# Patient Record
Sex: Female | Born: 1995 | Race: White | Hispanic: No | Smoking: Former smoker
Health system: Southern US, Community
[De-identification: ages and names within clinical notes are randomized; demographics above are authoritative.]

## PROBLEM LIST (undated history)

## (undated) ENCOUNTER — Inpatient Hospital Stay (HOSPITAL_COMMUNITY): Payer: Self-pay

## (undated) ENCOUNTER — Ambulatory Visit (HOSPITAL_COMMUNITY)
Admission: EM | Disposition: A | Discharge status: Other Acute Inpatient Hospital | Payer: PRIVATE HEALTH INSURANCE | Source: Ambulatory Visit

## (undated) DIAGNOSIS — F419 Anxiety disorder, unspecified: Secondary | ICD-10-CM

## (undated) DIAGNOSIS — F32A Depression, unspecified: Secondary | ICD-10-CM

## (undated) DIAGNOSIS — R519 Headache, unspecified: Secondary | ICD-10-CM

## (undated) DIAGNOSIS — D649 Anemia, unspecified: Secondary | ICD-10-CM

## (undated) DIAGNOSIS — F909 Attention-deficit hyperactivity disorder, unspecified type: Secondary | ICD-10-CM

## (undated) DIAGNOSIS — F319 Bipolar disorder, unspecified: Secondary | ICD-10-CM

## (undated) DIAGNOSIS — F329 Major depressive disorder, single episode, unspecified: Secondary | ICD-10-CM

## (undated) HISTORY — PX: ADENOIDECTOMY: SUR15

## (undated) HISTORY — PX: TONGUE SURGERY: SHX810

## (undated) HISTORY — PX: TONSILLECTOMY: SUR1361

---
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
## 2017-02-14 ENCOUNTER — Encounter (HOSPITAL_COMMUNITY): Payer: Self-pay | Admitting: Emergency Medicine

## 2017-02-14 ENCOUNTER — Emergency Department (HOSPITAL_COMMUNITY)
Admission: EM | Admit: 2017-02-14 | Discharge: 2017-02-14 | Disposition: A | Discharge status: Home / Self Care | Payer: No Typology Code available for payment source | Attending: Emergency Medicine | Admitting: Emergency Medicine

## 2017-02-14 DIAGNOSIS — N939 Abnormal uterine and vaginal bleeding, unspecified: Secondary | ICD-10-CM

## 2017-02-14 DIAGNOSIS — N898 Other specified noninflammatory disorders of vagina: Secondary | ICD-10-CM | POA: Diagnosis present

## 2017-02-14 DIAGNOSIS — B9689 Other specified bacterial agents as the cause of diseases classified elsewhere: Secondary | ICD-10-CM | POA: Diagnosis not present

## 2017-02-14 DIAGNOSIS — N760 Acute vaginitis: Secondary | ICD-10-CM | POA: Insufficient documentation

## 2017-02-14 DIAGNOSIS — F17200 Nicotine dependence, unspecified, uncomplicated: Secondary | ICD-10-CM | POA: Insufficient documentation

## 2017-02-14 DIAGNOSIS — N938 Other specified abnormal uterine and vaginal bleeding: Secondary | ICD-10-CM | POA: Insufficient documentation

## 2017-02-14 LAB — BASIC METABOLIC PANEL
ANION GAP: 9 (ref 5–15)
BUN: 8 mg/dL (ref 6–20)
CALCIUM: 9.9 mg/dL (ref 8.9–10.3)
CHLORIDE: 106 mmol/L (ref 101–111)
CO2: 25 mmol/L (ref 22–32)
Creatinine, Ser: 0.67 mg/dL (ref 0.44–1.00)
GFR calc Af Amer: >60 mL/min (ref >60)
GFR calc non Af Amer: >60 mL/min (ref >60)
GLUCOSE: 84 mg/dL (ref 65–99)
Potassium: 4.2 mmol/L (ref 3.5–5.1)
Sodium: 140 mmol/L (ref 135–145)

## 2017-02-14 LAB — URINALYSIS, ROUTINE W REFLEX MICROSCOPIC
BACTERIA UA: NONE SEEN
Bilirubin Urine: NEGATIVE
Glucose, UA: NEGATIVE mg/dL
KETONES UR: NEGATIVE mg/dL
Nitrite: NEGATIVE
Protein, ur: NEGATIVE mg/dL
Specific Gravity, Urine: 1.021 (ref 1.005–1.030)
pH: 5 (ref 5.0–8.0)

## 2017-02-14 LAB — WET PREP, GENITAL
Sperm: NONE SEEN
Trich, Wet Prep: NONE SEEN
Yeast Wet Prep HPF POC: NONE SEEN

## 2017-02-14 LAB — CBC
HEMATOCRIT: 38.3 % (ref 36.0–46.0)
HEMOGLOBIN: 12.9 g/dL (ref 12.0–15.0)
MCH: 29.4 pg (ref 26.0–34.0)
MCHC: 33.7 g/dL (ref 30.0–36.0)
MCV: 87.2 fL (ref 78.0–100.0)
Platelets: 245 10*3/uL (ref 150–400)
RBC: 4.39 MIL/uL (ref 3.87–5.11)
RDW: 13.1 % (ref 11.5–15.5)
WBC: 4.8 10*3/uL (ref 4.0–10.5)

## 2017-02-14 LAB — POC URINE PREG, ED: PREG TEST UR: NEGATIVE

## 2017-02-14 MED ORDER — METRONIDAZOLE 500 MG PO TABS: 500.0000 mg | ORAL_TABLET | Freq: Two times a day (BID) | ORAL | 0 refills | Status: DC

## 2017-02-14 NOTE — ED Provider Notes (Signed)
[facility]-EMERGENCY DEPT Provider Note   CSN: [id number] Arrival date & time: 02/14/17  1225  By signing my name below, I, Teofilo Pod, attest that this documentation has been prepared under the direction and in the presence of Melburn Hake, [place]. Electronically Signed: Teofilo Pod, ED Scribe. 02/14/2017. 3:11 PM.    History   Chief Complaint Chief Complaint  Patient presents with  . Vaginal Discharge  . Dysuria   The history is provided by the patient. No language interpreter was used.   HPI Comments:  Nicole Harris is a 21 y.o. female who presents to the Emergency Department complaining of a intermittent vaginal bleeding that began today. Pt states that 3 days ago she was having a malodorous vaginal discharge which has since resolved, then she had mild vaginal pain 2 days ago which has resolved. Pt states that she only sees blood when she urinates but reports having mild spotting today. Pt saw her PCP yesterday, had a pelvic exam and UA and was told that she had white blood cells and blood in her urine. Pt is sexually active with 1 partner, and does not use condoms. LNMP began 01/28/2017. Pt takes birth control pill. No alleviating factors noted for current symptoms. Pt denies vaginal pain, fever, chest pain, abdominal pain, nausea, vomiting, diarrhea, constipation, dysuria.   Past Medical History:  Diagnosis Date  . ADHD (attention deficit hyperactivity disorder)   . Anemia   . Anxiety   . Depression     There are no active problems to display for this patient.   Past Surgical History:  Procedure Laterality Date  . ADENOIDECTOMY    . TONGUE SURGERY    . TONSILLECTOMY      OB History    No data available       Home Medications    Prior to Admission medications   Medication Sig Start Date End Date Taking? Authorizing Provider  cholecalciferol (VITAMIN D) 1000 UNITS tablet Take 1,000 Units by mouth daily.    Historical Provider, MD  ferrous sulfate 325  (65 FE) MG tablet Take 325 mg by mouth daily with breakfast.     Historical Provider, MD  ibuprofen (ADVIL,MOTRIN) 200 MG tablet Take 400 mg by mouth every 6 (six) hours as needed for moderate pain.    Historical Provider, MD  lisdexamfetamine (VYVANSE) 70 MG capsule Take 70 mg by mouth daily.    Historical Provider, MD  metroNIDAZOLE (FLAGYL) 500 MG tablet Take 1 tablet (500 mg total) by mouth 2 (two) times daily. 02/14/17   Barrett Henle, PA-C    Family History No family history on file.  Social History Social History  Substance Use Topics  . Smoking status: Current Some Day Smoker  . Smokeless tobacco: Never Used  . Alcohol use No     Allergies   Patient has no known allergies.   Review of Systems Review of Systems  Constitutional: Negative for fever.  Cardiovascular: Negative for chest pain.  Gastrointestinal: Negative for abdominal pain, constipation, diarrhea, nausea and vomiting.  Genitourinary: Positive for vaginal bleeding and vaginal discharge. Negative for dysuria and vaginal pain.  All other systems reviewed and are negative.    Physical Exam Updated Vital Signs BP 106/70   Pulse 75   Temp 98.8 F (37.1 C) (Oral)   Resp 16   SpO2 99%   Physical Exam  Constitutional: She appears well-developed and well-nourished. No distress.  HENT:  Head: Normocephalic and atraumatic.  Mouth/Throat: Oropharynx is clear  and moist. No oropharyngeal exudate.  No oral lesion.  Eyes: Conjunctivae and EOM are normal. Right eye exhibits no discharge. Left eye exhibits no discharge. No scleral icterus.  Neck: Normal range of motion. Neck supple.  Cardiovascular: Normal rate, regular rhythm, normal heart sounds and intact distal pulses.   No murmur heard. Pulmonary/Chest: Effort normal and breath sounds normal. No respiratory distress. She has no wheezes. She has no rales. She exhibits no tenderness.  Abdominal: Soft. She exhibits no distension and no mass. There is no  tenderness. There is no rebound and no guarding. No hernia.  Musculoskeletal: She exhibits no edema.  Neurological: She is alert.  Skin: Skin is warm and dry.  Psychiatric: She has a normal mood and affect.  Nursing note and vitals reviewed.  Pelvic exam: normal external genitalia, vulva, vagina, cervix, uterus and adnexa, VULVA: normal appearing vulva with no masses, tenderness or lesions, VAGINA: normal appearing vagina with normal color, no lesions, small amount of bloody discharge noted in vaginal vault, CERVIX: normal appearing cervix without discharge or lesions, WET MOUNT done - results: clue cells, white blood cells, DNA probe for chlamydia and GC obtained, UTERUS: uterus is normal size, shape, consistency and nontender, ADNEXA: normal adnexa in size, nontender and no masses, exam chaperoned by female tech.   ED Treatments / Results  DIAGNOSTIC STUDIES:  Oxygen Saturation is 99% on RA, normal by my interpretation.    COORDINATION OF CARE:  3:10 PM Will perform pelvic exam. Discussed treatment plan with pt at bedside and pt agreed to plan.   Labs (all labs ordered are listed, but only abnormal results are displayed) Labs Reviewed  WET PREP, GENITAL - Abnormal; Notable for the following:       Result Value   Clue Cells Wet Prep HPF POC PRESENT (*)    WBC, Wet Prep HPF POC MODERATE (*)    All other components within normal limits  URINALYSIS, ROUTINE W REFLEX MICROSCOPIC - Abnormal; Notable for the following:    APPearance CLOUDY (*)    Hgb urine dipstick MODERATE (*)    Leukocytes, UA SMALL (*)    Squamous Epithelial / LPF 6-30 (*)    All other components within normal limits  URINE CULTURE  BASIC METABOLIC PANEL  CBC  RPR  HIV ANTIBODY (ROUTINE TESTING)  POC URINE PREG, ED  GC/CHLAMYDIA PROBE AMP ([place]) NOT AT [facility]    EKG  EKG Interpretation None       Radiology No results found.  Procedures Procedures (including critical care time)  Medications  Ordered in ED Medications - No data to display   Initial Impression / Assessment and Plan / ED Course  I have reviewed the triage vital signs and the nursing notes.  Pertinent labs & imaging results that were available during my care of the patient were reviewed by me and considered in my medical decision making (see chart for details).    Pt presents with vaginal bleeding/spotting, intermittent malodorous vaginal discharge which has since resolved. Denies fever, abdominal pain, vomiting. VSS. Exam unremarkable, no abdominal or CVA tenderness. Pelvic exam showed small amount of blood in vaginal vault, remaining exam unremarkable, no CMT or adnexal tenderness. Pregnancy negative. Labs unremarkable. UA positive for moderate hgb, small leuks, 6-30 epithelial cells. I do not suspect UTI, pt without urinary sxs, no signs/sxs concerning for kidney stone/pyelo. Wet prep positive for clue cells and WBCs. Results pending for GC/chalmydia, HIV and RPR; discussed pending labs with pt. Suspect pt's vaginal  bleeding is likely due to menstrual cycle. Advised pt to follow up with gynecologist for follow up evaluation. Plan to d/c pt home with Flagyl and outpatient follow up. Discussed results and plan for d/c. Discussed return precautions.  Final Clinical Impressions(s) / ED Diagnoses   Final diagnoses:  Vaginal bleeding  BV (bacterial vaginosis)    New Prescriptions New Prescriptions   METRONIDAZOLE (FLAGYL) 500 MG TABLET    Take 1 tablet (500 mg total) by mouth 2 (two) times daily.  I personally performed the services described in this documentation, which was scribed in my presence. The recorded information has been reviewed and is accurate.     Satira Sarkicole Elizabeth MadisonvilleNadeau, [place]-C 02/14/17 1620    Derwood KaplanAnkit Nanavati, MD 02/14/17 2035

## 2017-02-14 NOTE — ED Triage Notes (Signed)
Has had a vag d/c with odor and bleeding saw a dr and she had a large amt white  Cells in ua today she had a lot of blood in her urine

## 2017-02-14 NOTE — Discharge Instructions (Signed)
Take your antibiotic as prescribed until completed. I recommend following up with the gynecologist listed below for follow up evaluation and further management of your abnormal vaginal bleeding.  Please return to the Emergency Department if symptoms worsen or new onset of fever, abdominal pain, pain with urination, worsening vaginal bleeding, lightheadedness, syncope, chest pain, difficulty breathing.   You have been tested for HIV, syphilis, chlamydia and gonorrhea.  These results will be available in approximately 3 days.  Please inform all sexual partners if you test positive for any of these diseases.

## 2017-02-14 NOTE — ED Notes (Signed)
Declined W/C at D/C and was escorted to lobby by RN. 

## 2017-02-15 LAB — GC/CHLAMYDIA PROBE AMP (~~LOC~~) NOT AT ARMC
CHLAMYDIA, DNA PROBE: NEGATIVE
NEISSERIA GONORRHEA: NEGATIVE

## 2017-02-15 LAB — RPR: RPR: REACTIVE — AB

## 2017-02-15 LAB — RPR, QUANT+TP ABS (REFLEX)
Rapid Plasma Reagin, Quant: 1:1 {titer} — ABNORMAL HIGH
T Pallidum Abs: NEGATIVE

## 2017-02-15 LAB — HIV ANTIBODY (ROUTINE TESTING W REFLEX): HIV SCREEN 4TH GENERATION: NONREACTIVE

## 2017-02-16 LAB — URINE CULTURE: Culture: 50000 — AB

## 2017-02-17 ENCOUNTER — Telehealth: Payer: Self-pay

## 2017-02-17 NOTE — Telephone Encounter (Signed)
No treatment needed for UC 02/14/17 Per Sharen Hecklaudia Gibbons [facility]

## 2017-08-02 ENCOUNTER — Encounter (HOSPITAL_COMMUNITY): Payer: Self-pay | Admitting: Family Medicine

## 2017-08-02 ENCOUNTER — Ambulatory Visit (HOSPITAL_COMMUNITY)
Admission: EM | Admit: 2017-08-02 | Discharge: 2017-08-02 | Disposition: A | Discharge status: Home / Self Care | Payer: Medicaid Other | Attending: Emergency Medicine | Admitting: Emergency Medicine

## 2017-08-02 DIAGNOSIS — R21 Rash and other nonspecific skin eruption: Secondary | ICD-10-CM

## 2017-08-02 MED ORDER — TRIAMCINOLONE ACETONIDE 0.1 % EX CREA: 1.0000 "application " | TOPICAL_CREAM | Freq: Two times a day (BID) | CUTANEOUS | 2 refills | Status: DC

## 2017-08-02 NOTE — ED Triage Notes (Signed)
Pt here for rash to face. sts that they bombed for bugs at her house a few days ago and she walked in too soon. Small bumps to face.

## 2017-08-02 NOTE — ED Provider Notes (Signed)
  [facility]   [id number] 08/02/17 Arrival Time: 1555  ASSESSMENT & PLAN:  1. Rash     Meds ordered this encounter  Medications  . triamcinolone cream (KENALOG) 0.1 %    Sig: Apply 1 application topically 2 (two) times daily.    Dispense:  30 g    Refill:  2    Order Specific Question:   Supervising Provider    Answer:   Domenick GongMORTENSON, ASHLEY [[id number]]    Reviewed expectations re: course of current medical issues. Questions answered. Outlined signs and symptoms indicating need for more acute intervention. Patient verbalized understanding. After Visit Summary given.   SUBJECTIVE:  Nicole Harris is a 21 y.o. female who presents with complaint of Rash to her face and arms. She does have history of bed bugs, states she had her house fumigated, but the itching worsened after the fumigation.  ROS: As per HPI, otherwise negative.   OBJECTIVE:  Vitals:   08/02/17 1625  BP: (!) 114/59  Pulse: 99  Resp: 18  Temp: 98 F (36.7 C)  SpO2: 100%     General appearance: alert; no distress HEENT: normocephalic; atraumatic; conjunctivae normal; TMs normal; nasal mucosa normal; oral mucosa normal Neck: supple,no cervical lymphadenopathy Lungs: clear to auscultation bilaterally Heart: regular rate and rhythm Abdomen: soft, non-tender; bowel sounds normal; no masses or organomegaly; no guarding or rebound tenderness Back: no CVA tenderness Extremities: no cyanosis or edema; symmetrical with no gross deformities Skin: warm and dry, diffuse, erythemic lesions right forearm. Neurologic: Grossly normal Psychological:  alert and cooperative; normal mood and affect   Labs Reviewed - No data to display  No results found.  No Known Allergies  PMHx, SurgHx, SocialHx, Medications, and Allergies were reviewed in the Visit Navigator and updated as appropriate.      Dorena BodoKennard, Andera Cranmer, NP 08/02/17 1721

## 2017-08-02 NOTE — Discharge Instructions (Signed)
Take an over the counter clairitin or zyrtec daily, along with the Triamcinolone cream. Apply to the affected area 2-3 times a day as needed. If symptoms persist past 2 weeks, follow up with your regular doctor or return to clinic.

## 2017-09-18 ENCOUNTER — Encounter (HOSPITAL_COMMUNITY): Payer: Self-pay

## 2017-09-18 ENCOUNTER — Inpatient Hospital Stay (HOSPITAL_COMMUNITY)
Admission: AD | Admit: 2017-09-18 | Discharge: 2017-09-18 | Disposition: A | Discharge status: Home / Self Care | Payer: PRIVATE HEALTH INSURANCE | Source: Ambulatory Visit | Attending: Obstetrics & Gynecology | Admitting: Obstetrics & Gynecology

## 2017-09-18 DIAGNOSIS — Z3202 Encounter for pregnancy test, result negative: Secondary | ICD-10-CM

## 2017-09-18 DIAGNOSIS — N912 Amenorrhea, unspecified: Secondary | ICD-10-CM

## 2017-09-18 LAB — URINALYSIS, ROUTINE W REFLEX MICROSCOPIC
Bilirubin Urine: NEGATIVE
GLUCOSE, UA: NEGATIVE mg/dL
HGB URINE DIPSTICK: NEGATIVE
Ketones, ur: NEGATIVE mg/dL
Leukocytes, UA: NEGATIVE
Nitrite: NEGATIVE
PH: 6 (ref 5.0–8.0)
PROTEIN: NEGATIVE mg/dL
SPECIFIC GRAVITY, URINE: 1.018 (ref 1.005–1.030)

## 2017-09-18 LAB — POCT PREGNANCY, URINE: Preg Test, Ur: NEGATIVE

## 2017-09-18 NOTE — MAU Provider Note (Signed)
[name] is a 21 y.o. No obstetric history on file. who present to MAU today for pregnancy test. Patient concerned b/c she hasn't had a period Since June, prior to then had regular periods. Stopped OCPs last October. States she is currently trying to conceive. Has had multiple negative HPTs. Denies abdominal pain, vaginal bleeding, vaginal discharge.   BP 120/69 (BP Location: Right Arm)   Pulse 92   Temp 98.2 F (36.8 C) (Oral)   Resp 17   Ht  (1.626 m)   Wt 124 lb (56.2 kg)   LMP  (LMP Unknown)   SpO2 100%   BMI 21.28 kg/m  CONSTITUTIONAL: Well-developed, well-nourished female in no acute distress.  CARDIOVASCULAR: Normal heart rate noted RESPIRATORY: Effort and breath sounds normal GASTROINTESTINAL:Soft, no distention noted.  No tenderness, rebound or guarding.  SKIN: Skin is warm and dry. No rash noted. Not diaphoretic. No erythema. No pallor. PSYCHIATRIC: Normal mood and affect. Normal behavior. Normal judgment and thought content.  MDM Medical screening exam complete Patient does not endorse any symptoms concerning for ectopic pregnancy or pregnancy related complication today.   A:  Amenorrhea Negative urine pregnancy test  P: Discharge home F/u with PCP for further evaluation of amenhorrhea  Judeth Horn, NP 09/18/2017 11:12 PM

## 2017-09-18 NOTE — MAU Note (Signed)
Pt states she has not had a period for several months. Pt states she has had several negative upts at home. Pt states she recently stopped the pill last October and periods were normal after that. Pt denies pain or abnormal discharge.

## 2017-09-18 NOTE — Discharge Instructions (Signed)
Secondary Amenorrhea Secondary amenorrhea is the stopping of menstrual flow for 3-6 months in a female who has previously had periods. There are many possible causes. Most of these causes are not serious. Usually, treating the underlying problem causing the loss of menses will return your periods to normal. What are the causes? Some common and uncommon causes of not menstruating include:  Malnutrition.  Low blood sugar (hypoglycemia).  Polycystic ovary disease.  Stress or fear.  Breastfeeding.  Hormone imbalance.  Ovarian failure.  Medicines.  Extreme obesity.  Cystic fibrosis.  Low body weight or drastic weight reduction from any cause.  Early menopause.  Removal of ovaries or uterus.  Contraceptives.  Illness.  Long-term (chronic) illnesses.  Cushing syndrome.  Thyroid problems.  Birth control pills, patches, or vaginal rings for birth control.  What increases the risk? You may be at greater risk of secondary amenorrhea if:  You have a family history of this condition.  You have an eating disorder.  You do athletic training.  How is this diagnosed? A diagnosis is made by your health care provider taking a medical history and doing a physical exam. This will include a pelvic exam to check for problems with your reproductive organs. Pregnancy must be ruled out. Often, numerous blood tests are done to measure different hormones in the body. Urine testing may be done. Specialized exams (ultrasound, CT scan, MRI, or hysteroscopy) may have to be done as well as measuring the body mass index (BMI). How is this treated? Treatment depends on the cause of the amenorrhea. If an eating disorder is present, this can be treated with an adequate diet and therapy. Chronic illnesses may improve with treatment of the illness. Amenorrhea may be corrected with medicines, lifestyle changes, or surgery. If the amenorrhea cannot be corrected, it is sometimes possible to create a  false menstruation with medicines. Follow these instructions at home:  Maintain a healthy diet.  Manage weight problems.  Exercise regularly but not excessively.  Get adequate sleep.  Manage stress.  Be aware of changes in your menstrual cycle. Keep a record of when your periods occur. Note the date your period starts, how long it lasts, and any problems. Contact a health care provider if: Your symptoms do not get better with treatment. This information is not intended to replace advice given to you by your health care provider. Make sure you discuss any questions you have with your health care provider. Document Released: 01/28/2007 Document Revised: 05/24/2016 Document Reviewed: 06/04/2013 Elsevier Interactive Patient Education  2018 Elsevier Inc.       [place] [facility] Providers    [facility] [employer] [facility]       Phone: [phone]  [facility] [employer] [facility] [place] Phone: [phone]  [facility] [employer] at [place]  Phone: [phone]  [facility] for [facility] at [facility]  Phone: [phone]  [facility] for [facility] [place]  Phone: [phone]  [facility]       Phone: [phone]  [facility]    Phone: [phone]   [facility])    Phone: [phone]  [facility]    Phone: [phone]  [facility]                                     Phone: [phone]  [facility]  Phone: [phone]  [facility]    Phone: [phone]  [facility]       Phone: [phone]   [facility]  Phone: [phone]  [facility]    Phone: [phone]  [facility] [place]   Phone: [phone]  [facility]      Phone:  [phone]  [facility]    Phone: [phone]

## 2018-04-17 ENCOUNTER — Encounter (HOSPITAL_COMMUNITY): Payer: Self-pay | Admitting: Emergency Medicine

## 2018-04-17 ENCOUNTER — Ambulatory Visit (HOSPITAL_COMMUNITY)
Admission: EM | Admit: 2018-04-17 | Discharge: 2018-04-17 | Disposition: A | Discharge status: Home / Self Care | Payer: PRIVATE HEALTH INSURANCE | Attending: Family Medicine | Admitting: Family Medicine

## 2018-04-17 ENCOUNTER — Other Ambulatory Visit: Payer: Self-pay

## 2018-04-17 DIAGNOSIS — F909 Attention-deficit hyperactivity disorder, unspecified type: Secondary | ICD-10-CM | POA: Diagnosis not present

## 2018-04-17 DIAGNOSIS — F17200 Nicotine dependence, unspecified, uncomplicated: Secondary | ICD-10-CM | POA: Diagnosis not present

## 2018-04-17 DIAGNOSIS — F419 Anxiety disorder, unspecified: Secondary | ICD-10-CM | POA: Insufficient documentation

## 2018-04-17 DIAGNOSIS — F329 Major depressive disorder, single episode, unspecified: Secondary | ICD-10-CM | POA: Insufficient documentation

## 2018-04-17 DIAGNOSIS — Z79899 Other long term (current) drug therapy: Secondary | ICD-10-CM | POA: Diagnosis not present

## 2018-04-17 DIAGNOSIS — N898 Other specified noninflammatory disorders of vagina: Secondary | ICD-10-CM | POA: Diagnosis not present

## 2018-04-17 LAB — POCT PREGNANCY, URINE: PREG TEST UR: NEGATIVE

## 2018-04-17 MED ORDER — METRONIDAZOLE 500 MG PO TABS: 500.0000 mg | ORAL_TABLET | Freq: Two times a day (BID) | ORAL | 0 refills | Status: DC

## 2018-04-17 NOTE — Discharge Instructions (Addendum)
You were treated empirically for bacterial vaginitis. Start flagyl as directed. Cytology sent, you will be contacted with any positive results that requires further treatment. Refrain from sexual activity and alcohol use for the next 7 days. Monitor for any worsening of symptoms, fever, abdominal pain, nausea, vomiting, to follow up for reevaluation.

## 2018-04-17 NOTE — ED Triage Notes (Signed)
Vaginal discharge and vaginal odor started 2 days ago.  Denies burning with urination.  Patient is certain this is bv

## 2018-04-17 NOTE — ED Provider Notes (Signed)
[facility]    CSN: [id number] Arrival date & time: 04/17/18  1810     History   Chief Complaint Chief Complaint  Patient presents with  . Vaginal Discharge    HPI Nicole Harris is a 22 y.o. female.   22 year old female comes in for 2-day history of vaginal discharge with odor.  Denies vaginal itching/irritation.  Denies urinary symptoms such as frequency, dysuria, hematuria.  Denies abdominal pain, nausea, vomiting.  Denies fever, chills, night sweats.  Patient is sexually active with one female partner, no condom use.  Patient states had similar symptoms in the past, was positive for BV.       Past Medical History:  Diagnosis Date  . ADHD (attention deficit hyperactivity disorder)   . Anemia   . Anxiety   . Depression     There are no active problems to display for this patient.   Past Surgical History:  Procedure Laterality Date  . ADENOIDECTOMY    . TONGUE SURGERY    . TONSILLECTOMY      OB History   None      Home Medications    Prior to Admission medications   Medication Sig Start Date End Date Taking? Authorizing Provider  cholecalciferol (VITAMIN D) 1000 UNITS tablet Take 1,000 Units by mouth daily.    [provider]  ferrous sulfate 325 (65 FE) MG tablet Take 325 mg by mouth daily with breakfast.     [provider]  ibuprofen (ADVIL,MOTRIN) 200 MG tablet Take 400 mg by mouth every 6 (six) hours as needed for moderate pain.    [provider]  metroNIDAZOLE (FLAGYL) 500 MG tablet Take 1 tablet (500 mg total) by mouth 2 (two) times daily. 04/17/18   Cathie Hoops, Jhoana Upham V, PA-C  triamcinolone cream (KENALOG) 0.1 % Apply 1 application topically 2 (two) times daily. 08/02/17   Dorena Bodo, NP    Family History Family History  Problem Relation Age of Onset  . Diabetes Paternal Grandmother     Social History Social History   Tobacco Use  . Smoking status: Current Some Day Smoker  . Smokeless tobacco: Never Used    Substance Use Topics  . Alcohol use: No  . Drug use: No     Allergies   Patient has no known allergies.   Review of Systems Review of Systems  Reason unable to perform ROS: See HPI as above.     Physical Exam Triage Vital Signs ED Triage Vitals  Enc Vitals Group     BP 04/17/18 1905 112/64     Pulse Rate 04/17/18 1905 78     Resp 04/17/18 1905 18     Temp 04/17/18 1905 98.1 F (36.7 C)     Temp Source 04/17/18 1905 Oral     SpO2 04/17/18 1905 100 %     Weight --      Height --      Head Circumference --      Peak Flow --      Pain Score 04/17/18 1902 0     Pain Loc --      Pain Edu? --      Excl. in GC? --    No data found.  Updated Vital Signs BP 112/64 (BP Location: Left Arm)   Pulse 78   Temp 98.1 F (36.7 C) (Oral)   Resp 18   LMP 04/08/2018   SpO2 100%   Physical Exam  Constitutional: She is oriented to person,  place, and time. She appears well-developed and well-nourished. No distress.  HENT:  Head: Normocephalic and atraumatic.  Eyes: Pupils are equal, round, and reactive to light. Conjunctivae are normal.  Neurological: She is alert and oriented to person, place, and time.  Skin: Skin is warm and dry.     UC Treatments / Results  Labs (all labs ordered are listed, but only abnormal results are displayed) Labs Reviewed  POCT PREGNANCY, URINE  URINE CYTOLOGY ANCILLARY ONLY    EKG None Radiology No results found.  Procedures Procedures (including critical care time)  Medications Ordered in UC Medications - No data to display   Initial Impression / Assessment and Plan / UC Course  I have reviewed the triage vital signs and the nursing notes.  Pertinent labs & imaging results that were available during my care of the patient were reviewed by me and considered in my medical decision making (see chart for details).    Patient was treated empirically for BV. Start flagyl as directed. Cytology sent, patient will be contacted with any  positive results that require additional treatment. Patient to refrain from sexual activity for the next 7 days. Return precautions given.    Final Clinical Impressions(s) / UC Diagnoses   Final diagnoses:  Vaginal discharge    ED Discharge Orders        Ordered    metroNIDAZOLE (FLAGYL) 500 MG tablet  2 times daily     04/17/18 1913         Lurline IdolYu, Sharlena Kristensen V, PA-C 04/17/18 1958

## 2018-04-18 LAB — URINE CYTOLOGY ANCILLARY ONLY
Chlamydia: NEGATIVE
Neisseria Gonorrhea: NEGATIVE
TRICH (WINDOWPATH): NEGATIVE

## 2018-04-22 LAB — URINE CYTOLOGY ANCILLARY ONLY: Candida vaginitis: NEGATIVE

## 2018-04-23 ENCOUNTER — Telehealth (HOSPITAL_COMMUNITY): Payer: Self-pay

## 2018-04-23 NOTE — Telephone Encounter (Signed)
Bacterial Vaginosis test is positive.  Prescription for metronidazole was given at the urgent care visit. Pt contacted regarding results. Answered all questions. Verbalized understanding.   

## 2018-06-05 ENCOUNTER — Ambulatory Visit (INDEPENDENT_AMBULATORY_CARE_PROVIDER_SITE_OTHER): Payer: PRIVATE HEALTH INSURANCE | Admitting: *Deleted

## 2018-06-05 ENCOUNTER — Encounter: Payer: Self-pay | Admitting: Family Medicine

## 2018-06-05 ENCOUNTER — Encounter: Payer: Self-pay | Admitting: General Practice

## 2018-06-05 DIAGNOSIS — Z3201 Encounter for pregnancy test, result positive: Secondary | ICD-10-CM | POA: Diagnosis not present

## 2018-06-05 DIAGNOSIS — Z3200 Encounter for pregnancy test, result unknown: Secondary | ICD-10-CM

## 2018-06-05 LAB — POCT PREGNANCY, URINE: PREG TEST UR: POSITIVE — AB

## 2018-06-05 NOTE — Progress Notes (Signed)
Pt informed of positive pregnancy test result.  Medication reconciliation completed. Pt advised to begin prenatal vitamins. LMP 04/14/18.  EDD 01/19/19. Pt questions answered. She will schedule prenatal care.

## 2018-07-10 ENCOUNTER — Ambulatory Visit (INDEPENDENT_AMBULATORY_CARE_PROVIDER_SITE_OTHER): Payer: Medicaid Other | Admitting: Obstetrics and Gynecology

## 2018-07-10 ENCOUNTER — Encounter: Payer: Self-pay | Admitting: Obstetrics and Gynecology

## 2018-07-10 ENCOUNTER — Other Ambulatory Visit (HOSPITAL_COMMUNITY)
Admission: RE | Admit: 2018-07-10 | Discharge: 2018-07-10 | Disposition: A | Discharge status: Home / Self Care | Payer: Medicaid Other | Source: Ambulatory Visit | Attending: Obstetrics and Gynecology | Admitting: Obstetrics and Gynecology

## 2018-07-10 DIAGNOSIS — O219 Vomiting of pregnancy, unspecified: Secondary | ICD-10-CM | POA: Insufficient documentation

## 2018-07-10 DIAGNOSIS — O23592 Infection of other part of genital tract in pregnancy, second trimester: Secondary | ICD-10-CM | POA: Insufficient documentation

## 2018-07-10 DIAGNOSIS — Z3402 Encounter for supervision of normal first pregnancy, second trimester: Secondary | ICD-10-CM | POA: Insufficient documentation

## 2018-07-10 DIAGNOSIS — B9689 Other specified bacterial agents as the cause of diseases classified elsewhere: Secondary | ICD-10-CM | POA: Insufficient documentation

## 2018-07-10 DIAGNOSIS — N760 Acute vaginitis: Secondary | ICD-10-CM | POA: Insufficient documentation

## 2018-07-10 HISTORY — DX: Vomiting of pregnancy, unspecified: O21.9

## 2018-07-10 HISTORY — DX: Encounter for supervision of normal first pregnancy, second trimester: Z34.02

## 2018-07-10 MED ORDER — PROMETHAZINE HCL 12.5 MG RE SUPP: 12.5000 mg | Freq: Four times a day (QID) | RECTAL | 1 refills | Status: DC | PRN

## 2018-07-10 MED ORDER — ONDANSETRON 4 MG PO TBDP: 4.0000 mg | ORAL_TABLET | Freq: Three times a day (TID) | ORAL | 0 refills | Status: DC | PRN

## 2018-07-10 MED ORDER — VITAFOL-NANO 18-0.6-0.4 MG PO TABS: 1.0000 | ORAL_TABLET | Freq: Every day | ORAL | 12 refills | Status: DC

## 2018-07-10 MED ORDER — VITAFOL GUMMIES 3.33-0.333-34.8 MG PO CHEW: 3.0000 | CHEWABLE_TABLET | Freq: Every day | ORAL | 12 refills | Status: DC

## 2018-07-10 NOTE — Patient Instructions (Signed)
Eating Plan for Hyperemesis Gravidarum Hyperemesis gravidarum is a severe form of morning sickness. Because this condition causes severe nausea and vomiting, it can lead to dehydration, malnutrition, and weight loss. One way to lessen the symptoms of nausea and vomiting is to follow the eating plan for hyperemesis gravidarum. It is often used along with prescribed medicines to control your symptoms. What can I do to relieve my symptoms? Listen to your body. Everyone is different and has different preferences. Find what works best for you. Take any of the following actions that are helpful to you:  Eat and drink slowly.  Eat 5-6 small meals daily instead of 3 large meals.  Eat crackers before you get out of bed in the morning.  Try having a snack in the middle of the night.  Starchy foods are usually tolerated well. Examples include cereal, toast, bread, potatoes, pasta, rice, and pretzels.  Ginger may help with nausea. Add  tsp ground ginger to hot tea or choose ginger tea.  Try drinking 100% fruit juice or an electrolyte drink. An electrolyte drink contains sodium, potassium, and chloride.  Continue to take your prenatal vitamins as told by your health care provider. If you are having trouble taking your prenatal vitamins, talk with your health care provider about different options.  Include at least 1 serving of protein with your meals and snacks. Protein options include meats or poultry, beans, nuts, eggs, and yogurt. Try eating a protein-rich snack before bed. Examples of these snacks include cheese and crackers or half of a peanut butter or turkey sandwich.  Consider eliminating foods that trigger your symptoms. These may include spicy foods, coffee, high-fat foods, very sweet foods, and acidic foods.  Try meals that have more protein combined with bland, salty, lower-fat, and dry foods, such as nuts, seeds, pretzels, crackers, and cereal.  Talk with your healthcare provider about  starting a supplement of vitamin B6.  Have fluids that are cold, clear, and carbonated or sour. Examples include lemonade, ginger ale, lemon-lime soda, ice water, and sparkling water.  Try lemon or mint tea.  Try brushing your teeth or using a mouth rinse after meals.  What should I avoid to reduce my symptoms? Avoiding some of the following things may help reduce your symptoms.  Foods with strong smells. Try eating meals in well-ventilated areas that are free of odors.  Drinking water or other beverages with meals. Try not to drink anything during the 30 minutes before and after your meals.  Drinking more than 1 cup of fluid at a time. Sometimes using a straw helps.  Fried or high-fat foods, such as butter and cream sauces.  Spicy foods.  Skipping meals as best as you can. Nausea can be more intense on an empty stomach. If you cannot tolerate food at that time, do not force it. Try sucking on ice chips or other frozen items, and make up for missed calories later.  Lying down within 2 hours after eating.  Environmental triggers. These may include smoky rooms, closed spaces, rooms with strong smells, warm or humid places, overly loud and noisy rooms, and rooms with motion or flickering lights.  Quick and sudden changes in your movement.  This information is not intended to replace advice given to you by your health care provider. Make sure you discuss any questions you have with your health care provider. Document Released: 10/14/2007 Document Revised: 08/15/2016 Document Reviewed: 07/17/2016 Elsevier Interactive Patient Education  2018 [employer] Morning Sickness Morning sickness   is when you feel sick to your stomach (nauseous) during pregnancy. This nauseous feeling may or may not come with vomiting. It often occurs in the morning but can be a problem any time of day. Morning sickness is most common during the first trimester, but it may continue throughout pregnancy. While  morning sickness is unpleasant, it is usually harmless unless you develop severe and continual vomiting (hyperemesis gravidarum). This condition requires more intense treatment. What are the causes? The cause of morning sickness is not completely known but seems to be related to normal hormonal changes that occur in pregnancy. What increases the risk? You are at greater risk if you:  Experienced nausea or vomiting before your pregnancy.  Had morning sickness during a previous pregnancy.  Are pregnant with more than one baby, such as twins.  How is this treated? Do not use any medicines (prescription, over-the-counter, or herbal) for morning sickness without first talking to your health care provider. Your health care provider may prescribe or recommend:  Vitamin B6 supplements.  Anti-nausea medicines.  The herbal medicine ginger.  Follow these instructions at home:  Only take over-the-counter or prescription medicines as directed by your health care provider.  Taking multivitamins before getting pregnant can prevent or decrease the severity of morning sickness in most women.  Eat a piece of dry toast or unsalted crackers before getting out of bed in the morning.  Eat five or six small meals a day.  Eat dry and bland foods (rice, baked potato). Foods high in carbohydrates are often helpful.  Do not drink liquids with your meals. Drink liquids between meals.  Avoid greasy, fatty, and spicy foods.  Get someone to cook for you if the smell of any food causes nausea and vomiting.  If you feel nauseous after taking prenatal vitamins, take the vitamins at night or with a snack.  Snack on protein foods (nuts, yogurt, cheese) between meals if you are hungry.  Eat unsweetened gelatins for desserts.  Wearing an acupressure wristband (worn for sea sickness) may be helpful.  Acupuncture may be helpful.  Do not smoke.  Get a humidifier to keep the air in your house free of  odors.  Get plenty of fresh air. Contact a health care provider if:  Your home remedies are not working, and you need medicine.  You feel dizzy or lightheaded.  You are losing weight. Get help right away if:  You have persistent and uncontrolled nausea and vomiting.  You pass out (faint). This information is not intended to replace advice given to you by your health care provider. Make sure you discuss any questions you have with your health care provider. Document Released: 02/07/2007 Document Revised: 05/24/2016 Document Reviewed: 06/03/2013 Elsevier Interactive Patient Education  2017 [employer] Second Trimester of Pregnancy The second trimester is from week 13 through week 28, month 4 through 6. This is often the time in pregnancy that you feel your best. Often times, morning sickness has lessened or quit. You may have more energy, and you may get hungry more often. Your unborn baby (fetus) is growing rapidly. At the end of the sixth month, he or she is about 9 inches long and weighs about 1 pounds. You will likely feel the baby move (quickening) between 18 and 20 weeks of pregnancy. Follow these instructions at home:  Avoid all smoking, herbs, and alcohol. Avoid drugs not approved by your doctor.  Do not use any tobacco products, including cigarettes, chewing tobacco, and electronic cigarettes.   If you need help quitting, ask your doctor. You may get counseling or other support to help you quit.  Only take medicine as told by your doctor. Some medicines are safe and some are not during pregnancy.  Exercise only as told by your doctor. Stop exercising if you start having cramps.  Eat regular, healthy meals.  Wear a good support bra if your breasts are tender.  Do not use hot tubs, steam rooms, or saunas.  Wear your seat belt when driving.  Avoid raw meat, uncooked cheese, and liter boxes and soil used by cats.  Take your prenatal vitamins.  Take 1500-2000 milligrams  of calcium daily starting at the 20th week of pregnancy until you deliver your baby.  Try taking medicine that helps you poop (stool softener) as needed, and if your doctor approves. Eat more fiber by eating fresh fruit, vegetables, and whole grains. Drink enough fluids to keep your pee (urine) clear or pale yellow.  Take warm water baths (sitz baths) to soothe pain or discomfort caused by hemorrhoids. Use hemorrhoid cream if your doctor approves.  If you have puffy, bulging veins (varicose veins), wear support hose. Raise (elevate) your feet for 15 minutes, 3-4 times a day. Limit salt in your diet.  Avoid heavy lifting, wear low heals, and sit up straight.  Rest with your legs raised if you have leg cramps or low back pain.  Visit your dentist if you have not gone during your pregnancy. Use a soft toothbrush to brush your teeth. Be gentle when you floss.  You can have sex (intercourse) unless your doctor tells you not to.  Go to your doctor visits. Get help if:  You feel dizzy.  You have mild cramps or pressure in your lower belly (abdomen).  You have a nagging pain in your belly area.  You continue to feel sick to your stomach (nauseous), throw up (vomit), or have watery poop (diarrhea).  You have bad smelling fluid coming from your vagina.  You have pain with peeing (urination). Get help right away if:  You have a fever.  You are leaking fluid from your vagina.  You have spotting or bleeding from your vagina.  You have severe belly cramping or pain.  You lose or gain weight rapidly.  You have trouble catching your breath and have chest pain.  You notice sudden or extreme puffiness (swelling) of your face, hands, ankles, feet, or legs.  You have not felt the baby move in over an hour.  You have severe headaches that do not go away with medicine.  You have vision changes. This information is not intended to replace advice given to you by your health care provider.  Make sure you discuss any questions you have with your health care provider. Document Released: 03/13/2010 Document Revised: 05/24/2016 Document Reviewed: 02/17/2013 Elsevier Interactive Patient Education  2017 [employer]  

## 2018-07-10 NOTE — Progress Notes (Signed)
  Subjective:    Nicole Harris is being seen today for her first obstetrical visit.  This is a planned pregnancy. She is at 3021w2d gestation. Her obstetrical history is significant for ADHD, Anemia, Anxiety, and Depression. Relationship with FOB: significant other, living together. Patient does intend to breast feed. Pregnancy history fully reviewed.  Patient reports possible vaginal infection. Yellow vaginal discharge.  Review of Systems:   Review of Systems  Constitutional: Negative.   HENT: Positive for ear pain ("in LT ear" ).   Eyes: Negative.   Respiratory: Negative.   Cardiovascular: Negative.   Gastrointestinal: Positive for nausea.  Endocrine: Negative.   Genitourinary: Positive for vaginal discharge ("yellow; possible infection").  Musculoskeletal: Negative.   Skin: Negative.   Allergic/Immunologic: Negative.   Neurological: Negative.   Hematological: Negative.   Psychiatric/Behavioral: Negative.     Objective:     BP 114/73   Pulse (!) 102   Wt 118 lb (53.5 kg)   LMP 04/08/2018   BMI 20.25 kg/m  Physical Exam  Nursing note and vitals reviewed. Constitutional: She is oriented to person, place, and time. She appears well-developed and well-nourished.  HENT:  Head: Normocephalic and atraumatic.  Right Ear: External ear normal.  Left Ear: External ear normal.  Nose: Nose normal.  Mouth/Throat: Oropharynx is clear and moist.  Eyes: Pupils are equal, round, and reactive to light. Conjunctivae and EOM are normal.  Neck: Normal range of motion. Neck supple.  Cardiovascular: Normal rate, regular rhythm, normal heart sounds and intact distal pulses.  Respiratory: Effort normal and breath sounds normal.  GI: Soft. Bowel sounds are normal.  Genitourinary:  Genitourinary Comments: Uterus: gravid, S=D, SE: cervix is smooth, pink, no lesions, small amt of thick, yellowish-white vaginal d/c -- WP, GC/CT done, closed/long/firm, no CMT or friability, no adnexal tenderness    Musculoskeletal: Normal range of motion.  Neurological: She is alert and oriented to person, place, and time. She has normal reflexes.  Skin: Skin is warm and dry.  Multiple tattoos  Psychiatric: She has a normal mood and affect. Her behavior is normal. Judgment and thought content normal.    Maternal Exam:  Abdomen: Patient reports no abdominal tenderness. Fetal presentation: no presenting part  Introitus: Normal vulva. Normal vagina.  Ferning test: not done.  Nitrazine test: not done. Amniotic fluid character: not assessed.  Pelvis: adequate for delivery.   Cervix: Cervix evaluated by sterile speculum exam and digital exam.     Fetal Exam Fetal Monitor Review: Mode: hand-held doppler probe.   Baseline rate: 158 bpm.      Assessment:    Pregnancy: G1P0 Patient Active Problem List   Diagnosis Date Noted  . Encounter for supervision of normal first pregnancy in second trimester 07/10/2018  . Nausea/vomiting in pregnancy 07/10/2018       Plan:   Wet prep done -- pending. Initial labs drawn. Prenatal vitamins. Problem list reviewed and updated. AFP3 discussed: requested. Role of ultrasound in pregnancy discussed; fetal survey: ordered. Amniocentesis discussed: not indicated. Follow up in 5-6 weeks. 100% of 50 min visit spent on counseling and coordination of care.     Raelyn MoraRolitta Ramal Eckhardt, CNM 07/10/2018

## 2018-07-13 LAB — CULTURE, OB URINE

## 2018-07-13 LAB — URINE CULTURE, OB REFLEX

## 2018-07-14 LAB — CERVICOVAGINAL ANCILLARY ONLY
BACTERIAL VAGINITIS: POSITIVE — AB
CHLAMYDIA, DNA PROBE: NEGATIVE
Candida vaginitis: NEGATIVE
Neisseria Gonorrhea: NEGATIVE

## 2018-07-15 ENCOUNTER — Telehealth: Payer: Self-pay | Admitting: Obstetrics and Gynecology

## 2018-07-15 NOTE — Telephone Encounter (Signed)
Will call again later to discuss Wet Prep results.  Nicole Harris, CNM 07/15/2018 9:04 AM

## 2018-07-16 ENCOUNTER — Other Ambulatory Visit: Payer: Self-pay | Admitting: Obstetrics and Gynecology

## 2018-07-16 DIAGNOSIS — N760 Acute vaginitis: Principal | ICD-10-CM

## 2018-07-16 DIAGNOSIS — B9689 Other specified bacterial agents as the cause of diseases classified elsewhere: Secondary | ICD-10-CM

## 2018-07-16 MED ORDER — METRONIDAZOLE 500 MG PO TABS: 500.0000 mg | ORAL_TABLET | Freq: Two times a day (BID) | ORAL | 0 refills | Status: DC

## 2018-07-16 NOTE — Progress Notes (Signed)
TC to notify patient of BV on WP from NOB visit. Pharmacy verified. Patient advised of dosing and importance of completing the course of abx. Patient had no questions or concerns about dx. Patient verbalized an understanding of the plan of care and agrees.   Raelyn MoraRolitta Jochebed Bills, CNM  07/16/2018 8:46 AM

## 2018-07-17 LAB — HEMOGLOBINOPATHY EVALUATION
HEMOGLOBIN A2 QUANTITATION: 2.5 % (ref 1.8–3.2)
HGB C: 0 %
HGB S: 0 %
HGB VARIANT: 0 %
Hemoglobin F Quantitation: 0 % (ref 0.0–2.0)
Hgb A: 97.5 % (ref 96.4–98.8)

## 2018-07-17 LAB — OBSTETRIC PANEL, INCLUDING HIV
ANTIBODY SCREEN: NEGATIVE
BASOS: 0 %
Basophils Absolute: 0 10*3/uL (ref 0.0–0.2)
EOS (ABSOLUTE): 0 10*3/uL (ref 0.0–0.4)
EOS: 0 %
HEP B S AG: NEGATIVE
HIV SCREEN 4TH GENERATION: NONREACTIVE
Hematocrit: 35.5 % (ref 34.0–46.6)
Hemoglobin: 12.3 g/dL (ref 11.1–15.9)
Immature Grans (Abs): 0 10*3/uL (ref 0.0–0.1)
Immature Granulocytes: 0 %
LYMPHS ABS: 1.6 10*3/uL (ref 0.7–3.1)
Lymphs: 24 %
MCH: 30.4 pg (ref 26.6–33.0)
MCHC: 34.6 g/dL (ref 31.5–35.7)
MCV: 88 fL (ref 79–97)
MONOS ABS: 0.4 10*3/uL (ref 0.1–0.9)
Monocytes: 6 %
NEUTROS ABS: 4.7 10*3/uL (ref 1.4–7.0)
Neutrophils: 70 %
PLATELETS: 283 10*3/uL (ref 150–450)
RBC: 4.04 x10E6/uL (ref 3.77–5.28)
RDW: 15.1 % (ref 12.3–15.4)
RH TYPE: POSITIVE
RPR Ser Ql: NONREACTIVE
Rubella Antibodies, IGG: 2.2 index (ref >0.99)
WBC: 6.7 10*3/uL (ref 3.4–10.8)

## 2018-07-17 LAB — SMN1 COPY NUMBER ANALYSIS (SMA CARRIER SCREENING)

## 2018-07-17 LAB — CYSTIC FIBROSIS MUTATION 97: Interpretation: NOT DETECTED

## 2018-08-06 ENCOUNTER — Ambulatory Visit (INDEPENDENT_AMBULATORY_CARE_PROVIDER_SITE_OTHER): Payer: Medicaid Other | Admitting: Physician Assistant

## 2018-08-07 ENCOUNTER — Encounter (HOSPITAL_COMMUNITY): Payer: Self-pay

## 2018-08-14 ENCOUNTER — Other Ambulatory Visit (HOSPITAL_COMMUNITY): Payer: Self-pay | Admitting: *Deleted

## 2018-08-14 ENCOUNTER — Ambulatory Visit (HOSPITAL_COMMUNITY)
Admission: RE | Admit: 2018-08-14 | Discharge: 2018-08-14 | Disposition: A | Discharge status: Home / Self Care | Payer: Medicaid Other | Source: Ambulatory Visit | Attending: Obstetrics and Gynecology | Admitting: Obstetrics and Gynecology

## 2018-08-14 DIAGNOSIS — Z3402 Encounter for supervision of normal first pregnancy, second trimester: Secondary | ICD-10-CM | POA: Diagnosis present

## 2018-08-14 DIAGNOSIS — Z363 Encounter for antenatal screening for malformations: Secondary | ICD-10-CM | POA: Diagnosis not present

## 2018-08-14 DIAGNOSIS — Z3A16 16 weeks gestation of pregnancy: Secondary | ICD-10-CM

## 2018-08-14 DIAGNOSIS — Z362 Encounter for other antenatal screening follow-up: Secondary | ICD-10-CM

## 2018-08-21 ENCOUNTER — Encounter: Payer: Medicaid Other | Admitting: Obstetrics and Gynecology

## 2018-08-21 ENCOUNTER — Telehealth: Payer: Self-pay | Admitting: General Practice

## 2018-08-21 ENCOUNTER — Encounter: Payer: Self-pay | Admitting: General Practice

## 2018-08-21 NOTE — Telephone Encounter (Signed)
Patient missed OB appt this morning.  Left message on VM for patient to give our office a call back to reschedule.  Letter will be mailed.

## 2018-09-03 ENCOUNTER — Telehealth: Payer: Self-pay | Admitting: General Practice

## 2018-09-03 NOTE — Telephone Encounter (Signed)
Called patient to schedule follow up OB. Patient hasn't been seen since July;was a No Show for last appt.  Spoke with patient regarding appt scheduled for 09/25/18 at 4:30pm.  Patient stated that she has been trying to contact us being that her phone has been off.  Encouraged patient to keep appts.

## 2018-09-18 ENCOUNTER — Ambulatory Visit (HOSPITAL_COMMUNITY)
Admission: RE | Admit: 2018-09-18 | Discharge: 2018-09-18 | Disposition: A | Discharge status: Home / Self Care | Payer: PRIVATE HEALTH INSURANCE | Source: Ambulatory Visit | Attending: Obstetrics and Gynecology | Admitting: Obstetrics and Gynecology

## 2018-09-18 DIAGNOSIS — Z3A21 21 weeks gestation of pregnancy: Secondary | ICD-10-CM | POA: Diagnosis not present

## 2018-09-18 DIAGNOSIS — Z362 Encounter for other antenatal screening follow-up: Secondary | ICD-10-CM | POA: Insufficient documentation

## 2018-09-25 ENCOUNTER — Ambulatory Visit (INDEPENDENT_AMBULATORY_CARE_PROVIDER_SITE_OTHER): Payer: Medicaid Other | Admitting: Obstetrics and Gynecology

## 2018-09-25 ENCOUNTER — Encounter: Payer: Self-pay | Admitting: Obstetrics and Gynecology

## 2018-09-25 DIAGNOSIS — Z23 Encounter for immunization: Secondary | ICD-10-CM

## 2018-09-25 DIAGNOSIS — Z3402 Encounter for supervision of normal first pregnancy, second trimester: Secondary | ICD-10-CM

## 2018-09-25 NOTE — Patient Instructions (Addendum)
Influenza (Flu) Vaccine (Inactivated or Recombinant): What You Need to Know 1. Why get vaccinated? Influenza ("flu") is a contagious disease that spreads around the [place] every year, usually between October and May. Flu is caused by influenza viruses, and is spread mainly by coughing, sneezing, and close contact. Anyone can get flu. Flu strikes suddenly and can last several days. Symptoms vary by age, but can include:  fever/chills  sore throat  muscle aches  fatigue  cough  headache  runny or stuffy nose  Flu can also lead to pneumonia and blood infections, and cause diarrhea and seizures in children. If you have a medical condition, such as heart or lung disease, flu can make it worse. Flu is more dangerous for some people. Infants and young children, people 65 years of age and older, pregnant women, and people with certain health conditions or a weakened immune system are at greatest risk. Each year thousands of people in the [place] die from flu, and many more are hospitalized. Flu vaccine can:  keep you from getting flu,  make flu less severe if you do get it, and  keep you from spreading flu to your family and other people. 2. Inactivated and recombinant flu vaccines A dose of flu vaccine is recommended every flu season. Children 6 months through 8 years of age may need two doses during the same flu season. Everyone else needs only one dose each flu season. Some inactivated flu vaccines contain a very small amount of a mercury-based preservative called thimerosal. Studies have not shown thimerosal in vaccines to be harmful, but flu vaccines that do not contain thimerosal are available. There is no live flu virus in flu shots. They cannot cause the flu. There are many flu viruses, and they are always changing. Each year a new flu vaccine is made to protect against three or four viruses that are likely to cause disease in the upcoming flu season. But even when the  vaccine doesn't exactly match these viruses, it may still provide some protection. Flu vaccine cannot prevent:  flu that is caused by a virus not covered by the vaccine, or  illnesses that look like flu but are not.  It takes about 2 weeks for protection to develop after vaccination, and protection lasts through the flu season. 3. Some people should not get this vaccine Tell the person who is giving you the vaccine:  If you have any severe, life-threatening allergies. If you ever had a life-threatening allergic reaction after a dose of flu vaccine, or have a severe allergy to any part of this vaccine, you may be advised not to get vaccinated. Most, but not all, types of flu vaccine contain a small amount of egg protein.  If you ever had Guillain-Barr Syndrome (also called GBS). Some people with a history of GBS should not get this vaccine. This should be discussed with your doctor.  If you are not feeling well. It is usually okay to get flu vaccine when you have a mild illness, but you might be asked to come back when you feel better.  4. Risks of a vaccine reaction With any medicine, including vaccines, there is a chance of reactions. These are usually mild and go away on their own, but serious reactions are also possible. Most people who get a flu shot do not have any problems with it. Minor problems following a flu shot include:  soreness, redness, or swelling where the shot was given  hoarseness  sore,   red or itchy eyes  cough  fever  aches  headache  itching  fatigue  If these problems occur, they usually begin soon after the shot and last 1 or 2 days. More serious problems following a flu shot can include the following:  There may be a small increased risk of Guillain-Barre Syndrome (GBS) after inactivated flu vaccine. This risk has been estimated at 1 or 2 additional cases per million people vaccinated. This is much lower than the risk of severe complications from  flu, which can be prevented by flu vaccine.  Young children who get the flu shot along with pneumococcal vaccine (PCV13) and/or DTaP vaccine at the same time might be slightly more likely to have a seizure caused by fever. Ask your doctor for more information. Tell your doctor if a child who is getting flu vaccine has ever had a seizure.  Problems that could happen after any injected vaccine:  People sometimes faint after a medical procedure, including vaccination. Sitting or lying down for about 15 minutes can help prevent fainting, and injuries caused by a fall. Tell your doctor if you feel dizzy, or have vision changes or ringing in the ears.  Some people get severe pain in the shoulder and have difficulty moving the arm where a shot was given. This happens very rarely.  Any medication can cause a severe allergic reaction. Such reactions from a vaccine are very rare, estimated at about 1 in a million doses, and would happen within a few minutes to a few hours after the vaccination. As with any medicine, there is a very remote chance of a vaccine causing a serious injury or death. The safety of vaccines is always being monitored. For more information, visit: www.cdc.gov/vaccinesafety/ 5. What if there is a serious reaction? What should I look for? Look for anything that concerns you, such as signs of a severe allergic reaction, very high fever, or unusual behavior. Signs of a severe allergic reaction can include hives, swelling of the face and throat, difficulty breathing, a fast heartbeat, dizziness, and weakness. These would start a few minutes to a few hours after the vaccination. What should I do?  If you think it is a severe allergic reaction or other emergency that can't wait, call 9-1-1 and get the person to the nearest hospital. Otherwise, call your doctor.  Reactions should be reported to the Vaccine Adverse Event Reporting System (VAERS). Your doctor should file this report, or you  can do it yourself through the VAERS web site at www.vaers.hhs.gov, or by calling 1-800-822-7967. ? VAERS does not give medical advice. 6. The National Vaccine Injury Compensation Program The National Vaccine Injury Compensation Program (VICP) is a federal program that was created to compensate people who may have been injured by certain vaccines. Persons who believe they may have been injured by a vaccine can learn about the program and about filing a claim by calling 1-800-338-2382 or visiting the VICP website at www.hrsa.gov/vaccinecompensation. There is a time limit to file a claim for compensation. 7. How can I learn more?  Ask your healthcare provider. He or she can give you the vaccine package insert or suggest other sources of information.  Call your local or state health department.  Contact the Centers for Disease Control and Prevention (CDC): ? Call 1-800-232-4636 (1-800-CDC-INFO) or ? Visit CDC's website at www.cdc.gov/flu Vaccine Information Statement, Inactivated Influenza Vaccine (08/06/2014) This information is not intended to replace advice given to you by your health care provider. Make sure   sure you discuss any questions you have with your health care provider. Document Released: 10/11/2006 Document Revised: 09/06/2016 Document Reviewed: 09/06/2016 Elsevier Interactive Patient Education  2017 [employer].   Second Trimester of Pregnancy The second trimester is from week 13 through week 28, month 4 through 6. This is often the time in pregnancy that you feel your best. Often times, morning sickness has lessened or quit. You may have more energy, and you may get hungry more often. Your unborn baby (fetus) is growing rapidly. At the end of the sixth month, he or she is about 9 inches long and weighs about 1 pounds. You will likely feel the baby move (quickening) between 18 and 20 weeks of pregnancy. Follow these instructions at home:  Avoid all smoking, herbs, and alcohol.  Avoid drugs not approved by your doctor.  Do not use any tobacco products, including cigarettes, chewing tobacco, and electronic cigarettes. If you need help quitting, ask your doctor. You may get counseling or other support to help you quit.  Only take medicine as told by your doctor. Some medicines are safe and some are not during pregnancy.  Exercise only as told by your doctor. Stop exercising if you start having cramps.  Eat regular, healthy meals.  Wear a good support bra if your breasts are tender.  Do not use hot tubs, steam rooms, or saunas.  Wear your seat belt when driving.  Avoid raw meat, uncooked cheese, and liter boxes and soil used by cats.  Take your prenatal vitamins.  Take 1500-2000 milligrams of calcium daily starting at the 20th week of pregnancy until you deliver your baby.  Try taking medicine that helps you poop (stool softener) as needed, and if your doctor approves. Eat more fiber by eating fresh fruit, vegetables, and whole grains. Drink enough fluids to keep your pee (urine) clear or pale yellow.  Take warm water baths (sitz baths) to soothe pain or discomfort caused by hemorrhoids. Use hemorrhoid cream if your doctor approves.  If you have puffy, bulging veins (varicose veins), wear support hose. Raise (elevate) your feet for 15 minutes, 3-4 times a day. Limit salt in your diet.  Avoid heavy lifting, wear low heals, and sit up straight.  Rest with your legs raised if you have leg cramps or low back pain.  Visit your dentist if you have not gone during your pregnancy. Use a soft toothbrush to brush your teeth. Be gentle when you floss.  You can have sex (intercourse) unless your doctor tells you not to.  Go to your doctor visits. Get help if:  You feel dizzy.  You have mild cramps or pressure in your lower belly (abdomen).  You have a nagging pain in your belly area.  You continue to feel sick to your stomach (nauseous), throw up (vomit), or  have watery poop (diarrhea).  You have bad smelling fluid coming from your vagina.  You have pain with peeing (urination). Get help right away if:  You have a fever.  You are leaking fluid from your vagina.  You have spotting or bleeding from your vagina.  You have severe belly cramping or pain.  You lose or gain weight rapidly.  You have trouble catching your breath and have chest pain.  You notice sudden or extreme puffiness (swelling) of your face, hands, ankles, feet, or legs.  You have not felt the baby move in over an hour.  You have severe headaches that do not go away with medicine.  You have vision changes. This information is not intended to replace advice given to you by your health care provider. Make sure you discuss any questions you have with your health care provider. Document Released: 03/13/2010 Document Revised: 05/24/2016 Document Reviewed: 02/17/2013 Elsevier Interactive Patient Education  2017 [employer].

## 2018-09-25 NOTE — Progress Notes (Signed)
   PRENATAL VISIT NOTE  Subjective:  Nicole Harris is a 22 y.o. G1P0 at [redacted]w[redacted]d being seen today for ongoing prenatal care.  She is currently monitored for the following issues for this low-risk pregnancy and has Encounter for supervision of normal first pregnancy in second trimester and Nausea/vomiting in pregnancy on their problem list.  Patient reports skin redness on legs after shaving (used new razor with cocoa butter strips).  Contractions: Not present. Vag. Bleeding: None.  Movement: Present. Denies leaking of fluid.   The following portions of the patient's history were reviewed and updated as appropriate: allergies, current medications, past family history, past medical history, past social history, past surgical history and problem list. Problem list updated.  Objective:   Vitals:   09/25/18 1627  BP: 133/61  Pulse: (!) 58  Weight: 127 lb 9.6 oz (57.9 kg)    Fetal Status: Fetal Heart Rate (bpm): 144   Movement: Present     General:  Alert, oriented and cooperative. Patient is in no acute distress.  Skin: Skin is warm and dry. No rash noted.   Cardiovascular: Normal heart rate noted  Respiratory: Normal respiratory effort, no problems with respiration noted  Abdomen: Soft, gravid, appropriate for gestational age.  Pain/Pressure: Present     Pelvic: Cervical exam deferred        Extremities: Normal range of motion.  Edema: None  Mental Status: Normal mood and affect. Normal behavior. Normal judgment and thought content.   Assessment and Plan:  Pregnancy: G1P0 at [redacted]w[redacted]d  1. Encounter for supervision of normal first pregnancy in second trimester  2. Need for immunization against influenza - Flu Vaccine QUAD 36+ mos IM  Preterm labor symptoms and general obstetric precautions including but not limited to vaginal bleeding, contractions, leaking of fluid and fetal movement were reviewed in detail with the patient. Please refer to After Visit Summary for other counseling  recommendations.  Return in about 4 weeks (around 10/23/2018) for Return OB visit.  Future Appointments  Date Time Provider Department Center  10/23/2018  9:50 AM Raelyn Mora, CNM [facility] None    Raelyn Mora, [place]

## 2018-10-23 ENCOUNTER — Encounter: Payer: Medicaid Other | Admitting: Obstetrics and Gynecology

## 2018-10-28 ENCOUNTER — Ambulatory Visit (INDEPENDENT_AMBULATORY_CARE_PROVIDER_SITE_OTHER): Payer: PRIVATE HEALTH INSURANCE | Admitting: Obstetrics & Gynecology

## 2018-10-28 VITALS — BP 106/62 | HR 80 | Wt 139.6 lb

## 2018-10-28 DIAGNOSIS — M549 Dorsalgia, unspecified: Secondary | ICD-10-CM

## 2018-10-28 DIAGNOSIS — Z3402 Encounter for supervision of normal first pregnancy, second trimester: Secondary | ICD-10-CM

## 2018-10-28 DIAGNOSIS — O9989 Other specified diseases and conditions complicating pregnancy, childbirth and the puerperium: Secondary | ICD-10-CM

## 2018-10-28 MED ORDER — CYCLOBENZAPRINE HCL 10 MG PO TABS: 10.0000 mg | ORAL_TABLET | Freq: Three times a day (TID) | ORAL | 1 refills | Status: DC | PRN

## 2018-10-28 NOTE — Patient Instructions (Signed)
TDaP Vaccine Pregnancy Get the Whooping Cough Vaccine While You Are Pregnant (CDC)  It is important for women to get the whooping cough vaccine in the third trimester of each pregnancy. Vaccines are the best way to prevent this disease. There are 2 different whooping cough vaccines. Both vaccines combine protection against whooping cough, tetanus and diphtheria, but they are for different age groups: Tdap: for everyone 11 years or older, including pregnant women  DTaP: for children 2 months through 6 years of age  You need the whooping cough vaccine during each of your pregnancies The recommended time to get the shot is during your 27th through 36th week of pregnancy, preferably during the earlier part of this time period. The Centers for Disease Control and Prevention (CDC) recommends that pregnant women receive the whooping cough vaccine for adolescents and adults (called Tdap vaccine) during the third trimester of each pregnancy. The recommended time to get the shot is during your 27th through 36th week of pregnancy, preferably during the earlier part of this time period. This replaces the original recommendation that pregnant women get the vaccine only if they had not previously received it. The American College of Obstetricians and Gynecologists and the American College of Nurse-Midwives support this recommendation.  You should get the whooping cough vaccine while pregnant to pass protection to your baby frame support disabled and/or not supported in this browser  Learn why Nicole Harris decided to get the whooping cough vaccine in her 3rd trimester of pregnancy and how her baby girl was born with some protection against the disease. Also available on YouTube. After receiving the whooping cough vaccine, your body will create protective antibodies (proteins produced by the body to fight off diseases) and pass some of them to your baby before birth. These antibodies provide your baby some short-term  protection against whooping cough in early life. These antibodies can also protect your baby from some of the more serious complications that come along with whooping cough. Your protective antibodies are at their highest about 2 weeks after getting the vaccine, but it takes time to pass them to your baby. So the preferred time to get the whooping cough vaccine is early in your third trimester. The amount of whooping cough antibodies in your body decreases over time. That is why CDC recommends you get a whooping cough vaccine during each pregnancy. Doing so allows each of your babies to get the greatest number of protective antibodies from you. This means each of your babies will get the best protection possible against this disease.  Getting the whooping cough vaccine while pregnant is better than getting the vaccine after you give birth Whooping cough vaccination during pregnancy is ideal so your baby will have short-term protection as soon as he is born. This early protection is important because your baby will not start getting his whooping cough vaccines until he is 2 months old. These first few months of life are when your baby is at greatest risk for catching whooping cough. This is also when he's at greatest risk for having severe, potentially life-threating complications from the infection. To avoid that gap in protection, it is best to get a whooping cough vaccine during pregnancy. You will then pass protection to your baby before he is born. To continue protecting your baby, he should get whooping cough vaccines starting at 2 months old. You may never have gotten the Tdap vaccine before and did not get it during this pregnancy. If so, you should make sure   to get the vaccine immediately after you give birth, before leaving the hospital or birthing center. It will take about 2 weeks before your body develops protection (antibodies) in response to the vaccine. Once you have protection from the vaccine,  you are less likely to give whooping cough to your newborn while caring for him. But remember, your baby will still be at risk for catching whooping cough from others. A recent study looked to see how effective Tdap was at preventing whooping cough in babies whose mothers got the vaccine while pregnant or in the hospital after giving birth. The study found that getting Tdap between 27 through 36 weeks of pregnancy is 85% more effective at preventing whooping cough in babies younger than 2 months old. Blood tests cannot tell if you need a whooping cough vaccine There are no blood tests that can tell you if you have enough antibodies in your body to protect yourself or your baby against whooping cough. Even if you have been sick with whooping cough in the past or previously received the vaccine, you still should get the vaccine during each pregnancy. Breastfeeding may pass some protective antibodies onto your baby By breastfeeding, you may pass some antibodies you have made in response to the vaccine to your baby. When you get a whooping cough vaccine during your pregnancy, you will have antibodies in your breast milk that you can share with your baby as soon as your milk comes in. However, your baby will not get protective antibodies immediately if you wait to get the whooping cough vaccine until after delivering your baby. This is because it takes about 2 weeks for your body to create antibodies. Learn more about the health benefits of breastfeeding.  

## 2018-10-28 NOTE — Progress Notes (Signed)
   PRENATAL VISIT NOTE  Subjective:  Nicole Harris is a 22 y.o. G1P0 at [redacted]w[redacted]d being seen today for ongoing prenatal care.  She is currently monitored for the following issues for this low-risk pregnancy and has Encounter for supervision of normal first pregnancy in second trimester and Nausea/vomiting in pregnancy on their problem list.  Patient reports pain on her tail bone.  Contractions: Irritability. Vag. Bleeding: None.  Movement: Present. Denies leaking of fluid.   The following portions of the patient's history were reviewed and updated as appropriate: allergies, current medications, past family history, past medical history, past social history, past surgical history and problem list. Problem list updated.  Objective:   Vitals:   10/28/18 1608  BP: 106/62  Pulse: 80  Weight: 139 lb 9.6 oz (63.3 kg)    Fetal Status: Fetal Heart Rate (bpm): 138 Fundal Height: 27 cm Movement: Present     General:  Alert, oriented and cooperative. Patient is in no acute distress.  Skin: Skin is warm and dry. No rash noted.   Cardiovascular: Normal heart rate noted  Respiratory: Normal respiratory effort, no problems with respiration noted  Abdomen: Soft, gravid, appropriate for gestational age.  Pain/Pressure: Absent     Pelvic: Cervical exam deferred        Extremities: Normal range of motion.  Edema: Trace  Mental Status: Normal mood and affect. Normal behavior. Normal judgment and thought content.   Assessment and Plan:  Pregnancy: G1P0 at [redacted]w[redacted]d  1. Back pain affecting pregnancy in second trimester Recommended Tylenol, warm compresses, possible chiropractor visit. Flexeril prescribed as needed. - cyclobenzaprine (FLEXERIL) 10 MG tablet; Take 1 tablet (10 mg total) by mouth every 8 (eight) hours as needed for muscle spasms.  Dispense: 30 tablet; Refill: 1  2. Encounter for supervision of normal first pregnancy in second trimester Preterm labor symptoms and general obstetric precautions  including but not limited to vaginal bleeding, contractions, leaking of fluid and fetal movement were reviewed in detail with the patient. Please refer to After Visit Summary for other counseling recommendations.  Return in about 2 weeks (around 11/11/2018) for 2 hr GTT, 3rd trimester labs, TDap, OB Visit.  Future Appointments  Date Time Provider Department Center  11/04/2018  8:30 AM [facility] None  11/13/2018  3:10 PM Raelyn Mora, CNM [facility] None    Jaynie Collins, MD

## 2018-11-04 ENCOUNTER — Other Ambulatory Visit: Payer: PRIVATE HEALTH INSURANCE | Admitting: *Deleted

## 2018-11-04 DIAGNOSIS — O99019 Anemia complicating pregnancy, unspecified trimester: Secondary | ICD-10-CM | POA: Insufficient documentation

## 2018-11-04 DIAGNOSIS — Z3402 Encounter for supervision of normal first pregnancy, second trimester: Secondary | ICD-10-CM

## 2018-11-04 NOTE — Progress Notes (Signed)
   Patient in clinic for 28 week labs. Pt declined Tdap today. Discussed importance of getting Tdap.  Clovis Pu, RN

## 2018-11-05 LAB — CBC
HEMOGLOBIN: 8.9 g/dL — AB (ref 11.1–15.9)
Hematocrit: 26.3 % — ABNORMAL LOW (ref 34.0–46.6)
MCH: 28.9 pg (ref 26.6–33.0)
MCHC: 33.8 g/dL (ref 31.5–35.7)
MCV: 85 fL (ref 79–97)
Platelets: 274 10*3/uL (ref 150–450)
RBC: 3.08 x10E6/uL — ABNORMAL LOW (ref 3.77–5.28)
RDW: 12 % — ABNORMAL LOW (ref 12.3–15.4)
WBC: 8.9 10*3/uL (ref 3.4–10.8)

## 2018-11-05 LAB — HIV ANTIBODY (ROUTINE TESTING W REFLEX): HIV SCREEN 4TH GENERATION: NONREACTIVE

## 2018-11-05 LAB — GLUCOSE TOLERANCE, 2 HOURS W/ 1HR
Glucose, 1 hour: 134 mg/dL (ref 65–179)
Glucose, 2 hour: 85 mg/dL (ref 65–152)
Glucose, Fasting: 75 mg/dL (ref 65–91)

## 2018-11-05 LAB — RPR: RPR Ser Ql: NONREACTIVE

## 2018-11-07 NOTE — Telephone Encounter (Signed)
-----   Message from Rolitta Dawson, CNM sent at 11/07/2018 11:38 AM EST ----- Please offer Feraheme infusion for Iron Deficiency Anemia in Pregnancy 

## 2018-11-07 NOTE — Telephone Encounter (Signed)
Patient called to inform her of low RBC at 8.9. Advised patient that the provider would like her to have an iron infusion. Pt reported that she should be taking iron tablets but stopped because she thought it was not safe for the baby. Pt has to speak with her stepmom regarding the iron infusion. Advised patient to call nurse back on Monday 11/10/18 regarding her decision.    Clovis Pu, RN

## 2018-11-11 NOTE — Telephone Encounter (Signed)
Left message for patient to call nurse back regarding iron infusion.  Clovis Pu, RN

## 2018-11-11 NOTE — Telephone Encounter (Signed)
-----   Message from Rolitta Dawson, CNM sent at 11/07/2018 11:38 AM EST ----- Please offer Feraheme infusion for Iron Deficiency Anemia in Pregnancy 

## 2018-11-12 NOTE — Telephone Encounter (Signed)
-----   Message from Rolitta Dawson, CNM sent at 11/07/2018 11:38 AM EST ----- Please offer Feraheme infusion for Iron Deficiency Anemia in Pregnancy 

## 2018-11-12 NOTE — Addendum Note (Signed)
Addended by: Clovis PuMARTIN, Rigdon Macomber L on: 11/12/2018 01:35 PM   Modules accepted: Orders, SmartSet

## 2018-11-12 NOTE — Telephone Encounter (Signed)
Patient advised that she is scheduled for Feraheme infusion on Wednesday 11/19/18 at 11 AM at [facility].  Clovis PuMartin, Jochebed Bills L, RN

## 2018-11-12 NOTE — Telephone Encounter (Signed)
-----   Message from Raelyn Moraolitta Dawson, [place] sent at 11/07/2018 11:38 AM EST ----- Please offer Feraheme infusion for Iron Deficiency Anemia in Pregnancy

## 2018-11-13 ENCOUNTER — Encounter: Payer: PRIVATE HEALTH INSURANCE | Admitting: Obstetrics and Gynecology

## 2018-11-14 ENCOUNTER — Encounter: Payer: Self-pay | Admitting: General Practice

## 2018-11-19 ENCOUNTER — Telehealth: Payer: Self-pay | Admitting: General Practice

## 2018-11-19 ENCOUNTER — Inpatient Hospital Stay (HOSPITAL_COMMUNITY): Admission: RE | Admit: 2018-11-19 | Payer: PRIVATE HEALTH INSURANCE | Source: Ambulatory Visit

## 2018-11-19 NOTE — Telephone Encounter (Signed)
Patient missed appt on 11/13/18.  Letter was mailed to patient on 11/14/18.  Called patient to schedule f/u OB appt today, 11/19/18, no answer.  Left message for patient to give our office a call back to schedule.  MyChart message sent to patient as well.

## 2018-12-31 NOTE — L&D Delivery Note (Signed)
Patient is a 23 y.o. now G1P1001 s/p NSVD at 1761w0d, who was admitted for SOL and postdates.  She progressed with augmentation to complete and pushed 1.5 hrs to deliver. FHR down to 60-80 bpm just before delivery. Dr. Erin FullingHarraway-Smith called to come for vacuum assist. NICU team called to the room for FHR tracing. Dr. Erin FullingHarraway-Smith placed vacuum with maternal pushing efforts to assist with delivery of head. Vacuum popped off. Dr. Erin FullingHarraway-Smith then delivered the fetal head with CNM doing Ritgen's maneuver. Head delivered with next pushing effort from mom, tight nuchal cord not reducible on perineum, body delivered and baby somersaulted to remove nuchal cord.   Cord immediately clamped and cut by CNM. Infant over to warmer where NICU team awaited to being NRP efforts. Placenta intact and spontaneous, bleeding minimal.  2nd degree laceration repaired without difficulty.  Baby stablized and brought back to mom for skin-to-skin. Mom and baby stable prior to transfer to postpartum. She plans on breastfeeding. She requests POPs for birth control.  Delivery Note At 6:34 AM a viable female was delivered via Vaginal, failed Vacuum (Extractor) (Presentation: OA).  APGAR: 1, 9; weight  .   Placenta status: spontaneous, intact via Tomasa BlaseSchultz.  Cord: 3 VC with no complications.  Cord pH: 7.5  Anesthesia: Epidural Episiotomy: None Lacerations: 2nd degree;Labial Suture Repair: 3.0 vicryl Est. Blood Loss (mL): 281  Mom to postpartum.  Baby to Couplet care / Skin to Skin.   Nicole Moraolitta Shane Melby, MSN, CNM 02/02/19, 7:06 AM

## 2019-01-05 ENCOUNTER — Encounter (HOSPITAL_COMMUNITY): Payer: Self-pay | Admitting: *Deleted

## 2019-01-05 ENCOUNTER — Inpatient Hospital Stay (HOSPITAL_COMMUNITY)
Admission: AD | Admit: 2019-01-05 | Discharge: 2019-01-06 | Disposition: A | Discharge status: Home / Self Care | Payer: No Typology Code available for payment source | Attending: Obstetrics and Gynecology | Admitting: Obstetrics and Gynecology

## 2019-01-05 ENCOUNTER — Other Ambulatory Visit: Payer: Self-pay

## 2019-01-05 DIAGNOSIS — Z87891 Personal history of nicotine dependence: Secondary | ICD-10-CM | POA: Insufficient documentation

## 2019-01-05 DIAGNOSIS — O471 False labor at or after 37 completed weeks of gestation: Secondary | ICD-10-CM

## 2019-01-05 DIAGNOSIS — Z3A37 37 weeks gestation of pregnancy: Secondary | ICD-10-CM | POA: Insufficient documentation

## 2019-01-05 NOTE — MAU Note (Signed)
Pt reports to MAU c/o ctxs every . No bleeding or LOF. No FM in the last hour (baby moved while in triage). FHR 135

## 2019-01-05 NOTE — MAU Note (Signed)
Urine in lab 

## 2019-01-06 ENCOUNTER — Encounter (HOSPITAL_COMMUNITY): Payer: Self-pay | Admitting: Obstetrics and Gynecology

## 2019-01-06 ENCOUNTER — Telehealth: Payer: Self-pay | Admitting: General Practice

## 2019-01-06 DIAGNOSIS — Z87891 Personal history of nicotine dependence: Secondary | ICD-10-CM | POA: Diagnosis not present

## 2019-01-06 DIAGNOSIS — O471 False labor at or after 37 completed weeks of gestation: Secondary | ICD-10-CM | POA: Diagnosis present

## 2019-01-06 DIAGNOSIS — Z3A37 37 weeks gestation of pregnancy: Secondary | ICD-10-CM | POA: Diagnosis not present

## 2019-01-06 LAB — CBC
HEMATOCRIT: 26.1 % — AB (ref 36.0–46.0)
Hemoglobin: 7.9 g/dL — ABNORMAL LOW (ref 12.0–15.0)
MCH: 25.2 pg — ABNORMAL LOW (ref 26.0–34.0)
MCHC: 30.3 g/dL (ref 30.0–36.0)
MCV: 83.1 fL (ref 80.0–100.0)
Platelets: 301 10*3/uL (ref 150–400)
RBC: 3.14 MIL/uL — ABNORMAL LOW (ref 3.87–5.11)
RDW: 14.4 % (ref 11.5–15.5)
WBC: 11.4 10*3/uL — ABNORMAL HIGH (ref 4.0–10.5)

## 2019-01-06 LAB — OB RESULTS CONSOLE GBS: GBS: POSITIVE

## 2019-01-06 MED ORDER — FERROUS SULFATE 325 (65 FE) MG PO TABS: 325.0000 mg | ORAL_TABLET | Freq: Two times a day (BID) | ORAL | 0 refills | Status: DC

## 2019-01-06 MED ORDER — SODIUM CHLORIDE 0.9 % IV SOLN
510.0000 mg | Freq: Once | INTRAVENOUS | Status: AC
  Administered 2019-01-06: 510 mg via INTRAVENOUS
  Filled 2019-01-06: qty 17

## 2019-01-06 NOTE — Telephone Encounter (Signed)
Patient was seen in MAU last night 01/05/19.  Attempted to call patient to scheduled follow up OB appointment for next week.  Left message on VM for patient to give our office a call back to schedule.

## 2019-01-06 NOTE — MAU Provider Note (Signed)
History     CSN: [id number]  Arrival date and time: 01/05/19 2305   First Provider Initiated Contact with Patient 01/06/19 0020      Chief Complaint  Patient presents with  . Contractions  . Decreased Fetal Movement   HPI  Ms.  Nicole Harris is a 23 y.o. year old G1P0 female at [redacted]w[redacted]d weeks gestation who presents to MAU reporting UC's every 5 mins since 2200 tonight. She rates them an 8-9/10. She denies VB or LOF. She also reports DFM since 1 hour before coming to MAU. She has had limited PNC at [facility] (last appt was 11/04/2018). She did not go get Feraheme infusion as prescribed in 10/2018, but she reports "taking the iron pills."  Past Medical History:  Diagnosis Date  . ADHD (attention deficit hyperactivity disorder)   . Anemia   . Anxiety   . Depression     Past Surgical History:  Procedure Laterality Date  . ADENOIDECTOMY    . TONGUE SURGERY    . TONSILLECTOMY      Family History  Problem Relation Age of Onset  . Diabetes Paternal Grandmother   . Drug abuse Mother   . Cancer Maternal Grandmother   . Thyroid disease Maternal Grandmother   . Ovarian cancer Maternal Grandmother   . Hypertension Paternal Grandfather     Social History   Tobacco Use  . Smoking status: Former Games developer  . Smokeless tobacco: Never Used  Substance Use Topics  . Alcohol use: No  . Drug use: No    Allergies: No Known Allergies  Medications Prior to Admission  Medication Sig Dispense Refill Last Dose  . Prenatal Vit-Fe Phos-FA-Omega (VITAFOL GUMMIES) 3.33-0.333-34.8 MG CHEW Chew 3 Pieces by mouth daily. 90 tablet 12 Past Month at Unknown time  . cyclobenzaprine (FLEXERIL) 10 MG tablet Take 1 tablet (10 mg total) by mouth every 8 (eight) hours as needed for muscle spasms. 30 tablet 1     Review of Systems Physical Exam   Blood pressure 118/78, pulse 95, temperature 97.8 F (36.6 C), temperature source Oral, resp. rate 18, weight 69.1 kg, last menstrual period  04/08/2018.  Physical Exam  Nursing note and vitals reviewed. Constitutional: She is oriented to person, place, and time. She appears well-developed and well-nourished.  HENT:  Head: Normocephalic.  Eyes: Pupils are equal, round, and reactive to light.  Neck: Normal range of motion.  Cardiovascular: Normal rate.  Respiratory: Effort normal.  GI: Soft.  Genitourinary:    Genitourinary Comments: Dilation: Closed Cervical Position: Posterior Exam by:: Latricia Heft, RN GBS obtained by RN   Musculoskeletal: Normal range of motion.  Neurological: She is alert and oriented to person, place, and time.  Skin: Skin is warm and dry.  Psychiatric: She has a normal mood and affect. Her behavior is normal. Judgment and thought content normal.    MAU Course  Procedures  MDM CBC Feraheme Infusion IV NST - FHR: 125 bpm / moderate variability / accels present / decels absent / TOCO: UI noted  Results for orders placed or performed during the hospital encounter of 01/05/19 (from the past 24 hour(s))  CBC     Status: Abnormal   Collection Time: 01/06/19 12:42 AM  Result Value Ref Range   WBC 11.4 (H) 4.0 - 10.5 K/uL   RBC 3.14 (L) 3.87 - 5.11 MIL/uL   Hemoglobin 7.9 (L) 12.0 - 15.0 g/dL   HCT 59.0 (L) 93.1 - 12.1 %   MCV 83.1 80.0 - 100.0  fL   MCH 25.2 (L) 26.0 - 34.0 pg   MCHC 30.3 30.0 - 36.0 g/dL   RDW 74.2 59.5 - 63.8 %   Platelets 301 150 - 400 K/uL    Assessment and Plan  False labor after 37 weeks of gestation without delivery - Plan: Discharge patient - Rx for FeSO4 BID sent - Information provided on anemia and pregnancy - Advised to make an appt with [facility] to continue [facility] - Patient verbalized an understanding of the plan of care and agrees.   Raelyn Mora, MSN, CNM 01/06/2019, 12:20 AM

## 2019-01-07 LAB — CULTURE, BETA STREP (GROUP B ONLY)

## 2019-01-08 ENCOUNTER — Telehealth: Payer: Self-pay | Admitting: *Deleted

## 2019-01-08 ENCOUNTER — Encounter: Payer: Self-pay | Admitting: Advanced Practice Midwife

## 2019-01-08 DIAGNOSIS — O99820 Streptococcus B carrier state complicating pregnancy: Secondary | ICD-10-CM | POA: Insufficient documentation

## 2019-01-08 HISTORY — DX: Streptococcus B carrier state complicating pregnancy: O99.820

## 2019-01-08 NOTE — Telephone Encounter (Signed)
Patient informed that appointment is 01/14/2019 at 1:00 PM. Patient advised to call clinic if she is unable to keep appointment. Clovis Pu, RN

## 2019-01-08 NOTE — Telephone Encounter (Signed)
-----   Message from [place], [place] sent at 01/07/2019  2:47 PM EST ----- Regarding: Iron Infusion Please call her to get her scheduled for another iron infusion in 1 week. Raelyn Mora, CNM

## 2019-01-08 NOTE — Telephone Encounter (Signed)
Patient advised that she will need another iron infusion. Appointment time and date to be scheduled.  Clovis Pu, RN

## 2019-01-14 ENCOUNTER — Ambulatory Visit (HOSPITAL_COMMUNITY)
Admission: RE | Admit: 2019-01-14 | Discharge: 2019-01-14 | Disposition: A | Discharge status: Home / Self Care | Payer: No Typology Code available for payment source | Source: Ambulatory Visit | Attending: Obstetrics and Gynecology | Admitting: Obstetrics and Gynecology

## 2019-01-14 DIAGNOSIS — O99013 Anemia complicating pregnancy, third trimester: Secondary | ICD-10-CM | POA: Diagnosis present

## 2019-01-14 DIAGNOSIS — O99019 Anemia complicating pregnancy, unspecified trimester: Secondary | ICD-10-CM

## 2019-01-14 MED ORDER — SODIUM CHLORIDE 0.9 % IV SOLN
510.0000 mg | Freq: Once | INTRAVENOUS | Status: AC
  Administered 2019-01-14: 510 mg via INTRAVENOUS
  Filled 2019-01-14: qty 17

## 2019-01-14 NOTE — Progress Notes (Signed)
Patient stated she has a fear of needles.  Patient was reclined back and after IV in place became very pale and diaphoretic.  BP 69/41, patient was given a drink and cool wet cloth to forehead.  Also given something to eat. BP was rechecked and came up to 103/67.  Patient stated she was feeling better, color better in face.

## 2019-01-14 NOTE — Discharge Instructions (Signed)

## 2019-01-16 ENCOUNTER — Ambulatory Visit (INDEPENDENT_AMBULATORY_CARE_PROVIDER_SITE_OTHER): Payer: PRIVATE HEALTH INSURANCE | Admitting: Obstetrics and Gynecology

## 2019-01-16 VITALS — BP 113/69 | HR 91 | Wt 152.0 lb

## 2019-01-16 DIAGNOSIS — Z3403 Encounter for supervision of normal first pregnancy, third trimester: Secondary | ICD-10-CM

## 2019-01-16 NOTE — Patient Instructions (Signed)
Braxton Hicks Contractions Contractions of the uterus can occur throughout pregnancy, but they are not always a sign that you are in labor. You may have practice contractions called Braxton Hicks contractions. These false labor contractions are sometimes confused with true labor. What are Braxton Hicks contractions? Braxton Hicks contractions are tightening movements that occur in the muscles of the uterus before labor. Unlike true labor contractions, these contractions do not result in opening (dilation) and thinning of the cervix. Toward the end of pregnancy (32-34 weeks), Braxton Hicks contractions can happen more often and may become stronger. These contractions are sometimes difficult to tell apart from true labor because they can be very uncomfortable. You should not feel embarrassed if you go to the hospital with false labor. Sometimes, the only way to tell if you are in true labor is for your health care provider to look for changes in the cervix. The health care provider will do a physical exam and may monitor your contractions. If you are not in true labor, the exam should show that your cervix is not dilating and your water has not broken. If there are no other health problems associated with your pregnancy, it is completely safe for you to be sent home with false labor. You may continue to have Braxton Hicks contractions until you go into true labor. How to tell the difference between true labor and false labor True labor  Contractions last 30-70 seconds.  Contractions become very regular.  Discomfort is usually felt in the top of the uterus, and it spreads to the lower abdomen and low back.  Contractions do not go away with walking.  Contractions usually become more intense and increase in frequency.  The cervix dilates and gets thinner. False labor  Contractions are usually shorter and not as strong as true labor contractions.  Contractions are usually irregular.  Contractions  are often felt in the front of the lower abdomen and in the groin.  Contractions may go away when you walk around or change positions while lying down.  Contractions get weaker and are shorter-lasting as time goes on.  The cervix usually does not dilate or become thin. Follow these instructions at home:   Take over-the-counter and prescription medicines only as told by your health care provider.  Keep up with your usual exercises and follow other instructions from your health care provider.  Eat and drink lightly if you think you are going into labor.  If Braxton Hicks contractions are making you uncomfortable: ? Change your position from lying down or resting to walking, or change from walking to resting. ? Sit and rest in a tub of warm water. ? Drink enough fluid to keep your urine pale yellow. Dehydration may cause these contractions. ? Do slow and deep breathing several times an hour.  Keep all follow-up prenatal visits as told by your health care provider. This is important. Contact a health care provider if:  You have a fever.  You have continuous pain in your abdomen. Get help right away if:  Your contractions become stronger, more regular, and closer together.  You have fluid leaking or gushing from your vagina.  You pass blood-tinged mucus (bloody show).  You have bleeding from your vagina.  You have low back pain that you never had before.  You feel your baby's head pushing down and causing pelvic pressure.  Your baby is not moving inside you as much as it used to. Summary  Contractions that occur before labor are   called Braxton Hicks contractions, false labor, or practice contractions.  Braxton Hicks contractions are usually shorter, weaker, farther apart, and less regular than true labor contractions. True labor contractions usually become progressively stronger and regular, and they become more frequent.  Manage discomfort from Braxton Hicks contractions  by changing position, resting in a warm bath, drinking plenty of water, or practicing deep breathing. This information is not intended to replace advice given to you by your health care provider. Make sure you discuss any questions you have with your health care provider. Document Released: 05/02/2017 Document Revised: 10/01/2017 Document Reviewed: 05/02/2017 Elsevier Interactive Patient Education  2019 Elsevier Inc.  

## 2019-01-16 NOTE — Progress Notes (Signed)
   PRENATAL VISIT NOTE  Subjective:  Nicole Harris is a 23 y.o. G1P0 at [redacted]w[redacted]d being seen today for ongoing prenatal care.  She is currently monitored for the following issues for this low-risk pregnancy and has Encounter for supervision of normal first pregnancy in second trimester; Nausea/vomiting in pregnancy; and GBS (group B Streptococcus carrier), +RV culture, currently pregnant on their problem list.  Patient reports no complaints. She reports having her Feraheme infusion on Wednesday 01/14/2019. She states she "passed out during the infusion, but has been doing well since then." Contractions: Irregular. Vag. Bleeding: None.  Movement: Present. Denies leaking of fluid.   The following portions of the patient's history were reviewed and updated as appropriate: allergies, current medications, past family history, past medical history, past social history, past surgical history and problem list. Problem list updated.  Objective:   Vitals:   01/16/19 1048  BP: 113/69  Pulse: 91  Weight: 152 lb (68.9 kg)    Fetal Status: Fetal Heart Rate (bpm): 142 Fundal Height: 36 cm Movement: Present     General:  Alert, oriented and cooperative. Patient is in no acute distress.  Skin: Skin is warm and dry. No rash noted.   Cardiovascular: Normal heart rate noted  Respiratory: Normal respiratory effort, no problems with respiration noted  Abdomen: Soft, gravid, appropriate for gestational age.  Pain/Pressure: Absent     Pelvic: Cervical exam deferred        Extremities: Normal range of motion.  Edema: Trace  Mental Status: Normal mood and affect. Normal behavior. Normal judgment and thought content.   Assessment and Plan:  Pregnancy: G1P0 at [redacted]w[redacted]d  1. Encounter for supervision of normal first pregnancy in third trimester - Encouraged to continue iron supplements  Term labor symptoms and general obstetric precautions including but not limited to vaginal bleeding, contractions, leaking of  fluid and fetal movement were reviewed in detail with the patient. Please refer to After Visit Summary for other counseling recommendations.  Return in about 1 week (around 01/23/2019) for Return OB visit.  Future Appointments  Date Time Provider Department Center  01/23/2019 11:10 AM Raelyn Mora, CNM [facility] None    Raelyn Mora, [place]

## 2019-01-23 ENCOUNTER — Encounter: Payer: PRIVATE HEALTH INSURANCE | Admitting: Obstetrics and Gynecology

## 2019-01-29 ENCOUNTER — Encounter: Payer: Self-pay | Admitting: General Practice

## 2019-01-29 ENCOUNTER — Ambulatory Visit (INDEPENDENT_AMBULATORY_CARE_PROVIDER_SITE_OTHER): Payer: PRIVATE HEALTH INSURANCE | Admitting: Obstetrics and Gynecology

## 2019-01-29 VITALS — BP 124/69 | HR 95 | Wt 156.2 lb

## 2019-01-29 DIAGNOSIS — B951 Streptococcus, group B, as the cause of diseases classified elsewhere: Secondary | ICD-10-CM

## 2019-01-29 DIAGNOSIS — Z3403 Encounter for supervision of normal first pregnancy, third trimester: Secondary | ICD-10-CM

## 2019-01-29 DIAGNOSIS — O2343 Unspecified infection of urinary tract in pregnancy, third trimester: Secondary | ICD-10-CM

## 2019-01-29 DIAGNOSIS — Z113 Encounter for screening for infections with a predominantly sexual mode of transmission: Secondary | ICD-10-CM

## 2019-01-29 NOTE — Progress Notes (Addendum)
   PRENATAL VISIT NOTE  Subjective:  Nicole Harris is a 23 y.o. G1P0 at 4259w3d being seen today for ongoing prenatal care.  She is currently monitored for the following issues for this low-risk pregnancy and has Encounter for supervision of normal first pregnancy in second trimester; Nausea/vomiting in pregnancy; GBS (group B Streptococcus carrier), +RV culture, currently pregnant; and Anemia in pregnancy on their problem list.  Patient reports no complaints.  Contractions: Not present. Vag. Bleeding: None.  Movement: Present. Denies leaking of fluid.   The following portions of the patient's history were reviewed and updated as appropriate: allergies, current medications, past family history, past medical history, past social history, past surgical history and problem list. Problem list updated.  Objective:   Vitals:   01/29/19 1509  BP: 124/69  Pulse: 95  Weight: 70.9 kg    Fetal Status: Fetal Heart Rate (bpm): 142 Fundal Height: 39 cm Movement: Present  Presentation: Vertex  General:  Alert, oriented and cooperative. Patient is in no acute distress.  Skin: Skin is warm and dry. No rash noted.   Cardiovascular: Normal heart rate noted  Respiratory: Normal respiratory effort, no problems with respiration noted  Abdomen: Soft, gravid, appropriate for gestational age.  Pain/Pressure: Absent     Pelvic: Cervical exam performed Dilation: Closed Effacement (%): Thick Station: -3  Extremities: Normal range of motion.  Edema: None  Mental Status: Normal mood and affect. Normal behavior. Normal judgment and thought content.   Assessment and Plan:  Pregnancy: G1P0 at 3959w3d  1. Encounter for supervision of normal first pregnancy in third trimester - Discussed pain management in labor  - IOL @ 41 wks on 02/03/2019 @ 0730 - Anticipatory guidance of IOL methods/expectations  2. GBS (group b Streptococcus) UTI complicating pregnancy, third trimester - Will receive antibiotics in  labor  3. Screening examination for STD (sexually transmitted disease) - Cervicovaginal ancillary only( [place])    Term labor symptoms and general obstetric precautions including but not limited to vaginal bleeding, contractions, leaking of fluid and fetal movement were reviewed in detail with the patient. Please refer to After Visit Summary for other counseling recommendations.  Return in about 6 weeks (around 03/12/2019) for PP visit.  Future Appointments  Date Time Provider Department Center  02/03/2019  7:30 AM [facility] None  03/19/2019  1:30 PM Raelyn Moraawson, Rolitta, CNM [facility] None    Bernerd LimboJamilla R Khristina Janota, Student-MidWife

## 2019-01-30 ENCOUNTER — Encounter (HOSPITAL_COMMUNITY): Payer: Self-pay | Admitting: *Deleted

## 2019-01-30 ENCOUNTER — Telehealth (HOSPITAL_COMMUNITY): Payer: Self-pay | Admitting: *Deleted

## 2019-01-30 ENCOUNTER — Telehealth: Payer: Self-pay | Admitting: *Deleted

## 2019-01-30 DIAGNOSIS — B3731 Acute candidiasis of vulva and vagina: Secondary | ICD-10-CM

## 2019-01-30 DIAGNOSIS — B373 Candidiasis of vulva and vagina: Secondary | ICD-10-CM

## 2019-01-30 LAB — CERVICOVAGINAL ANCILLARY ONLY
Bacterial vaginitis: NEGATIVE
Candida vaginitis: POSITIVE — AB
Chlamydia: NEGATIVE
NEISSERIA GONORRHEA: NEGATIVE
Trichomonas: NEGATIVE

## 2019-01-30 MED ORDER — FLUCONAZOLE 150 MG PO TABS: 150.0000 mg | ORAL_TABLET | Freq: Once | ORAL | 0 refills | Status: AC

## 2019-01-30 MED ORDER — TERCONAZOLE 0.4 % VA CREA: 1.0000 | TOPICAL_CREAM | Freq: Every day | VAGINAL | 0 refills | Status: DC

## 2019-01-30 NOTE — Addendum Note (Signed)
Addended by: Clovis Pu on: 01/30/2019 11:40 AM   Modules accepted: Orders

## 2019-01-30 NOTE — Telephone Encounter (Signed)
-----   Message from [place], [place] sent at 01/30/2019 11:23 AM EST ----- Rx Diflucan 150 mg po x 1

## 2019-01-30 NOTE — Telephone Encounter (Signed)
Preadmission screen  

## 2019-02-01 ENCOUNTER — Inpatient Hospital Stay (HOSPITAL_COMMUNITY): Payer: No Typology Code available for payment source | Admitting: Anesthesiology

## 2019-02-01 ENCOUNTER — Other Ambulatory Visit: Payer: Self-pay

## 2019-02-01 ENCOUNTER — Encounter (HOSPITAL_COMMUNITY): Payer: Self-pay | Admitting: *Deleted

## 2019-02-01 ENCOUNTER — Inpatient Hospital Stay (HOSPITAL_COMMUNITY)
Admission: AD | Admit: 2019-02-01 | Discharge: 2019-02-04 | DRG: 807 | Disposition: A | Discharge status: Home / Self Care | Payer: No Typology Code available for payment source | Attending: Obstetrics & Gynecology | Admitting: Obstetrics & Gynecology

## 2019-02-01 DIAGNOSIS — O99820 Streptococcus B carrier state complicating pregnancy: Secondary | ICD-10-CM

## 2019-02-01 DIAGNOSIS — O219 Vomiting of pregnancy, unspecified: Secondary | ICD-10-CM

## 2019-02-01 DIAGNOSIS — Z87891 Personal history of nicotine dependence: Secondary | ICD-10-CM | POA: Diagnosis not present

## 2019-02-01 DIAGNOSIS — O9902 Anemia complicating childbirth: Secondary | ICD-10-CM | POA: Diagnosis present

## 2019-02-01 DIAGNOSIS — O99019 Anemia complicating pregnancy, unspecified trimester: Secondary | ICD-10-CM | POA: Diagnosis present

## 2019-02-01 DIAGNOSIS — D649 Anemia, unspecified: Secondary | ICD-10-CM | POA: Diagnosis present

## 2019-02-01 DIAGNOSIS — O480 Post-term pregnancy: Secondary | ICD-10-CM | POA: Diagnosis present

## 2019-02-01 DIAGNOSIS — O99824 Streptococcus B carrier state complicating childbirth: Secondary | ICD-10-CM | POA: Diagnosis present

## 2019-02-01 DIAGNOSIS — Z3A40 40 weeks gestation of pregnancy: Secondary | ICD-10-CM

## 2019-02-01 DIAGNOSIS — Z3483 Encounter for supervision of other normal pregnancy, third trimester: Secondary | ICD-10-CM | POA: Diagnosis present

## 2019-02-01 LAB — CBC
HCT: 40.2 % (ref 36.0–46.0)
Hemoglobin: 12.7 g/dL (ref 12.0–15.0)
MCH: 29.2 pg (ref 26.0–34.0)
MCHC: 31.6 g/dL (ref 30.0–36.0)
MCV: 92.4 fL (ref 80.0–100.0)
Platelets: 273 10*3/uL (ref 150–400)
RBC: 4.35 MIL/uL (ref 3.87–5.11)
RDW: 24.7 % — ABNORMAL HIGH (ref 11.5–15.5)
WBC: 13 10*3/uL — ABNORMAL HIGH (ref 4.0–10.5)
nRBC: 0 % (ref 0.0–0.2)

## 2019-02-01 LAB — TYPE AND SCREEN
ABO/RH(D): O POS
Antibody Screen: NEGATIVE

## 2019-02-01 LAB — ABO/RH: ABO/RH(D): O POS

## 2019-02-01 MED ORDER — EPHEDRINE 5 MG/ML INJ
10.0000 mg | INTRAVENOUS | Status: DC | PRN
  Filled 2019-02-01: qty 2

## 2019-02-01 MED ORDER — PHENYLEPHRINE 40 MCG/ML (10ML) SYRINGE FOR IV PUSH (FOR BLOOD PRESSURE SUPPORT)
80.0000 ug | PREFILLED_SYRINGE | INTRAVENOUS | Status: DC | PRN
  Filled 2019-02-01 (×2): qty 10

## 2019-02-01 MED ORDER — FENTANYL CITRATE (PF) 100 MCG/2ML IJ SOLN
100.0000 ug | Freq: Once | INTRAMUSCULAR | Status: AC
  Administered 2019-02-01: 100 ug via INTRAVENOUS
  Filled 2019-02-01: qty 2

## 2019-02-01 MED ORDER — LIDOCAINE HCL (PF) 1 % IJ SOLN
30.0000 mL | INTRAMUSCULAR | Status: AC | PRN
  Administered 2019-02-02: 30 mL via SUBCUTANEOUS
  Filled 2019-02-01: qty 30

## 2019-02-01 MED ORDER — SOD CITRATE-CITRIC ACID 500-334 MG/5ML PO SOLN: 30.0000 mL | ORAL | Status: DC | PRN

## 2019-02-01 MED ORDER — OXYTOCIN BOLUS FROM INFUSION
500.0000 mL | Freq: Once | INTRAVENOUS | Status: AC
  Administered 2019-02-02: 500 mL via INTRAVENOUS

## 2019-02-01 MED ORDER — FLEET ENEMA 7-19 GM/118ML RE ENEM: 1.0000 | ENEMA | RECTAL | Status: DC | PRN

## 2019-02-01 MED ORDER — OXYTOCIN 40 UNITS IN NORMAL SALINE INFUSION - SIMPLE MED
2.5000 [IU]/h | INTRAVENOUS | Status: DC
  Administered 2019-02-02: 2.5 [IU]/h via INTRAVENOUS

## 2019-02-01 MED ORDER — LACTATED RINGERS IV BOLUS
1000.0000 mL | Freq: Once | INTRAVENOUS | Status: AC
  Administered 2019-02-01: 1000 mL via INTRAVENOUS

## 2019-02-01 MED ORDER — PHENYLEPHRINE 40 MCG/ML (10ML) SYRINGE FOR IV PUSH (FOR BLOOD PRESSURE SUPPORT): 80.0000 ug | PREFILLED_SYRINGE | INTRAVENOUS | Status: DC | PRN

## 2019-02-01 MED ORDER — LIDOCAINE HCL (PF) 1 % IJ SOLN
INTRAMUSCULAR | Status: DC | PRN
  Administered 2019-02-01: 5 mL via EPIDURAL

## 2019-02-01 MED ORDER — LACTATED RINGERS IV SOLN
INTRAVENOUS | Status: DC
  Administered 2019-02-01 (×2): via INTRAVENOUS

## 2019-02-01 MED ORDER — LACTATED RINGERS IV SOLN: 500.0000 mL | Freq: Once | INTRAVENOUS | Status: DC

## 2019-02-01 MED ORDER — EPHEDRINE 5 MG/ML INJ: 10.0000 mg | INTRAVENOUS | Status: DC | PRN

## 2019-02-01 MED ORDER — FENTANYL 2.5 MCG/ML BUPIVACAINE 1/10 % EPIDURAL INFUSION (WH - ANES): 14.0000 mL/h | INTRAMUSCULAR | Status: DC | PRN

## 2019-02-01 MED ORDER — ONDANSETRON HCL 4 MG/2ML IJ SOLN
4.0000 mg | Freq: Four times a day (QID) | INTRAMUSCULAR | Status: DC | PRN
  Administered 2019-02-02: 4 mg via INTRAVENOUS
  Filled 2019-02-01: qty 2

## 2019-02-01 MED ORDER — FENTANYL CITRATE (PF) 100 MCG/2ML IJ SOLN
100.0000 ug | INTRAMUSCULAR | Status: DC | PRN
  Administered 2019-02-01 – 2019-02-02 (×4): 100 ug via INTRAVENOUS
  Filled 2019-02-01 (×4): qty 2

## 2019-02-01 MED ORDER — SODIUM CHLORIDE 0.9 % IV SOLN
5.0000 10*6.[IU] | Freq: Once | INTRAVENOUS | Status: AC
  Administered 2019-02-01: 5 10*6.[IU] via INTRAVENOUS
  Filled 2019-02-01: qty 5

## 2019-02-01 MED ORDER — DIPHENHYDRAMINE HCL 50 MG/ML IJ SOLN: 12.5000 mg | INTRAMUSCULAR | Status: DC | PRN

## 2019-02-01 MED ORDER — FENTANYL 2.5 MCG/ML BUPIVACAINE 1/10 % EPIDURAL INFUSION (WH - ANES)
14.0000 mL/h | INTRAMUSCULAR | Status: DC | PRN
  Administered 2019-02-01 – 2019-02-02 (×3): 14 mL/h via EPIDURAL
  Filled 2019-02-01 (×3): qty 100

## 2019-02-01 MED ORDER — LACTATED RINGERS IV SOLN
500.0000 mL | INTRAVENOUS | Status: DC | PRN
  Administered 2019-02-02: 500 mL via INTRAVENOUS

## 2019-02-01 MED ORDER — PENICILLIN G 3 MILLION UNITS IVPB - SIMPLE MED
3.0000 10*6.[IU] | INTRAVENOUS | Status: DC
  Administered 2019-02-01 – 2019-02-02 (×4): 3 10*6.[IU] via INTRAVENOUS
  Filled 2019-02-01 (×6): qty 100

## 2019-02-01 NOTE — Progress Notes (Signed)
OB/GYN Faculty Practice: Labor Progress Note  Subjective: Very uncomfortable. Grandmother on the way to help provide her some support. Has received 4 doses of IV fentanyl with minimal relief. Would like epidural.  Objective: BP 125/70   Pulse 87   Temp (!) 97.5 F (36.4 C) (Oral)   Resp 18   Ht 5\' 4"  (1.626 m)   Wt 70.8 kg   LMP 04/08/2018   SpO2 100% Comment: ra  BMI 26.78 kg/m  Gen: uncomfortable appearing, moving around bed, on side during contractions Dilation: 1.5 Effacement (%): 80 Cervical Position: Posterior Station: -2 Presentation: Vertex Exam by:: dr Taetum Flewellen  Assessment and Plan: 23 y.o. G1P0 5722w6d who was admitted in early labor.  Labor: Expectant management. Regular, uncomfortable contractions. Will recheck cervix once epidural in place, could consider augmentation prn at that time.  -- pain control: going to get epidural now -- PPH Risk: low  Fetal Wellbeing: EFW 7-8lbs by Leopold's. Cephalic by sutures.  -- GBS (positive) - pcn  -- continuous fetal monitoring - category I    Tigran Haynie S. Earlene PlaterWallace, DO OB/GYN Fellow, Faculty Practice  2:25 PM

## 2019-02-01 NOTE — Progress Notes (Signed)
OB/GYN Faculty Practice: Labor Progress Note  Subjective: Discussed plan of care with RN. Patient received epidural and much more comfortable. Continuing to progress on own.   Objective: BP (!) 107/59   Pulse (!) 107   Temp 98.6 F (37 C) (Oral)   Resp 18   Ht 5\' 4"  (1.626 m)   Wt 70.8 kg   LMP 04/08/2018   SpO2 100%   BMI 26.78 kg/m  Gen: strip note  Dilation: 6 Effacement (%): 80, 90 Cervical Position: Posterior Station: -2 Presentation: Vertex Exam by:: k fields, rn  Assessment and Plan: 23 y.o. G1P0 [redacted]w[redacted]d who was admitted in early labor.  Labor: Expectant management. Continuing to change without augmentation. Consider pitocin or AROM if no change by next check. -- pain control: epidural now in place -- PPH Risk: low  Fetal Wellbeing: EFW 7-8lbs by Leopold's. Cephalic by sutures.  -- GBS (positive) - pcn  -- continuous fetal monitoring - category I    Laurel S. Earlene Plater, DO OB/GYN Fellow, Faculty Practice  7:05 PM

## 2019-02-01 NOTE — Anesthesia Preprocedure Evaluation (Addendum)
Anesthesia Evaluation  Patient identified by MRN, date of birth, ID band Patient awake    Reviewed: Allergy & Precautions, NPO status , Patient's Chart, lab work & pertinent test results  Airway Mallampati: II  TM Distance: >3 FB Neck ROM: Full    Dental no notable dental hx. (+) Teeth Intact   Pulmonary former smoker,    Pulmonary exam normal breath sounds clear to auscultation       Cardiovascular Exercise Tolerance: Good negative cardio ROS Normal cardiovascular exam Rhythm:Regular Rate:Normal     Neuro/Psych Anxiety Depression Hx of ADHD negative neurological ROS     GI/Hepatic negative GI ROS, Neg liver ROS,   Endo/Other    Renal/GU negative Renal ROS     Musculoskeletal   Abdominal   Peds  Hematology Hgb 12.7  Plts 273   Anesthesia Other Findings   Reproductive/Obstetrics (+) Pregnancy                             Anesthesia Physical Anesthesia Plan  ASA: II  Anesthesia Plan: Epidural   Post-op Pain Management:    Induction:   PONV Risk Score and Plan:   Airway Management Planned:   Additional Equipment:   Intra-op Plan:   Post-operative Plan:   Informed Consent: I have reviewed the patients History and Physical, chart, labs and discussed the procedure including the risks, benefits and alternatives for the proposed anesthesia with the patient or authorized representative who has indicated his/her understanding and acceptance.       Plan Discussed with:   Anesthesia Plan Comments: (22 F G1P0 for LEA)        Anesthesia Quick Evaluation

## 2019-02-01 NOTE — MAU Note (Signed)
Nicole Harris is a 23 y.o. at [redacted]w[redacted]d here in MAU reporting:  +contractions. Every 4 minutes Pain score: 10/10 States was closed at last VE. Denies LOF or VB Vitals:   02/01/19 0719  BP: 132/76  Pulse: 76  Resp: 19  Temp: (!) 97.4 F (36.3 C)  SpO2: 100%     +FM Lab orders placed from triage: mau labor triage

## 2019-02-01 NOTE — H&P (Signed)
OBSTETRIC ADMISSION HISTORY AND PHYSICAL  Nicole Harris is a 23 y.o. female G1P0 with IUP at [redacted]w[redacted]d by --/16 presenting for early labor. Changed in MAU from fingertip to 1-2cm, contracting every 2 minutes and very uncomfortable. Has am induction scheduled in two days for post-dates.   Reports fetal movement. Denies vaginal bleeding, leakage of fluids.  She received her prenatal care at [facility].  Support person in labor: grandmother on her way   Ultrasounds . 16w3: normal anatomy, female, limited card views and abdominal cord insertion, anterior placenta . 21w3: EFW 434g (48%), completion of anatomy scan within normal limits  Prenatal History/Complications: . Anemia in pregnancy . GBS(+) . Nausea and vomiting in pregnancy   Past Medical History: Past Medical History:  Diagnosis Date  . ADHD (attention deficit hyperactivity disorder)   . Anemia   . Anxiety   . Depression     Past Surgical History: Past Surgical History:  Procedure Laterality Date  . ADENOIDECTOMY    . TONGUE SURGERY    . TONSILLECTOMY      Obstetrical History: OB History    Gravida  1   Para      Term      Preterm      AB      Living        SAB      TAB      Ectopic      Multiple      Live Births              Social History: Social History   Socioeconomic History  . Marital status: Single    Spouse name: Not on file  . Number of children: Not on file  . Years of education: Not on file  . Highest education level: Not on file  Occupational History  . Not on file  Social Needs  . Financial resource strain: Not hard at all  . Food insecurity:    Worry: Sometimes true    Inability: Never true  . Transportation needs:    Medical: Yes    Non-medical: Not on file  Tobacco Use  . Smoking status: Former Games developer  . Smokeless tobacco: Never Used  Substance and Sexual Activity  . Alcohol use: No  . Drug use: No  . Sexual activity: Yes  Lifestyle  . Physical activity:     Days per week: Not on file    Minutes per session: Not on file  . Stress: Only a little  Relationships  . Social connections:    Talks on phone: Not on file    Gets together: Not on file    Attends religious service: Not on file    Active member of club or organization: Not on file    Attends meetings of clubs or organizations: Not on file    Relationship status: Not on file  Other Topics Concern  . Not on file  Social History Narrative  . Not on file    Family History: Family History  Problem Relation Age of Onset  . Diabetes Paternal Grandmother   . Drug abuse Mother   . Cancer Maternal Grandmother   . Thyroid disease Maternal Grandmother   . Ovarian cancer Maternal Grandmother   . Hypertension Paternal Grandfather     Allergies: No Known Allergies  Medications Prior to Admission  Medication Sig Dispense Refill Last Dose  . ferrous sulfate 325 (65 FE) MG tablet Take 1 tablet (325 mg total) by mouth 2 (two)  times daily with a meal. 60 tablet 0 Past Week at Unknown time  . Prenatal Vit-Fe Phos-FA-Omega (VITAFOL GUMMIES) 3.33-0.333-34.8 MG CHEW Chew 3 Pieces by mouth daily. 90 tablet 12 01/31/2019 at Unknown time     Review of Systems  All systems reviewed and negative except as stated in HPI  Blood pressure 121/75, pulse 88, temperature (!) 97.5 F (36.4 C), temperature source Oral, resp. rate 18, height 5\' 4"  (1.626 m), weight 70.8 kg, last menstrual period 04/08/2018, SpO2 100 %. General appearance: uncomfortable appearing Lungs: no respiratory distress Heart: regular rate  Abdomen: soft, non-tender; gravid  Pelvic: deferred Extremities: no LE edema  Presentation: cephalic by prior RN check Fetal monitoring: 120s/mod/+a/-d Uterine activity: every 2-3 minutes  Dilation: 1.5 Effacement (%): 60 Station: -3 Exam by:: Nicole Rase RN  Prenatal labs: ABO, Rh: --/--/O POS (02/02 0916) Antibody: NEG (02/02 0916) Rubella: 2.20 (07/11 1119) RPR: Non Reactive  (11/05 0847)  HBsAg: Negative (07/11 1119)  HIV: Non Reactive (11/05 0847)  GBS: Positive (01/07 0000)  Glucola: normal 2-hr Genetic screening:  declined  Prenatal Transfer Tool  Maternal Diabetes: No Genetic Screening: Declined Maternal Ultrasounds/Referrals: Normal Fetal Ultrasounds or other Referrals:  None Maternal Substance Abuse:  No Significant Maternal Medications:  None Significant Maternal Lab Results: GBS(+)  Results for orders placed or performed during the hospital encounter of 02/01/19 (from the past 24 hour(s))  Type and screen [facility]   Collection Time: 02/01/19  9:16 AM  Result Value Ref Range   ABO/RH(D) O POS    Antibody Screen NEG    Sample Expiration      02/04/2019 Performed at [facility], [address] [place], [place] [address]     Patient Active Problem List   Diagnosis Date Noted  . Indication for care in labor or delivery 02/01/2019  . GBS (group B Streptococcus carrier), +RV culture, currently pregnant 01/08/2019  . Anemia in pregnancy 11/04/2018  . Encounter for supervision of normal first pregnancy in second trimester 07/10/2018  . Nausea/vomiting in pregnancy 07/10/2018    Assessment/Plan:  Nicole Harris is a 23 y.o. G1P0 at [redacted]w[redacted]d here for early labor.   Labor: Expectant management. Will consideration augmentation if not progressing on own. Would benefit from FB, cytotec but contracting too much for cytotec at this time and would not tolerate balloon.  -- pain control: IV fentanyl for now  Fetal Wellbeing: EFW 7-8lbs by Leopold's. Cephalic by sutures.  -- GBS (positive) - pcn  -- continuous fetal monitoring - category I   Postpartum Planning -- breast/POPs -- RI/declined Tdap/[x] flu   Nicole Goh S. Earlene Plater, DO OB/GYN Fellow

## 2019-02-01 NOTE — Anesthesia Procedure Notes (Addendum)
Epidural Patient location during procedure: OB Start time: 02/01/2019 3:16 PM End time: 02/01/2019 3:30 PM  Staffing Anesthesiologist: Trevor Iha, MD Performed: anesthesiologist   Preanesthetic Checklist Completed: patient identified, site marked, surgical consent, pre-op evaluation, timeout performed, IV checked, risks and benefits discussed and monitors and equipment checked  Epidural Patient position: sitting Prep: site prepped and draped and DuraPrep Patient monitoring: continuous pulse ox and blood pressure Approach: midline Location: L3-L4 Injection technique: LOR air  Needle:  Needle type: Tuohy  Needle gauge: 17 G Needle length: 9 cm and 9 Needle insertion depth: 5 cm cm Catheter type: closed end flexible Catheter size: 19 Gauge Catheter at skin depth: 10 cm Test dose: negative  Assessment Events: blood not aspirated, injection not painful, no injection resistance, negative IV test and no paresthesia  Additional Notes Patient identified. Risks/Benefits/Options discussed with patient including but not limited to bleeding, infection, nerve damage, paralysis, failed block, incomplete pain control, headache, blood pressure changes, nausea, vomiting, reactions to medication both or allergic, itching and postpartum back pain. Confirmed with bedside nurse the patient's most recent platelet count. Confirmed with patient that they are not currently taking any anticoagulation, have any bleeding history or any family history of bleeding disorders. Patient expressed understanding and wished to proceed. All questions were answered. Sterile technique was used throughout the entire procedure. Please see nursing notes for vital signs. Test dose was given through epidural needle and negative prior to continuing to dose epidural or start infusion. Warning signs of high block given to the patient including shortness of breath, tingling/numbness in hands, complete motor block, or any  concerning symptoms with instructions to call for help. Patient was given instructions on fall risk and not to get out of bed. All questions and concerns addressed with instructions to call with any issues. 1 Attempt (S) . Patient tolerated procedure well.

## 2019-02-01 NOTE — Anesthesia Pain Management Evaluation Note (Signed)
  CRNA Pain Management Visit Note  Patient: Nicole Harris, 23 y.o., female  "Hello I am a member of the anesthesia team at [facility]. We have an anesthesia team available at all times to provide care throughout the hospital, including epidural management and anesthesia for C-section. I don't know your plan for the delivery whether it a natural birth, water birth, IV sedation, nitrous supplementation, doula or epidural, but we want to meet your pain goals."   1.Was your pain managed to your expectations on prior hospitalizations?   No prior hospitalizations  2.What is your expectation for pain management during this hospitalization?     Epidural  3.How can we help you reach that goal? Epidural in place at time of visit  Record the patient's initial score and the patient's pain goal.   Pain: 2  Pain Goal: 8 [facility] wants you to be able to say your pain was always managed very well.  Rica Records 02/01/2019

## 2019-02-02 ENCOUNTER — Encounter (HOSPITAL_COMMUNITY): Payer: Self-pay | Admitting: *Deleted

## 2019-02-02 DIAGNOSIS — O480 Post-term pregnancy: Secondary | ICD-10-CM

## 2019-02-02 DIAGNOSIS — O99824 Streptococcus B carrier state complicating childbirth: Secondary | ICD-10-CM

## 2019-02-02 DIAGNOSIS — Z3A40 40 weeks gestation of pregnancy: Secondary | ICD-10-CM

## 2019-02-02 LAB — RPR, QUANT+TP ABS (REFLEX)
Rapid Plasma Reagin, Quant: 1:1 {titer} — ABNORMAL HIGH
T Pallidum Abs: NONREACTIVE

## 2019-02-02 LAB — RPR: RPR: REACTIVE — AB

## 2019-02-02 MED ORDER — WITCH HAZEL-GLYCERIN EX PADS: 1.0000 "application " | MEDICATED_PAD | CUTANEOUS | Status: DC | PRN

## 2019-02-02 MED ORDER — SIMETHICONE 80 MG PO CHEW: 80.0000 mg | CHEWABLE_TABLET | ORAL | Status: DC | PRN

## 2019-02-02 MED ORDER — FERROUS SULFATE 325 (65 FE) MG PO TABS
325.0000 mg | ORAL_TABLET | Freq: Two times a day (BID) | ORAL | Status: DC
  Administered 2019-02-02 – 2019-02-04 (×4): 325 mg via ORAL
  Filled 2019-02-02 (×4): qty 1

## 2019-02-02 MED ORDER — SENNOSIDES-DOCUSATE SODIUM 8.6-50 MG PO TABS
2.0000 | ORAL_TABLET | ORAL | Status: DC
  Administered 2019-02-02 – 2019-02-03 (×2): 2 via ORAL
  Filled 2019-02-02 (×2): qty 2

## 2019-02-02 MED ORDER — TETANUS-DIPHTH-ACELL PERTUSSIS 5-2.5-18.5 LF-MCG/0.5 IM SUSP
0.5000 mL | Freq: Once | INTRAMUSCULAR | Status: DC
  Filled 2019-02-02: qty 0.5

## 2019-02-02 MED ORDER — PRENATAL MULTIVITAMIN CH
1.0000 | ORAL_TABLET | Freq: Every day | ORAL | Status: DC
  Administered 2019-02-02 – 2019-02-04 (×3): 1 via ORAL
  Filled 2019-02-02 (×3): qty 1

## 2019-02-02 MED ORDER — ONDANSETRON HCL 4 MG/2ML IJ SOLN: 4.0000 mg | INTRAMUSCULAR | Status: DC | PRN

## 2019-02-02 MED ORDER — FENTANYL CITRATE (PF) 100 MCG/2ML IJ SOLN
INTRAMUSCULAR | Status: DC | PRN
  Administered 2019-02-02: 100 ug via EPIDURAL

## 2019-02-02 MED ORDER — ONDANSETRON HCL 4 MG PO TABS: 4.0000 mg | ORAL_TABLET | ORAL | Status: DC | PRN

## 2019-02-02 MED ORDER — ZOLPIDEM TARTRATE 5 MG PO TABS: 5.0000 mg | ORAL_TABLET | Freq: Every evening | ORAL | Status: DC | PRN

## 2019-02-02 MED ORDER — OXYTOCIN 40 UNITS IN NORMAL SALINE INFUSION - SIMPLE MED
1.0000 m[IU]/min | INTRAVENOUS | Status: DC
  Administered 2019-02-02: 2 m[IU]/min via INTRAVENOUS
  Filled 2019-02-02: qty 1000

## 2019-02-02 MED ORDER — TERBUTALINE SULFATE 1 MG/ML IJ SOLN
0.2500 mg | Freq: Once | INTRAMUSCULAR | Status: DC | PRN
  Filled 2019-02-02: qty 1

## 2019-02-02 MED ORDER — IBUPROFEN 600 MG PO TABS
600.0000 mg | ORAL_TABLET | Freq: Four times a day (QID) | ORAL | Status: DC
  Administered 2019-02-02 – 2019-02-04 (×9): 600 mg via ORAL
  Filled 2019-02-02 (×9): qty 1

## 2019-02-02 MED ORDER — COCONUT OIL OIL
1.0000 "application " | TOPICAL_OIL | Status: DC | PRN
  Administered 2019-02-02: 1 via TOPICAL
  Filled 2019-02-02: qty 120

## 2019-02-02 MED ORDER — BENZOCAINE-MENTHOL 20-0.5 % EX AERO
1.0000 "application " | INHALATION_SPRAY | CUTANEOUS | Status: DC | PRN
  Filled 2019-02-02: qty 56

## 2019-02-02 MED ORDER — BUPIVACAINE HCL (PF) 0.25 % IJ SOLN
INTRAMUSCULAR | Status: DC | PRN
  Administered 2019-02-02: 8 mL via EPIDURAL

## 2019-02-02 MED ORDER — DIBUCAINE 1 % RE OINT: 1.0000 "application " | TOPICAL_OINTMENT | RECTAL | Status: DC | PRN

## 2019-02-02 MED ORDER — ACETAMINOPHEN 325 MG PO TABS
650.0000 mg | ORAL_TABLET | ORAL | Status: DC | PRN
  Administered 2019-02-04: 650 mg via ORAL
  Filled 2019-02-02: qty 2

## 2019-02-02 MED ORDER — DIPHENHYDRAMINE HCL 25 MG PO CAPS: 25.0000 mg | ORAL_CAPSULE | Freq: Four times a day (QID) | ORAL | Status: DC | PRN

## 2019-02-02 NOTE — Anesthesia Postprocedure Evaluation (Signed)
Anesthesia Post Note  Patient: Nicole Harris  Procedure(s) Performed: AN AD HOC LABOR EPIDURAL     Patient location during evaluation: Mother Baby Anesthesia Type: Epidural Level of consciousness: awake and alert Pain management: pain level controlled Vital Signs Assessment: post-procedure vital signs reviewed and stable Respiratory status: spontaneous breathing, nonlabored ventilation and respiratory function stable Cardiovascular status: stable Postop Assessment: no headache, no backache, epidural receding, no apparent nausea or vomiting, patient able to bend at knees, able to ambulate and adequate PO intake Anesthetic complications: no    Last Vitals:  Vitals:   02/02/19 0810 02/02/19 0915  BP: 128/87 119/86  Pulse: 96 82  Resp: 20 20  Temp: 36.8 C 36.4 C  SpO2:      Last Pain:  Vitals:   02/02/19 0915  TempSrc:   PainSc: 0-No pain   Pain Goal: Patients Stated Pain Goal: 5 (02/01/19 1015)                 Laban Emperor

## 2019-02-02 NOTE — Consult Note (Signed)
Neonatology Note:   Attendance at Delivery:    I was asked by Dr. Erin Fulling to attend this vacuum-assisted vaginal delivery at 41 0/7 weeks. The mother is a G1P0 O pos, GBS positive with ADHD, anxiety/depression. She was treated with Pen G > 4 hours before delivery and was afebrile. ROM 8 hours before delivery, fluid clear. There were prolonged FHR decelerations in the minutes prior to delivery. Infant was pale blue, floppy, apneic, and with HR < 100 at birth. Delayed cord clamping was not done. I performed bulb suctioning, then applied PPV for about 1.5 minutes. His HR rose steadily and color improved; at 2 minutes, he was breathing on his own, so PPV was discontinued. He maintained normal HR and O2 saturation was within desired parameters in room air. Muscle tone returned to normal by 4-5 minutes, with mild tachypnea noted. Ap 1/9. Lungs with very coarse breath sounds and mild retractions were noted, so we performed chest PT and did DeLee suctioning X 1 for 10 ml clear, mucous fluid. The baby continued to have normal O2 saturations, although still sounded coarse on auscultation. I spoke with his parents and with his supervising nurse, allowing him to remain with his mother for skin to skin time under nursing supervision. Will keep the portable pulse oximeter on him for a few more minutes and have advised his nurse to contact me if there are further problems.Transferred to the care of Pediatrician.   Doretha Sou, MD

## 2019-02-02 NOTE — Discharge Summary (Signed)
OB Discharge Summary     Patient Name: Nicole Harris A Rothlisberger DOB: 10/12/1996 MRN: [id number]  Date of admission: 02/01/2019 Delivering MD: Raelyn MoraAWSON, ROLITTA   Date of discharge: 02/04/2019  Admitting diagnosis: 40 WKS CTX Intrauterine pregnancy: 8823w0d     Secondary diagnosis:  Principal Problem:   Indication for care in labor or delivery Active Problems:   GBS (group B Streptococcus carrier), +RV culture, currently pregnant   Anemia in pregnancy      Discharge diagnosis: Term Pregnancy Delivered                                                                                                Post partum procedures:None  Augmentation: Induction   Complications: None  Hospital course:  Induction of Labor With Vaginal Delivery   23 y.o. yo G1P1001 at 6023w0d was admitted to the hospital 02/01/2019 for induction of labor.  Indication for induction: Postdates.  Patient had an uncomplicated labor course as follows: Membrane Rupture Time/Date: 11:01 PM ,02/01/2019   Intrapartum Procedures: Episiotomy: None [1]                                         Lacerations:  2nd degree [3];Labial [10]  Patient had delivery of a Viable infant.  Information for the patient's newborn:  Abran DukeWiseburn, Boy Che [[id number]][[id number]]  Delivery Method: Vaginal, Vacuum (Extractor)(Filed from Delivery Summary)   02/02/2019  Details of delivery can be found below.  Patient had a routine postpartum course. Patient is discharged home 02/04/19.  Delivery Note At 6:34 AM a viable female was delivered via Vaginal, failed Vacuum (Extractor) (Presentation: OA).  APGAR: 1, 9; weight  .   Placenta status: spontaneous, intact via Tomasa BlaseSchultz.  Cord: 3 VC with no complications.  Cord pH: 7.5  Anesthesia: Epidural Episiotomy: None Lacerations: 2nd degree;Labial Suture Repair: 3.0 vicryl Est. Blood Loss (mL): 281   Physical exam  Vitals:   02/03/19 0627 02/03/19 1442 02/03/19 2242 02/04/19 0647  BP: 115/65 112/67 131/68 106/63  Pulse: 77  70 75 71  Resp: 18 16 18 16   Temp: 97.7 F (36.5 C) 98.2 F (36.8 C) 98 F (36.7 C)   TempSrc: Oral  Oral   SpO2:      Weight:      Height:       General: alert and cooperative Lochia: appropriate Uterine Fundus: firm Incision: N/A DVT Evaluation: No evidence of DVT seen on physical exam. Labs: Lab Results  Component Value Date   WBC 13.0 (H) 02/01/2019   HGB 12.7 02/01/2019   HCT 40.2 02/01/2019   MCV 92.4 02/01/2019   PLT 273 02/01/2019   CMP Latest Ref Rng & Units 02/14/2017  Glucose 65 - 99 mg/dL 84  BUN 6 - 20 mg/dL 8  Creatinine 7.820.44 - 9.561.00 mg/dL 2.130.67  Sodium 086135 - 578145 mmol/L 140  Potassium 3.5 - 5.1 mmol/L 4.2  Chloride 101 - 111 mmol/L 106  CO2 22 - 32 mmol/L 25  Calcium 8.9 - 10.3 mg/dL  9.9  Total Protein 6.0 - 8.3 g/dL -  Total Bilirubin 0.3 - 1.2 mg/dL -  Alkaline Phos 47 - 128 U/L -  AST 0 - 37 U/L -  ALT 0 - 35 U/L -    Discharge instruction: per After Visit Summary and "Baby and Me Booklet".  After visit meds:  Allergies as of 02/04/2019      Reactions   Latex Rash   Shellfish Allergy Rash      Medication List    TAKE these medications   ferrous sulfate 325 (65 FE) MG tablet Take 1 tablet (325 mg total) by mouth 2 (two) times daily with a meal.   ibuprofen 600 MG tablet Commonly known as:  ADVIL,MOTRIN Take 1 tablet (600 mg total) by mouth every 6 (six) hours.   senna-docusate 8.6-50 MG tablet Commonly known as:  Senokot-S Take 2 tablets by mouth daily. Start taking on:  February 05, 2019   VITAFOL GUMMIES 3.33-0.333-34.8 MG Chew Chew 3 Pieces by mouth daily.       Diet: routine diet  Activity: Advance as tolerated. Pelvic rest for 6 weeks.   Outpatient follow up:4 weeks Follow up Appt: Future Appointments  Date Time Provider Department Center  03/19/2019  1:30 PM Raelyn Mora, CNM [facility] None   Follow up Visit:No follow-ups on file.  Postpartum contraception: Progesterone only pills  Newborn Data: Live born female   Birth Weight: 8 lb 12.9 oz (3994 g) APGAR: 1, 9  Newborn Delivery   Birth date/time:  02/02/2019 06:34:00 Delivery type:  Vaginal, Vacuum (Extractor)     Baby Feeding: Breast Disposition:home with mother   02/04/2019 De Hollingshead, DO

## 2019-02-02 NOTE — Lactation Note (Signed)
This note was copied from a baby's chart. Lactation Consultation Note  Patient Name: Boy Leira Carlstedt OHYWV'P Date: 02/02/2019 Reason for consult: Initial assessment;Primapara;Term Pecola Leisure is 2 hours old.  Assisted mom with latching baby in cross cradle hold.  Nipples erect and breast compressible.  Baby latched easily and active suck/swallows observed.  Instructed to feed with feeding cues.  Demonstrated breast massage/compression.  Mom would like a manual pump for home use.  Manual pump given with instructions on use, cleaning and EBM storage. Encouraged to call for assist/concerns prn.  Maternal Data Has patient been taught Hand Expression?: Yes Does the patient have breastfeeding experience prior to this delivery?: No  Feeding Feeding Type: Breast Fed  LATCH Score Latch: Grasps breast easily, tongue down, lips flanged, rhythmical sucking.  Audible Swallowing: A few with stimulation  Type of Nipple: Everted at rest and after stimulation  Comfort (Breast/Nipple): Soft / non-tender  Hold (Positioning): Assistance needed to correctly position infant at breast and maintain latch.  LATCH Score: 8  Interventions Interventions: Assisted with latch;Breast compression;Skin to skin;Adjust position;Breast massage;Support pillows;Hand pump;Hand express  Lactation Tools Discussed/Used     Consult Status Consult Status: Follow-up Date: 02/03/19 Follow-up type: In-patient    Huston Foley 02/02/2019, 9:10 AM

## 2019-02-03 ENCOUNTER — Inpatient Hospital Stay (HOSPITAL_COMMUNITY): Admission: RE | Admit: 2019-02-03 | Payer: PRIVATE HEALTH INSURANCE | Source: Ambulatory Visit

## 2019-02-03 NOTE — Progress Notes (Signed)
MOB was referred for history of depression/anxiety. * Referral screened out by Clinical Social Worker because none of the following criteria appear to apply: ~ History of anxiety/depression during this pregnancy, or of post-partum depression following prior delivery. ~ Diagnosis of anxiety and/or depression within last 3 years. No concerns noted in OB record. OR * MOB's symptoms currently being treated with medication and/or therapy.  Please contact the Clinical Social Worker if needs arise, by MOB request, or if MOB scores greater than 9/yes to question 10 on Edinburgh Postpartum Depression Screen.  Jachin Coury Boyd-Gilyard, MSW, LCSW Clinical Social Work [phone]   

## 2019-02-03 NOTE — Progress Notes (Signed)
Post Partum Day 1 S/p SVD 02/02/2019 at 0634 Subjective: no complaints, up ad lib, voiding, tolerating PO and + flatus  Objective: Blood pressure 115/65, pulse 77, temperature 97.7 F (36.5 C), temperature source Oral, resp. rate 18, height 5\' 4"  (1.626 m), weight 70.8 kg, last menstrual period 04/08/2018, SpO2 99 %, unknown if currently breastfeeding.  Physical Exam:  General: alert, cooperative, appears stated age and no distress Lochia: appropriate Uterine Fundus: firm Incision: 2nd degree perineal, not inspected per patient request DVT Evaluation: No evidence of DVT seen on physical exam.  Recent Labs    02/01/19 0916  HGB 12.7  HCT 40.2    Assessment/Plan: Breast and bottle feeding. Discussed stages of feeding cues, cluster feeding Plan for discharge tomorrow   LOS: 2 days   Calvert CantorSamantha C Walfred Bettendorf, CNM 02/03/2019, 9:43 AM

## 2019-02-04 MED ORDER — SENNOSIDES-DOCUSATE SODIUM 8.6-50 MG PO TABS: 2.0000 | ORAL_TABLET | ORAL | 0 refills | Status: DC

## 2019-02-04 MED ORDER — IBUPROFEN 600 MG PO TABS: 600.0000 mg | ORAL_TABLET | Freq: Four times a day (QID) | ORAL | 1 refills | Status: DC

## 2019-02-04 NOTE — Lactation Note (Signed)
This note was copied from a baby's chart. 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 Has given bottles of formula through the night. Both nipples pink and raw. Reports they were bleeding rhough the night. Has pump from [facility] for home but has not been shown how to use it. Reviewed setup, use and cleaning of pump pieces. Mom pumped and obtained 32 ml of transitional milk. Reports breasts are feeling heavier today. Will talk with Ped about referral for frenotomy at visit tomorrow. Mom had her tongue revised at age 62 due to speech problems. Plans to try to put baby to the breast after procedure- will pump until then and let her nipples heal. No questions at present. Reviewed our phone number, OP appointments and BFSG as resources for support after DC. Encouraged to make OP appointment for assist with latch after procedure.   Patient Name: Nicole Harris GUYQI'H Date: 02/04/2019 Reason for consult: Follow-up assessment   Maternal Data Formula Feeding for Exclusion: No Has patient been taught Hand Expression?: Yes Does the patient have breastfeeding experience prior to this delivery?: No  Feeding    LATCH Score                   Interventions    Lactation Tools Discussed/Used WIC Program: Yes Pump Review: Setup, frequency, and cleaning Initiated by:: DW Date initiated:: 02/04/19   Consult Status Consult Status: Complete    Pamelia Hoit 02/04/2019, 10:36 AM

## 2019-02-04 NOTE — Discharge Instructions (Signed)

## 2019-02-04 NOTE — Lactation Note (Deleted)
This note was copied from a baby's chart. Lactation Consultation Note  Patient Name: Nicole Harris RAQTM'A Date: 02/04/2019 Reason for consult: Follow-up assessment;1st time breastfeeding;Term  P1 mother whose infant is now 57 hours old.  Baby was swaddled and being held by grandmother when I arrived.  Mother has been supplementing with large volumes of formula.  She has received the supplementation guidelines.  I reviewed the guidelines and encouraged her to put baby to breast more frequently and to give smaller volumes of formula after breast feeding.  Mother verbalized understanding and stated that baby is always hungry.  I acknowledged this and discussed milk coming to volume, feeding cues, feeding frequency and cluster feeding.  Mother stated baby was cluster feeding last night.  Encouraged her to continue feeding 8-12 times/24 hours or sooner if baby shows cues.  She will supplement as needed with formula.  Mother's breasts are soft and non tender.  She has a manual pump and a DEBP for home use.  Engorgement prevention/treatment discussed.  She has our OP phone number for questions after discharge and support group information with new location.  Encouraged her to attend this group.  Grandmother present and supportive.   Maternal Data Formula Feeding for Exclusion: No Has patient been taught Hand Expression?: Yes Does the patient have breastfeeding experience prior to this delivery?: No  Feeding Feeding Type: Bottle Fed - Formula  LATCH Score                   Interventions    Lactation Tools Discussed/Used WIC Program: No Initiated by:: Already initiated Date initiated:: 02/04/19   Consult Status Consult Status: Complete Date: 02/04/19 Follow-up type: Call as needed    Nicole Harris Nicole Harris 02/04/2019, 9:46 AM

## 2019-03-10 ENCOUNTER — Telehealth: Payer: Self-pay | Admitting: *Deleted

## 2019-03-10 NOTE — Telephone Encounter (Signed)
Patient called stating she is having bladder leakage. Patient is about 5 weeks post delivery. Patient reported she was told to wait until she is not sore or tender in vaginal area to complete Kegel exercises.  Patient waited about 3 weeks to start Kegel exercises. Advised patient it is normal to experience some urinary incontinence after giving birth. Patient to practice Kegel exercises at least 10 times in the AM, noon and PM. Patient also advised to go to restroom as soon as she gets the urge to urinate and make sure her bladder is completely empty. Patient voiced understanding. Patient stated she was given information regarding Kegel exercises.  Will reassess at postpartum visit 03/19/2019 at 1:30 PM.  Clovis Pu, RN

## 2019-03-17 ENCOUNTER — Telehealth: Payer: Self-pay | Admitting: General Practice

## 2019-03-17 NOTE — Telephone Encounter (Signed)
Pt informed to limit 1-support person to office visit.  Patient verbalized understanding.

## 2019-03-19 ENCOUNTER — Ambulatory Visit: Payer: PRIVATE HEALTH INSURANCE | Admitting: Obstetrics and Gynecology

## 2019-04-29 ENCOUNTER — Other Ambulatory Visit: Payer: Self-pay

## 2019-04-29 ENCOUNTER — Emergency Department (HOSPITAL_COMMUNITY)
Admission: EM | Admit: 2019-04-29 | Discharge: 2019-04-29 | Disposition: A | Discharge status: Home / Self Care | Payer: No Typology Code available for payment source | Attending: Emergency Medicine | Admitting: Emergency Medicine

## 2019-04-29 ENCOUNTER — Encounter (HOSPITAL_COMMUNITY): Payer: Self-pay

## 2019-04-29 DIAGNOSIS — L03031 Cellulitis of right toe: Secondary | ICD-10-CM | POA: Diagnosis not present

## 2019-04-29 DIAGNOSIS — Z79899 Other long term (current) drug therapy: Secondary | ICD-10-CM | POA: Diagnosis not present

## 2019-04-29 DIAGNOSIS — L539 Erythematous condition, unspecified: Secondary | ICD-10-CM | POA: Diagnosis present

## 2019-04-29 DIAGNOSIS — Z87891 Personal history of nicotine dependence: Secondary | ICD-10-CM | POA: Diagnosis not present

## 2019-04-29 MED ORDER — CEPHALEXIN 250 MG PO CAPS
500.0000 mg | ORAL_CAPSULE | Freq: Once | ORAL | Status: AC
  Administered 2019-04-29: 500 mg via ORAL
  Filled 2019-04-29: qty 2

## 2019-04-29 MED ORDER — CEPHALEXIN 500 MG PO CAPS: 500.0000 mg | ORAL_CAPSULE | Freq: Four times a day (QID) | ORAL | 0 refills | Status: DC

## 2019-04-29 NOTE — Discharge Instructions (Signed)
Take the prescribed medication as directed-- keflex sent to your pharmacy already.  I would continue warm soaks of the foot at least twice daily. Follow-up with your primary care doctor. Return to the ED for new or worsening symptoms.

## 2019-04-29 NOTE — ED Provider Notes (Signed)
[facility]   CSN: [id number] Arrival date & time: 04/29/19  0230    History   Chief Complaint Chief Complaint  Patient presents with  . Toe Pain    HPI Nicole Harris is a [age] y.o. female.     The history is provided by the patient and medical records.  Toe Pain     23 y.o. F with hx of ADHD, anemia, anxiety, depression, presenting to the ED for toe pain.  Patient states 2 days ago she started noticing some redness around toenail of right 2nd toe.  She cut her toenails because she thought it was because they were too long and pus started draining out of the toe.  States she has been soaking foot in epsom salt which has helped some along with topical neosporin.  She denies fever.  She is not diabetic.  No history of non-healing foot wounds or MRSA.  She is currently breastfeeding.  Past Medical History:  Diagnosis Date  . ADHD (attention deficit hyperactivity disorder)   . Anemia   . Anxiety   . Depression     Patient Active Problem List   Diagnosis Date Noted  . Indication for care in labor or delivery 02/01/2019  . GBS (group B Streptococcus carrier), +RV culture, currently pregnant 01/08/2019  . Anemia in pregnancy 11/04/2018  . Encounter for supervision of normal first pregnancy in second trimester 07/10/2018  . Nausea/vomiting in pregnancy 07/10/2018    Past Surgical History:  Procedure Laterality Date  . ADENOIDECTOMY    . TONGUE SURGERY    . TONSILLECTOMY       OB History    Gravida  1   Para  1   Term  1   Preterm      AB      Living  1     SAB      TAB      Ectopic      Multiple  0   Live Births  1            Home Medications    Prior to Admission medications   Medication Sig Start Date End Date Taking? Authorizing Provider  ferrous sulfate 325 (65 FE) MG tablet Take 1 tablet (325 mg total) by mouth 2 (two) times daily with a meal. 01/06/19   Raelyn Moraawson, Rolitta, CNM   ibuprofen (ADVIL,MOTRIN) 600 MG tablet Take 1 tablet (600 mg total) by mouth every 6 (six) hours. 02/04/19   Arvilla MarketWallace, Catherine Lauren, DO  Prenatal Vit-Fe Phos-FA-Omega (VITAFOL GUMMIES) 3.33-0.333-34.8 MG CHEW Chew 3 Pieces by mouth daily. 07/10/18   Raelyn Moraawson, Rolitta, CNM  senna-docusate (SENOKOT-S) 8.6-50 MG tablet Take 2 tablets by mouth daily. 02/05/19   Arvilla MarketWallace, Catherine Lauren, DO    Family History Family History  Problem Relation Age of Onset  . Diabetes Paternal Grandmother   . Drug abuse Mother   . Cancer Maternal Grandmother   . Thyroid disease Maternal Grandmother   . Ovarian cancer Maternal Grandmother   . Hypertension Paternal Grandfather     Social History Social History   Tobacco Use  . Smoking status: Former Games developermoker  . Smokeless tobacco: Never Used  Substance Use Topics  . Alcohol use: No  . Drug use: No     Allergies   Latex and Shellfish allergy   Review of Systems Review of Systems  Musculoskeletal: Positive for arthralgias.  All other systems reviewed and are negative.    Physical  Exam Updated Vital Signs Ht 5\' 4"  (1.626 m)   Wt 56.2 kg   BMI 21.28 kg/m   Physical Exam Vitals signs and nursing note reviewed.  Constitutional:      Appearance: She is well-developed.  HENT:     Head: Normocephalic and atraumatic.  Eyes:     Conjunctiva/sclera: Conjunctivae normal.     Pupils: Pupils are equal, round, and reactive to light.  Neck:     Musculoskeletal: Normal range of motion.  Cardiovascular:     Rate and Rhythm: Normal rate and regular rhythm.     Heart sounds: Normal heart sounds.  Pulmonary:     Effort: Pulmonary effort is normal.     Breath sounds: Normal breath sounds.  Abdominal:     General: Bowel sounds are normal.     Palpations: Abdomen is soft.  Musculoskeletal: Normal range of motion.     Comments: Right second toe with erythema around the cuticle, there is no apparent drainage but some crusting along lateral nail margin Small  amount of erythema along the medial margin of the right third toenail, very minor No open wounds/sores of the foot; normal distal sensation and perfusion to all toes  Skin:    General: Skin is warm and dry.  Neurological:     Mental Status: She is alert and oriented to person, place, and time.      ED Treatments / Results  Labs (all labs ordered are listed, but only abnormal results are displayed) Labs Reviewed - No data to display  EKG None  Radiology No results found.  Procedures Procedures (including critical care time)  Medications Ordered in ED Medications - No data to display   Initial Impression / Assessment and Plan / ED Course  I have reviewed the triage vital signs and the nursing notes.  Pertinent labs & imaging results that were available during my care of the patient were reviewed by me and considered in my medical decision making (see chart for details).  23 year old female here with concern of toe infection.  Had some pus draining from around the cuticle yesterday.  On exam she has erythema surrounding the nailbed of the right second toe but no drainable fluid collection.  Very minor erythema along the medial portion of the nailbed of third toe.  This is most consistent with a paronychia.  Does not appear to have any active drainage at this time.  Will be prescribed a course of Keflex, encourage warm soaks at least twice daily, can continue using topical Neosporin.  She can follow-up with her primary care doctor.  Return here for any new or acute changes.  Final Clinical Impressions(s) / ED Diagnoses   Final diagnoses:  Paronychia of second toe of right foot    ED Discharge Orders         Ordered    cephALEXin (KEFLEX) 500 MG capsule  4 times daily     04/29/19 0259           Garlon Hatchet, PA-C 04/29/19 0327    Zadie Rhine, MD 04/29/19 0430

## 2019-12-26 ENCOUNTER — Encounter (HOSPITAL_COMMUNITY): Payer: Self-pay | Admitting: *Deleted

## 2019-12-26 ENCOUNTER — Ambulatory Visit (HOSPITAL_COMMUNITY)
Admission: EM | Admit: 2019-12-26 | Discharge: 2019-12-26 | Disposition: A | Discharge status: Home / Self Care | Payer: No Typology Code available for payment source | Attending: Emergency Medicine | Admitting: Emergency Medicine

## 2019-12-26 ENCOUNTER — Other Ambulatory Visit: Payer: Self-pay

## 2019-12-26 DIAGNOSIS — Z791 Long term (current) use of non-steroidal anti-inflammatories (NSAID): Secondary | ICD-10-CM | POA: Diagnosis not present

## 2019-12-26 DIAGNOSIS — Z91013 Allergy to seafood: Secondary | ICD-10-CM | POA: Diagnosis not present

## 2019-12-26 DIAGNOSIS — Z87891 Personal history of nicotine dependence: Secondary | ICD-10-CM | POA: Insufficient documentation

## 2019-12-26 DIAGNOSIS — Z20828 Contact with and (suspected) exposure to other viral communicable diseases: Secondary | ICD-10-CM | POA: Insufficient documentation

## 2019-12-26 DIAGNOSIS — D649 Anemia, unspecified: Secondary | ICD-10-CM | POA: Insufficient documentation

## 2019-12-26 DIAGNOSIS — R05 Cough: Secondary | ICD-10-CM | POA: Insufficient documentation

## 2019-12-26 DIAGNOSIS — J069 Acute upper respiratory infection, unspecified: Secondary | ICD-10-CM | POA: Diagnosis not present

## 2019-12-26 DIAGNOSIS — Z833 Family history of diabetes mellitus: Secondary | ICD-10-CM | POA: Insufficient documentation

## 2019-12-26 DIAGNOSIS — B9789 Other viral agents as the cause of diseases classified elsewhere: Secondary | ICD-10-CM | POA: Diagnosis not present

## 2019-12-26 DIAGNOSIS — Z3202 Encounter for pregnancy test, result negative: Secondary | ICD-10-CM | POA: Diagnosis not present

## 2019-12-26 DIAGNOSIS — J029 Acute pharyngitis, unspecified: Secondary | ICD-10-CM

## 2019-12-26 DIAGNOSIS — Z8041 Family history of malignant neoplasm of ovary: Secondary | ICD-10-CM | POA: Diagnosis not present

## 2019-12-26 DIAGNOSIS — Z91040 Latex allergy status: Secondary | ICD-10-CM | POA: Insufficient documentation

## 2019-12-26 DIAGNOSIS — Z79899 Other long term (current) drug therapy: Secondary | ICD-10-CM | POA: Diagnosis not present

## 2019-12-26 LAB — POCT PREGNANCY, URINE: Preg Test, Ur: NEGATIVE

## 2019-12-26 LAB — POC URINE PREG, ED: Preg Test, Ur: NEGATIVE

## 2019-12-26 MED ORDER — BENZONATATE 200 MG PO CAPS: 200.0000 mg | ORAL_CAPSULE | Freq: Three times a day (TID) | ORAL | 0 refills | Status: AC | PRN

## 2019-12-26 MED ORDER — FLUTICASONE PROPIONATE 50 MCG/ACT NA SUSP: 1.0000 | Freq: Every day | NASAL | 0 refills | Status: DC

## 2019-12-26 MED ORDER — IBUPROFEN 800 MG PO TABS: 800.0000 mg | ORAL_TABLET | Freq: Three times a day (TID) | ORAL | 0 refills | Status: DC

## 2019-12-26 MED ORDER — CETIRIZINE HCL 10 MG PO CAPS: 10.0000 mg | ORAL_CAPSULE | Freq: Every day | ORAL | 0 refills | Status: DC

## 2019-12-26 NOTE — Discharge Instructions (Signed)
Pregnancy test negative Covid swab pending, quarantine until results return, approximately 2 days, monitor my chart Begin daily Flonase and cetirizine to help with nasal congestion and drainage Tessalon every 8 hours as needed for cough Tylenol and ibuprofen for fevers, body aches/chills and headaches Rest and drink plenty of fluids  Follow-up if symptoms not improving, worsening, persistent fevers, shortness of breath or difficulty breathing     Person Under Monitoring Name: Nicole Harris  Location: [place] [address]   Infection Prevention Recommendations for Individuals Confirmed to have, or Being Evaluated for, 2019 Novel Coronavirus (COVID-19) Infection Who Receive Care at Home  Individuals who are confirmed to have, or are being evaluated for, COVID-19 should follow the prevention steps below until a healthcare provider or local or state health department says they can return to normal activities.  Stay home except to get medical care You should restrict activities outside your home, except for getting medical care. Do not go to work, school, or public areas, and do not use public transportation or taxis.  Call ahead before visiting your doctor Before your medical appointment, call the healthcare provider and tell them that you have, or are being evaluated for, COVID-19 infection. This will help the healthcare provider's office take steps to keep other people from getting infected. Ask your healthcare provider to call the local or state health department.  Monitor your symptoms Seek prompt medical attention if your illness is worsening (e.g., difficulty breathing). Before going to your medical appointment, call the healthcare provider and tell them that you have, or are being evaluated for, COVID-19 infection. Ask your healthcare provider to call the local or state health department.  Wear a facemask You should wear a facemask that  covers your nose and mouth when you are in the same room with other people and when you visit a healthcare provider. People who live with or visit you should also wear a facemask while they are in the same room with you.  Separate yourself from other people in your home As much as possible, you should stay in a different room from other people in your home. Also, you should use a separate bathroom, if available.  Avoid sharing household items You should not share dishes, drinking glasses, cups, eating utensils, towels, bedding, or other items with other people in your home. After using these items, you should wash them thoroughly with soap and water.  Cover your coughs and sneezes Cover your mouth and nose with a tissue when you cough or sneeze, or you can cough or sneeze into your sleeve. Throw used tissues in a lined trash can, and immediately wash your hands with soap and water for at least 20 seconds or use an alcohol-based hand rub.  Wash your [facility] your hands often and thoroughly with soap and water for at least 20 seconds. You can use an alcohol-based hand sanitizer if soap and water are not available and if your hands are not visibly dirty. Avoid touching your eyes, nose, and mouth with unwashed hands.   Prevention Steps for Caregivers and Household Members of Individuals Confirmed to have, or Being Evaluated for, COVID-19 Infection Being Cared for in the Home  If you live with, or provide care at home for, a person confirmed to have, or being evaluated for, COVID-19 infection please follow these guidelines to prevent infection:  Follow healthcare provider's instructions Make sure that you understand and can help the patient follow any healthcare provider  instructions for all care.  Provide for the patient's basic needs You should help the patient with basic needs in the home and provide support for getting groceries, prescriptions, and other personal needs.  Monitor  the patient's symptoms If they are getting sicker, call his or her medical provider and tell them that the patient has, or is being evaluated for, COVID-19 infection. This will help the healthcare provider's office take steps to keep other people from getting infected. Ask the healthcare provider to call the local or state health department.  Limit the number of people who have contact with the patient If possible, have only one caregiver for the patient. Other household members should stay in another home or place of residence. If this is not possible, they should stay in another room, or be separated from the patient as much as possible. Use a separate bathroom, if available. Restrict visitors who do not have an essential need to be in the home.  Keep older adults, very young children, and other sick people away from the patient Keep older adults, very young children, and those who have compromised immune systems or chronic health conditions away from the patient. This includes people with chronic heart, lung, or kidney conditions, diabetes, and cancer.  Ensure good ventilation Make sure that shared spaces in the home have good air flow, such as from an air conditioner or an opened window, weather permitting.  Wash your hands often Wash your hands often and thoroughly with soap and water for at least 20 seconds. You can use an alcohol based hand sanitizer if soap and water are not available and if your hands are not visibly dirty. Avoid touching your eyes, nose, and mouth with unwashed hands. Use disposable paper towels to dry your hands. If not available, use dedicated cloth towels and replace them when they become wet.  Wear a facemask and gloves Wear a disposable facemask at all times in the room and gloves when you touch or have contact with the patient's blood, body fluids, and/or secretions or excretions, such as sweat, saliva, sputum, nasal mucus, vomit, urine, or feces.  Ensure the  mask fits over your nose and mouth tightly, and do not touch it during use. Throw out disposable facemasks and gloves after using them. Do not reuse. Wash your hands immediately after removing your facemask and gloves. If your personal clothing becomes contaminated, carefully remove clothing and launder. Wash your hands after handling contaminated clothing. Place all used disposable facemasks, gloves, and other waste in a lined container before disposing them with other household waste. Remove gloves and wash your hands immediately after handling these items.  Do not share dishes, glasses, or other household items with the patient Avoid sharing household items. You should not share dishes, drinking glasses, cups, eating utensils, towels, bedding, or other items with a patient who is confirmed to have, or being evaluated for, COVID-19 infection. After the person uses these items, you should wash them thoroughly with soap and water.  Wash laundry thoroughly Immediately remove and wash clothes or bedding that have blood, body fluids, and/or secretions or excretions, such as sweat, saliva, sputum, nasal mucus, vomit, urine, or feces, on them. Wear gloves when handling laundry from the patient. Read and follow directions on labels of laundry or clothing items and detergent. In general, wash and dry with the warmest temperatures recommended on the label.  Clean all areas the individual has used often Clean all touchable surfaces, such as counters, tabletops, doorknobs, bathroom  fixtures, toilets, phones, keyboards, tablets, and bedside tables, every day. Also, clean any surfaces that may have blood, body fluids, and/or secretions or excretions on them. Wear gloves when cleaning surfaces the patient has come in contact with. Use a diluted bleach solution (e.g., dilute bleach with 1 part bleach and 10 parts water) or a household disinfectant with a label that says EPA-registered for coronaviruses. To make  a bleach solution at home, add 1 tablespoon of bleach to 1 quart (4 cups) of water. For a larger supply, add  cup of bleach to 1 gallon (16 cups) of water. Read labels of cleaning products and follow recommendations provided on product labels. Labels contain instructions for safe and effective use of the cleaning product including precautions you should take when applying the product, such as wearing gloves or eye protection and making sure you have good ventilation during use of the product. Remove gloves and wash hands immediately after cleaning.  Monitor yourself for signs and symptoms of illness Caregivers and household members are considered close contacts, should monitor their health, and will be asked to limit movement outside of the home to the extent possible. Follow the monitoring steps for close contacts listed on the symptom monitoring form.   ? If you have additional questions, contact your local health department or call the epidemiologist on call at [phone] (available 24/7). ? This guidance is subject to change. For the most up-to-date guidance from [facility], please refer to their website: TripMetro.huhttps://www.cdc.gov/coronavirus/2019-ncov/hcp/guidance-prevent-spread.html

## 2019-12-26 NOTE — ED Triage Notes (Signed)
Pt c/o sore throat, cough, HA, nasal congestion, chills x 3 days.  States had fever of 101 couple days ago - not since then.

## 2019-12-27 LAB — NOVEL CORONAVIRUS, NAA (HOSP ORDER, SEND-OUT TO REF LAB; TAT 18-24 HRS): SARS-CoV-2, NAA: NOT DETECTED

## 2019-12-27 NOTE — ED Provider Notes (Signed)
[facility]    CSN: [id number] Arrival date & time: 12/26/19  1135      History   Chief Complaint Chief Complaint  Patient presents with  . Fever  . Nasal Congestion    HPI Nicole Harris is a 23 y.o. female history of ADHD, presenting today for evaluation of URI symptoms and chills.  Patient states that over the past 4 to 5 days she has had a sore throat, symptoms have progressed into cough, headache, congestion and rhinorrhea.  She has had chills and noted a fever up to 101 last night.  She has been taking Tylenol, but has not used any other over-the-counter medicines for symptoms.  She is notes that her son has been sick, but his symptoms were attributed to allergies.  She denies any known exposure to Covid.  She denies any chest pain or shortness of breath.  Patient would also like to check a pregnancy test as her last menstrual cycle was 11/22.  She notes that since she gave birth approximately 10 months ago she has had irregular cycles.  She just recently stopped breast-feeding.  HPI  Past Medical History:  Diagnosis Date  . ADHD (attention deficit hyperactivity disorder)   . Anemia   . Anxiety   . Depression     Patient Active Problem List   Diagnosis Date Noted  . Indication for care in labor or delivery 02/01/2019  . GBS (group B Streptococcus carrier), +RV culture, currently pregnant 01/08/2019  . Anemia in pregnancy 11/04/2018  . Encounter for supervision of normal first pregnancy in second trimester 07/10/2018  . Nausea/vomiting in pregnancy 07/10/2018    Past Surgical History:  Procedure Laterality Date  . ADENOIDECTOMY    . TONGUE SURGERY    . TONSILLECTOMY      OB History    Gravida  1   Para  1   Term  1   Preterm      AB      Living  1     SAB      TAB      Ectopic      Multiple  0   Live Births  1            Home Medications    Prior to Admission medications   Medication Sig Start Date End Date Taking?  Authorizing Provider  benzonatate (TESSALON) 200 MG capsule Take 1 capsule (200 mg total) by mouth 3 (three) times daily as needed for up to 7 days for cough. 12/26/19 01/02/20  Averi Kilty C, PA-C  Cetirizine HCl 10 MG CAPS Take 1 capsule (10 mg total) by mouth daily for 10 days. 12/26/19 01/05/20  Kaliyan Osbourn C, PA-C  fluticasone (FLONASE) 50 MCG/ACT nasal spray Place 1-2 sprays into both nostrils daily for 7 days. 12/26/19 01/02/20  Jaysa Kise C, PA-C  ibuprofen (ADVIL) 800 MG tablet Take 1 tablet (800 mg total) by mouth 3 (three) times daily. 12/26/19   Trig Mcbryar C, PA-C  ferrous sulfate 325 (65 FE) MG tablet Take 1 tablet (325 mg total) by mouth 2 (two) times daily with a meal. 01/06/19 12/26/19  Raelyn Mora, CNM    Family History Family History  Problem Relation Age of Onset  . Diabetes Paternal Grandmother   . Drug abuse Mother   . Cancer Maternal Grandmother   . Thyroid disease Maternal Grandmother   . Ovarian cancer Maternal Grandmother   . Hypertension Paternal Grandfather     Social  History Social History   Tobacco Use  . Smoking status: Former Games developermoker  . Smokeless tobacco: Never Used  Substance Use Topics  . Alcohol use: Yes    Comment: occasionally  . Drug use: No     Allergies   Latex and Shellfish allergy   Review of Systems Review of Systems  Constitutional: Positive for chills and fever. Negative for activity change, appetite change and fatigue.  HENT: Positive for congestion, rhinorrhea and sore throat. Negative for ear pain, sinus pressure and trouble swallowing.   Eyes: Negative for discharge and redness.  Respiratory: Positive for cough. Negative for chest tightness and shortness of breath.   Cardiovascular: Negative for chest pain.  Gastrointestinal: Negative for abdominal pain, diarrhea, nausea and vomiting.  Genitourinary: Positive for menstrual problem.  Musculoskeletal: Negative for myalgias.  Skin: Negative for rash.    Neurological: Negative for dizziness, light-headedness and headaches.     Physical Exam Triage Vital Signs ED Triage Vitals  Enc Vitals Group     BP 12/26/19 1328 111/60     Pulse Rate 12/26/19 1328 97     Resp 12/26/19 1328 18     Temp 12/26/19 1328 98.4 F (36.9 C)     Temp Source 12/26/19 1328 Oral     SpO2 12/26/19 1328 99 %     Weight --      Height --      Head Circumference --      Peak Flow --      Pain Score 12/26/19 1330 6     Pain Loc --      Pain Edu? --      Excl. in GC? --    No data found.  Updated Vital Signs BP 111/60   Pulse 97   Temp 98.4 F (36.9 C) (Oral)   Resp 18   LMP 12/26/2019   SpO2 99%   Breastfeeding No   Visual Acuity Right Eye Distance:   Left Eye Distance:   Bilateral Distance:    Right Eye Near:   Left Eye Near:    Bilateral Near:     Physical Exam Vitals and nursing note reviewed.  Constitutional:      General: She is not in acute distress.    Appearance: She is well-developed.  HENT:     Head: Normocephalic and atraumatic.     Ears:     Comments: Bilateral ears without tenderness to palpation of external auricle, tragus and mastoid, EAC's without erythema or swelling, TM's with good bony landmarks and cone of light. Non erythematous.     Mouth/Throat:     Comments: Oral mucosa pink and moist, no tonsillar enlargement or exudate. Posterior pharynx patent and nonerythematous, no uvula deviation or swelling. Normal phonation. Eyes:     Conjunctiva/sclera: Conjunctivae normal.  Cardiovascular:     Rate and Rhythm: Normal rate and regular rhythm.     Heart sounds: No murmur.  Pulmonary:     Effort: Pulmonary effort is normal. No respiratory distress.     Breath sounds: Normal breath sounds.     Comments: Breathing comfortably at rest, CTABL, no wheezing, rales or other adventitious sounds auscultated Abdominal:     Palpations: Abdomen is soft.     Tenderness: There is no abdominal tenderness.  Musculoskeletal:      Cervical back: Neck supple.  Skin:    General: Skin is warm and dry.  Neurological:     Mental Status: She is alert.      UC  Treatments / Results  Labs (all labs ordered are listed, but only abnormal results are displayed) Labs Reviewed  NOVEL CORONAVIRUS, NAA (HOSP ORDER, SEND-OUT TO REF LAB; TAT 18-24 HRS)  POC URINE PREG, ED  POCT PREGNANCY, URINE    EKG   Radiology No results found.  Procedures Procedures (including critical care time)  Medications Ordered in UC Medications - No data to display  Initial Impression / Assessment and Plan / UC Course  I have reviewed the triage vital signs and the nursing notes.  Pertinent labs & imaging results that were available during my care of the patient were reviewed by me and considered in my medical decision making (see chart for details).     Pregnancy test negative.  Covid PCR pending.  URI symptoms less than 1 week, exam unremarkable, vital signs stable in clinic today.  Likely viral etiology and recommending continue symptomatic and supportive care.  Rest, push fluids.  Provided Flonase and Zyrtec for congestion, Tessalon for cough.  Quarantine until [employer] results return.  Discussed recommendations if test positive.  Discussed strict return precautions. Patient verbalized understanding and is agreeable with plan.  Final Clinical Impressions(s) / UC Diagnoses   Final diagnoses:  Viral URI with cough     Discharge Instructions     Pregnancy test negative Covid swab pending, quarantine until results return, approximately 2 days, monitor my chart Begin daily Flonase and cetirizine to help with nasal congestion and drainage Tessalon every 8 hours as needed for cough Tylenol and ibuprofen for fevers, body aches/chills and headaches Rest and drink plenty of fluids  Follow-up if symptoms not improving, worsening, persistent fevers, shortness of breath or difficulty breathing     Person Under Monitoring Name: Nicole Harris  Location: [address] [place] [address]   Infection Prevention Recommendations for Individuals Confirmed to have, or Being Evaluated for, 2019 Novel Coronavirus (COVID-19) Infection Who Receive Care at Home  Individuals who are confirmed to have, or are being evaluated for, COVID-19 should follow the prevention steps below until a healthcare provider or local or state health department says they can return to normal activities.  Stay home except to get medical care You should restrict activities outside your home, except for getting medical care. Do not go to work, school, or public areas, and do not use public transportation or taxis.  Call ahead before visiting your doctor Before your medical appointment, call the healthcare provider and tell them that you have, or are being evaluated for, COVID-19 infection. This will help the healthcare provider's office take steps to keep other people from getting infected. Ask your healthcare provider to call the local or state health department.  Monitor your symptoms Seek prompt medical attention if your illness is worsening (e.g., difficulty breathing). Before going to your medical appointment, call the healthcare provider and tell them that you have, or are being evaluated for, COVID-19 infection. Ask your healthcare provider to call the local or state health department.  Wear a facemask You should wear a facemask that covers your nose and mouth when you are in the same room with other people and when you visit a healthcare provider. People who live with or visit you should also wear a facemask while they are in the same room with you.  Separate yourself from other people in your home As much as possible, you should stay in a different room from other people in your home. Also, you should use a separate bathroom,  if available.  Avoid sharing household items You should not share dishes, drinking glasses,  cups, eating utensils, towels, bedding, or other items with other people in your home. After using these items, you should wash them thoroughly with soap and water.  Cover your coughs and sneezes Cover your mouth and nose with a tissue when you cough or sneeze, or you can cough or sneeze into your sleeve. Throw used tissues in a lined trash can, and immediately wash your hands with soap and water for at least 20 seconds or use an alcohol-based hand rub.  Wash your [employer] your hands often and thoroughly with soap and water for at least 20 seconds. You can use an alcohol-based hand sanitizer if soap and water are not available and if your hands are not visibly dirty. Avoid touching your eyes, nose, and mouth with unwashed hands.   Prevention Steps for Caregivers and Household Members of Individuals Confirmed to have, or Being Evaluated for, COVID-19 Infection Being Cared for in the Home  If you live with, or provide care at home for, a person confirmed to have, or being evaluated for, COVID-19 infection please follow these guidelines to prevent infection:  Follow healthcare provider's instructions Make sure that you understand and can help the patient follow any healthcare provider instructions for all care.  Provide for the patient's basic needs You should help the patient with basic needs in the home and provide support for getting groceries, prescriptions, and other personal needs.  Monitor the patient's symptoms If they are getting sicker, call his or her medical provider and tell them that the patient has, or is being evaluated for, COVID-19 infection. This will help the healthcare provider's office take steps to keep other people from getting infected. Ask the healthcare provider to call the local or state health department.  Limit the number of people who have contact with the patient  If possible, have only one caregiver for the patient.  Other household members should  stay in another home or place of residence. If this is not possible, they should stay  in another room, or be separated from the patient as much as possible. Use a separate bathroom, if available.  Restrict visitors who do not have an essential need to be in the home.  Keep older adults, very young children, and other sick people away from the patient Keep older adults, very young children, and those who have compromised immune systems or chronic health conditions away from the patient. This includes people with chronic heart, lung, or kidney conditions, diabetes, and cancer.  Ensure good ventilation Make sure that shared spaces in the home have good air flow, such as from an air conditioner or an opened window, weather permitting.  Wash your hands often  Wash your hands often and thoroughly with soap and water for at least 20 seconds. You can use an alcohol based hand sanitizer if soap and water are not available and if your hands are not visibly dirty.  Avoid touching your eyes, nose, and mouth with unwashed hands.  Use disposable paper towels to dry your hands. If not available, use dedicated cloth towels and replace them when they become wet.  Wear a facemask and gloves  Wear a disposable facemask at all times in the room and gloves when you touch or have contact with the patient's blood, body fluids, and/or secretions or excretions, such as sweat, saliva, sputum, nasal mucus, vomit, urine, or feces.  Ensure the mask fits over  your nose and mouth tightly, and do not touch it during use.  Throw out disposable facemasks and gloves after using them. Do not reuse.  Wash your hands immediately after removing your facemask and gloves.  If your personal clothing becomes contaminated, carefully remove clothing and launder. Wash your hands after handling contaminated clothing.  Place all used disposable facemasks, gloves, and other waste in a lined container before disposing them with other  household waste.  Remove gloves and wash your hands immediately after handling these items.  Do not share dishes, glasses, or other household items with the patient  Avoid sharing household items. You should not share dishes, drinking glasses, cups, eating utensils, towels, bedding, or other items with a patient who is confirmed to have, or being evaluated for, COVID-19 infection.  After the person uses these items, you should wash them thoroughly with soap and water.  Wash laundry thoroughly  Immediately remove and wash clothes or bedding that have blood, body fluids, and/or secretions or excretions, such as sweat, saliva, sputum, nasal mucus, vomit, urine, or feces, on them.  Wear gloves when handling laundry from the patient.  Read and follow directions on labels of laundry or clothing items and detergent. In general, wash and dry with the warmest temperatures recommended on the label.  Clean all areas the individual has used often  Clean all touchable surfaces, such as counters, tabletops, doorknobs, bathroom fixtures, toilets, phones, keyboards, tablets, and bedside tables, every day. Also, clean any surfaces that may have blood, body fluids, and/or secretions or excretions on them.  Wear gloves when cleaning surfaces the patient has come in contact with.  Use a diluted bleach solution (e.g., dilute bleach with 1 part bleach and 10 parts water) or a household disinfectant with a label that says EPA-registered for coronaviruses. To make a bleach solution at home, add 1 tablespoon of bleach to 1 quart (4 cups) of water. For a larger supply, add  cup of bleach to 1 gallon (16 cups) of water.  Read labels of cleaning products and follow recommendations provided on product labels. Labels contain instructions for safe and effective use of the cleaning product including precautions you should take when applying the product, such as wearing gloves or eye protection and making sure you have  good ventilation during use of the product.  Remove gloves and wash hands immediately after cleaning.  Monitor yourself for signs and symptoms of illness Caregivers and household members are considered close contacts, should monitor their health, and will be asked to limit movement outside of the home to the extent possible. Follow the monitoring steps for close contacts listed on the symptom monitoring form.   ? If you have additional questions, contact your local health department or call the epidemiologist on call at [phone] (available 24/7). ? This guidance is subject to change. For the most up-to-date guidance from [facility], please refer to their website: YouBlogs.pl    ED Prescriptions    Medication Sig Dispense Auth. Provider   ibuprofen (ADVIL) 800 MG tablet Take 1 tablet (800 mg total) by mouth 3 (three) times daily. 21 tablet Hillman Attig C, PA-C   fluticasone (FLONASE) 50 MCG/ACT nasal spray Place 1-2 sprays into both nostrils daily for 7 days. 1 g Josefita Weissmann C, PA-C   Cetirizine HCl 10 MG CAPS Take 1 capsule (10 mg total) by mouth daily for 10 days. 10 capsule Berdine Rasmusson C, PA-C   benzonatate (TESSALON) 200 MG capsule Take 1 capsule (200 mg total)  by mouth 3 (three) times daily as needed for up to 7 days for cough. 28 capsule Katheryn Culliton, [place] C, PA-C     PDMP not reviewed this encounter.   Lew Dawes, [place] 12/27/19 [phone]

## 2020-05-17 ENCOUNTER — Other Ambulatory Visit: Payer: Self-pay

## 2020-05-17 ENCOUNTER — Emergency Department (HOSPITAL_COMMUNITY): Payer: No Typology Code available for payment source

## 2020-05-17 ENCOUNTER — Encounter (HOSPITAL_COMMUNITY): Payer: Self-pay

## 2020-05-17 DIAGNOSIS — Y92410 Unspecified street and highway as the place of occurrence of the external cause: Secondary | ICD-10-CM | POA: Insufficient documentation

## 2020-05-17 DIAGNOSIS — Y999 Unspecified external cause status: Secondary | ICD-10-CM | POA: Insufficient documentation

## 2020-05-17 DIAGNOSIS — Z79899 Other long term (current) drug therapy: Secondary | ICD-10-CM | POA: Insufficient documentation

## 2020-05-17 DIAGNOSIS — Z87891 Personal history of nicotine dependence: Secondary | ICD-10-CM | POA: Diagnosis not present

## 2020-05-17 DIAGNOSIS — Y93I9 Activity, other involving external motion: Secondary | ICD-10-CM | POA: Insufficient documentation

## 2020-05-17 DIAGNOSIS — M542 Cervicalgia: Secondary | ICD-10-CM | POA: Insufficient documentation

## 2020-05-17 DIAGNOSIS — F909 Attention-deficit hyperactivity disorder, unspecified type: Secondary | ICD-10-CM | POA: Diagnosis not present

## 2020-05-17 DIAGNOSIS — R519 Headache, unspecified: Secondary | ICD-10-CM | POA: Insufficient documentation

## 2020-05-17 DIAGNOSIS — Z91040 Latex allergy status: Secondary | ICD-10-CM | POA: Insufficient documentation
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
## 2020-05-18 ENCOUNTER — Emergency Department (HOSPITAL_COMMUNITY): Payer: No Typology Code available for payment source

## 2020-05-18 ENCOUNTER — Emergency Department (HOSPITAL_COMMUNITY)
Admission: EM | Admit: 2020-05-18 | Discharge: 2020-05-18 | Disposition: A | Discharge status: Home / Self Care | Payer: No Typology Code available for payment source | Attending: Emergency Medicine | Admitting: Emergency Medicine

## 2020-05-18 DIAGNOSIS — R519 Headache, unspecified: Secondary | ICD-10-CM | POA: Diagnosis not present

## 2020-05-18 MED ORDER — METHOCARBAMOL 500 MG PO TABS
500.0000 mg | ORAL_TABLET | Freq: Once | ORAL | Status: AC
  Administered 2020-05-18: 500 mg via ORAL
  Filled 2020-05-18: qty 1

## 2020-05-18 MED ORDER — ONDANSETRON 4 MG PO TBDP
4.0000 mg | ORAL_TABLET | Freq: Once | ORAL | Status: AC
  Administered 2020-05-18: 4 mg via ORAL
  Filled 2020-05-18: qty 1

## 2020-05-18 MED ORDER — HYDROCODONE-ACETAMINOPHEN 5-325 MG PO TABS
1.0000 | ORAL_TABLET | Freq: Once | ORAL | Status: AC
  Administered 2020-05-18: 1 via ORAL
  Filled 2020-05-18: qty 1

## 2020-05-18 MED ORDER — METHOCARBAMOL 500 MG PO TABS: 500.0000 mg | ORAL_TABLET | Freq: Two times a day (BID) | ORAL | 0 refills | Status: DC

## 2020-05-18 MED ORDER — LIDOCAINE 5 % EX PTCH
1.0000 | MEDICATED_PATCH | Freq: Once | CUTANEOUS | Status: DC
  Administered 2020-05-18: 1 via TRANSDERMAL
  Filled 2020-05-18: qty 1

## 2020-05-18 NOTE — Discharge Instructions (Addendum)
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
## 2020-06-11 ENCOUNTER — Encounter (HOSPITAL_COMMUNITY): Payer: Self-pay

## 2020-06-11 ENCOUNTER — Other Ambulatory Visit: Payer: Self-pay

## 2020-06-11 ENCOUNTER — Inpatient Hospital Stay (HOSPITAL_COMMUNITY)
Admission: AD | Admit: 2020-06-11 | Discharge: 2020-06-11 | Disposition: A | Discharge status: Home / Self Care | Payer: No Typology Code available for payment source | Attending: Family Medicine | Admitting: Family Medicine

## 2020-06-11 ENCOUNTER — Ambulatory Visit (HOSPITAL_COMMUNITY)
Admission: EM | Admit: 2020-06-11 | Discharge: 2020-06-11 | Disposition: A | Discharge status: Home / Self Care | Payer: Medicaid Other | Attending: Emergency Medicine | Admitting: Emergency Medicine

## 2020-06-11 DIAGNOSIS — Z3201 Encounter for pregnancy test, result positive: Secondary | ICD-10-CM

## 2020-06-11 DIAGNOSIS — Z3200 Encounter for pregnancy test, result unknown: Secondary | ICD-10-CM

## 2020-06-11 DIAGNOSIS — Z3A01 Less than 8 weeks gestation of pregnancy: Secondary | ICD-10-CM | POA: Diagnosis not present

## 2020-06-11 DIAGNOSIS — N912 Amenorrhea, unspecified: Secondary | ICD-10-CM

## 2020-06-11 LAB — POC URINE PREG, ED: Preg Test, Ur: POSITIVE — AB

## 2020-06-11 NOTE — MAU Note (Signed)
Nicole Harris is a 24 y.o. here in MAU reporting: is here for pregnancy confirmation. + UPT at home on June 1. Having some discomfort intermittently on her right side, last time she had this pain was this AM. When it comes it is 3/10 and sharp. No bleeding or discharge.  LMP: 04/28/20  Onset of complaint: a couple days  Pain score: 0/10  Vitals:   06/11/20 1645  BP: (!) 107/58  Pulse: 93  Resp: 16  Temp: 99 F (37.2 C)  SpO2: 98%     Lab orders placed from triage:
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
## 2020-06-11 NOTE — Discharge Instructions (Signed)

## 2020-06-11 NOTE — MAU Provider Note (Addendum)
[name] is a 24 y.o. G1P1001 who present to MAU today for pregnancy confirmation. She denies abdominal pain or vaginal bleeding.   BP (!) 107/58 (BP Location: Right Arm)   Pulse 93   Temp 99 F (37.2 C) (Oral)   Resp 16   Ht 5\' 4"  (1.626 m)   Wt 61.2 kg   LMP 04/28/2020   SpO2 98% Comment: room air  BMI 23.16 kg/m  CONSTITUTIONAL: Well-developed, well-nourished female in no acute distress.  CARDIOVASCULAR: Normal heart rate noted RESPIRATORY: Effort and breath sounds normal GASTROINTESTINAL:Soft, no distention noted.  No tenderness, rebound or guarding.  SKIN: Skin is warm and dry. No rash noted. Not diaphoretic. No erythema. No pallor. PSYCHIATRIC: Normal mood and affect. Normal behavior. Normal judgment and thought content.  MDM Medical screening exam complete Patient does not endorse any symptoms concerning for ectopic pregnancy or pregnancy related complication today.   A:  Amenorrhea  P: Discharge home Patient advised that she can present as a walk-in to [facility] for a pregnancy test M-Th between 8am-4pm or Friday between 8am -11am Reasons to return to MAU reviewed  Patient may return to MAU as needed or if her condition were to change or worsen  Tuesday, Rolm Bookbinder 06/11/2020 4:53 PM

## 2020-06-11 NOTE — Discharge Instructions (Addendum)
Start pre-natal vitamins Follow up with OB

## 2020-06-21 ENCOUNTER — Encounter (HOSPITAL_COMMUNITY): Payer: Self-pay | Admitting: Obstetrics and Gynecology

## 2020-06-21 ENCOUNTER — Inpatient Hospital Stay (HOSPITAL_COMMUNITY): Payer: No Typology Code available for payment source

## 2020-06-21 ENCOUNTER — Other Ambulatory Visit: Payer: Self-pay

## 2020-06-21 ENCOUNTER — Inpatient Hospital Stay (HOSPITAL_COMMUNITY)
Admission: AD | Admit: 2020-06-21 | Discharge: 2020-06-21 | Disposition: A | Discharge status: Home / Self Care | Payer: No Typology Code available for payment source | Attending: Obstetrics and Gynecology | Admitting: Obstetrics and Gynecology

## 2020-06-21 DIAGNOSIS — B9689 Other specified bacterial agents as the cause of diseases classified elsewhere: Secondary | ICD-10-CM | POA: Insufficient documentation

## 2020-06-21 DIAGNOSIS — O4691 Antepartum hemorrhage, unspecified, first trimester: Secondary | ICD-10-CM | POA: Diagnosis not present

## 2020-06-21 DIAGNOSIS — O209 Hemorrhage in early pregnancy, unspecified: Secondary | ICD-10-CM | POA: Insufficient documentation

## 2020-06-21 DIAGNOSIS — F419 Anxiety disorder, unspecified: Secondary | ICD-10-CM | POA: Insufficient documentation

## 2020-06-21 DIAGNOSIS — O4690 Antepartum hemorrhage, unspecified, unspecified trimester: Secondary | ICD-10-CM

## 2020-06-21 DIAGNOSIS — Z87891 Personal history of nicotine dependence: Secondary | ICD-10-CM | POA: Insufficient documentation

## 2020-06-21 DIAGNOSIS — O23591 Infection of other part of genital tract in pregnancy, first trimester: Secondary | ICD-10-CM | POA: Diagnosis not present

## 2020-06-21 DIAGNOSIS — O99341 Other mental disorders complicating pregnancy, first trimester: Secondary | ICD-10-CM | POA: Diagnosis not present

## 2020-06-21 DIAGNOSIS — N760 Acute vaginitis: Secondary | ICD-10-CM

## 2020-06-21 DIAGNOSIS — D649 Anemia, unspecified: Secondary | ICD-10-CM | POA: Insufficient documentation

## 2020-06-21 DIAGNOSIS — O3481 Maternal care for other abnormalities of pelvic organs, first trimester: Secondary | ICD-10-CM | POA: Insufficient documentation

## 2020-06-21 DIAGNOSIS — F319 Bipolar disorder, unspecified: Secondary | ICD-10-CM | POA: Insufficient documentation

## 2020-06-21 DIAGNOSIS — N83202 Unspecified ovarian cyst, left side: Secondary | ICD-10-CM | POA: Diagnosis not present

## 2020-06-21 DIAGNOSIS — Z3A01 Less than 8 weeks gestation of pregnancy: Secondary | ICD-10-CM | POA: Diagnosis not present

## 2020-06-21 DIAGNOSIS — O99011 Anemia complicating pregnancy, first trimester: Secondary | ICD-10-CM | POA: Insufficient documentation

## 2020-06-21 DIAGNOSIS — O99891 Other specified diseases and conditions complicating pregnancy: Secondary | ICD-10-CM

## 2020-06-21 HISTORY — DX: Bipolar disorder, unspecified: F31.9

## 2020-06-21 LAB — URINALYSIS, ROUTINE W REFLEX MICROSCOPIC
Bilirubin Urine: NEGATIVE
Glucose, UA: NEGATIVE mg/dL
Hgb urine dipstick: NEGATIVE
Ketones, ur: NEGATIVE mg/dL
Nitrite: NEGATIVE
Protein, ur: NEGATIVE mg/dL
Specific Gravity, Urine: 1.031 — ABNORMAL HIGH (ref 1.005–1.030)
pH: 5 (ref 5.0–8.0)

## 2020-06-21 LAB — HCG, QUANTITATIVE, PREGNANCY: hCG, Beta Chain, Quant, S: 112348 m[IU]/mL — ABNORMAL HIGH (ref <5)

## 2020-06-21 LAB — WET PREP, GENITAL
Sperm: NONE SEEN
Trich, Wet Prep: NONE SEEN
Yeast Wet Prep HPF POC: NONE SEEN

## 2020-06-21 LAB — CBC
HCT: 40 % (ref 36.0–46.0)
Hemoglobin: 13.8 g/dL (ref 12.0–15.0)
MCH: 31.2 pg (ref 26.0–34.0)
MCHC: 34.5 g/dL (ref 30.0–36.0)
MCV: 90.5 fL (ref 80.0–100.0)
Platelets: 253 10*3/uL (ref 150–400)
RBC: 4.42 MIL/uL (ref 3.87–5.11)
RDW: 11.8 % (ref 11.5–15.5)
WBC: 8 10*3/uL (ref 4.0–10.5)
nRBC: 0 % (ref 0.0–0.2)

## 2020-06-21 MED ORDER — METRONIDAZOLE 500 MG PO TABS: 500.0000 mg | ORAL_TABLET | Freq: Two times a day (BID) | ORAL | 0 refills | Status: DC

## 2020-06-21 NOTE — MAU Provider Note (Signed)
History     CSN: [id number]  Arrival date and time: 06/21/20 1309   First Provider Initiated Contact with Patient 06/21/20 1348      Chief Complaint  Patient presents with  . Cramping  . Spotting   Nicole Harris is a 24 y.o. G2P1 at [redacted]w[redacted]d by LMP who presents to MAU with complaints of vaginal bleeding and cramping. Patient reports symptoms started occurring this afternoon while at the grocery store about an hour ago. She reports feeling something wet in her underwear, went to restroom and noticed vaginal bleeding when she wiped. She describes bleeding as light pink spotting, since occurrence she denies vaginal spotting. She reports lower abdominal cramping that is more specific to LLQ, rates pain 4/10 - has not taken any medication for abdominal pain. She reports last IC yesterday. She denies urinary symptoms or any other complaints.    OB History    Gravida  2   Para  1   Term  1   Preterm      AB      Living  1     SAB      TAB      Ectopic      Multiple  0   Live Births  1           Past Medical History:  Diagnosis Date  . ADHD (attention deficit hyperactivity disorder)   . Anemia   . Anxiety   . Bipolar disorder (HCC)   . Depression     Past Surgical History:  Procedure Laterality Date  . ADENOIDECTOMY    . TONGUE SURGERY    . TONSILLECTOMY      Family History  Problem Relation Age of Onset  . Diabetes Paternal Grandmother   . Drug abuse Mother   . Cancer Maternal Grandmother   . Thyroid disease Maternal Grandmother   . Ovarian cancer Maternal Grandmother   . Hypertension Paternal Grandfather     Social History   Tobacco Use  . Smoking status: Former Smoker    Types: Cigarettes  . Smokeless tobacco: Never Used  Vaping Use  . Vaping Use: Never used  Substance Use Topics  . Alcohol use: Yes    Comment: occasionally  . Drug use: No    Allergies:  Allergies  Allergen Reactions  . Latex Rash  . Shellfish Allergy Rash     Medications Prior to Admission  Medication Sig Dispense Refill Last Dose  . Cetirizine HCl 10 MG CAPS Take 1 capsule (10 mg total) by mouth daily for 10 days. 10 capsule 0   . fluticasone (FLONASE) 50 MCG/ACT nasal spray Place 1-2 sprays into both nostrils daily for 7 days. 1 g 0     Review of Systems  Constitutional: Negative.   Respiratory: Negative.   Cardiovascular: Negative.   Gastrointestinal: Positive for abdominal pain. Negative for constipation, diarrhea, nausea and vomiting.  Genitourinary: Positive for vaginal bleeding. Negative for difficulty urinating, dysuria, frequency, pelvic pain, urgency and vaginal discharge.  Musculoskeletal: Negative.   Neurological: Negative.   Psychiatric/Behavioral: Negative.    Physical Exam   Blood pressure (!) 98/46, pulse 80, temperature 98.4 F (36.9 C), temperature source Oral, resp. rate 18, height 5\' 4"  (1.626 m), weight 60.5 kg, last menstrual period 04/28/2020, SpO2 100 %, not currently breastfeeding.  Physical Exam  Nursing note and vitals reviewed. Constitutional: She is oriented to person, place, and time.  HENT:  Head: Normocephalic.  Cardiovascular: Normal rate and regular rhythm.  Respiratory: Effort normal and breath sounds normal. No respiratory distress. She has no wheezes.  GI: Soft. She exhibits no distension and no mass. There is abdominal tenderness. There is no guarding.  Genitourinary:    Vagina and cervix normal.     Genitourinary Comments: Pelvic exam: Cervix pink, visually closed, without lesion, scant white creamy discharge, vaginal walls and external genitalia normal Bimanual exam: Cervix 0/long/high, firm, anterior, neg CMT, uterus nontender, nonenlarged,right adnexa without tenderness, enlargement, or mass, left adnexa with tenderness with palpation, no enlargement, or mass palpated.   Neurological: She is alert and oriented to person, place, and time.  Skin: Skin is warm and dry.  Psychiatric: Her  behavior is normal. Mood and thought content normal.    MAU Course  Procedures  MDM Orders Placed This Encounter  Procedures  . Wet prep, genital  . US OB LESS THAN 14 WEEKS WITH OB TRANSVAGINAL  . CBC  . hCG, quantitative, pregnancy  . Urinalysis, Routine w reflex microscopic   Labs and [place] report reviewed:  Results for orders placed or performed during the hospital encounter of 06/21/20 (from the past 24 hour(s))  CBC     Status: None   Collection Time: 06/21/20  1:54 PM  Result Value Ref Range   WBC 8.0 4.0 - 10.5 K/uL   RBC 4.42 3.87 - 5.11 MIL/uL   Hemoglobin 13.8 12.0 - 15.0 g/dL   HCT 58.5 36 - 46 %   MCV 90.5 80.0 - 100.0 fL   MCH 31.2 26.0 - 34.0 pg   MCHC 34.5 30.0 - 36.0 g/dL   RDW 27.7 82.4 - 23.5 %   Platelets 253 150 - 400 K/uL   nRBC 0.0 0.0 - 0.2 %  hCG, quantitative, pregnancy     Status: Abnormal   Collection Time: 06/21/20  1:54 PM  Result Value Ref Range   hCG, Beta Chain, Quant, S 112,348 (H) <5 mIU/mL  Wet prep, genital     Status: Abnormal   Collection Time: 06/21/20  1:58 PM   Specimen: PATH Cytology Cervicovaginal Ancillary Only  Result Value Ref Range   Yeast Wet Prep HPF POC NONE SEEN NONE SEEN   Trich, Wet Prep NONE SEEN NONE SEEN   Clue Cells Wet Prep HPF POC PRESENT (A) NONE SEEN   WBC, Wet Prep HPF POC MODERATE (A) NONE SEEN   Sperm NONE SEEN   Urinalysis, Routine w reflex microscopic     Status: Abnormal   Collection Time: 06/21/20  1:58 PM  Result Value Ref Range   Color, Urine YELLOW YELLOW   APPearance HAZY (A) CLEAR   Specific Gravity, Urine 1.031 (H) 1.005 - 1.030   pH 5.0 5.0 - 8.0   Glucose, UA NEGATIVE NEGATIVE mg/dL   Hgb urine dipstick NEGATIVE NEGATIVE   Bilirubin Urine NEGATIVE NEGATIVE   Ketones, ur NEGATIVE NEGATIVE mg/dL   Protein, ur NEGATIVE NEGATIVE mg/dL   Nitrite NEGATIVE NEGATIVE   Leukocytes,Ua TRACE (A) NEGATIVE   RBC / HPF 0-5 0 - 5 RBC/hpf   WBC, UA 0-5 0 - 5 WBC/hpf   Bacteria, UA RARE (A) NONE SEEN    Squamous Epithelial / LPF 11-20 0 - 5   Mucus PRESENT    Ca Oxalate Crys, UA PRESENT    US OB Comp Less 14 Wks  Result Date: 06/21/2020 CLINICAL DATA:  Vaginal bleeding during first trimester of pregnancy EXAM: OBSTETRIC <14 WK ULTRASOUND TECHNIQUE: Transabdominal ultrasound was performed for evaluation of the gestation as well  as the maternal uterus and adnexal regions. COMPARISON:  None for this gestation FINDINGS: Intrauterine gestational sac: Present, single Yolk sac:  Present Embryo:  Present Cardiac Activity: Present Heart Rate: 133 bpm CRL:   7.4 mm   6 w 4 d                  [place] EDC: 02/10/2021 Subchorionic hemorrhage:  None visualized. Maternal uterus/adnexae: Maternal uterus otherwise unremarkable. RIGHT ovary normal size and morphology 4.4 x 2.6 x 2.9 cm, containing a dominant follicle. LEFT ovary measures 3.9 x 3.5 x 3.4 cm and contains a simple cyst 3.1 x 2.7 x 3.1 cm. No free pelvic fluid or adnexal masses. IMPRESSION: Single live intrauterine gestation at 6 weeks 4 days EGA by crown-rump length. Small LEFT ovarian cyst and dominant RIGHT ovarian follicle without additional adnexal masses. Electronically Signed   By: Lavonia Dana M.D.   On: 06/21/2020 15:34   Discussed results of [place] and labs with patient. Dating changed based on ultrasound today. Educated and discussed treatment of BV during pregnancy, Rx for Flagyl sent to pharmacy of choice. Discussed pelvic rest until medication is completed, patient verbalizes understanding.  Discussed with patient simple cyst that was found on left ovary, discussed use of tylenol as needed for pain. Warning signs and reasons to present to MAU discussed.   Encouraged to make initial prenatal visit. Return to MAU as needed. Pt stable at time of discharge.   Assessment and Plan   1. [redacted] weeks gestation of pregnancy   2. Vaginal bleeding during pregnancy   3. Left ovarian cyst   4. Bacterial vaginosis    Discharge home Make initial prenatal visit  appointment  Return to MAU as needed for reasons discussed and/or emergencies     Allergies as of 06/21/2020      Reactions   Latex Rash   Shellfish Allergy Rash      Medication List    TAKE these medications   Cetirizine HCl 10 MG Caps Take 1 capsule (10 mg total) by mouth daily for 10 days.   fluticasone 50 MCG/ACT nasal spray Commonly known as: FLONASE Place 1-2 sprays into both nostrils daily for 7 days.   metroNIDAZOLE 500 MG tablet Commonly known as: FLAGYL Take 1 tablet (500 mg total) by mouth 2 (two) times daily.        Lajean Manes CNM 06/21/2020, 4:44 PM

## 2020-06-21 NOTE — MAU Note (Signed)
Presents for c/o spotting & cramping.  Reports noticed blood in toilet and afterwards with wiping.  States has since wiped and no spotting noted.  LMP 04/28/2020.

## 2020-06-22 LAB — GC/CHLAMYDIA PROBE AMP (~~LOC~~) NOT AT ARMC
Chlamydia: NEGATIVE
Comment: NEGATIVE
Comment: NORMAL
Neisseria Gonorrhea: NEGATIVE

## 2020-08-23 ENCOUNTER — Ambulatory Visit (INDEPENDENT_AMBULATORY_CARE_PROVIDER_SITE_OTHER): Payer: No Typology Code available for payment source | Admitting: *Deleted

## 2020-08-23 ENCOUNTER — Other Ambulatory Visit (HOSPITAL_COMMUNITY)
Admission: RE | Admit: 2020-08-23 | Discharge: 2020-08-23 | Disposition: A | Discharge status: Home / Self Care | Payer: Medicaid Other | Source: Ambulatory Visit | Attending: Obstetrics and Gynecology | Admitting: Obstetrics and Gynecology

## 2020-08-23 ENCOUNTER — Other Ambulatory Visit: Payer: Self-pay

## 2020-08-23 VITALS — BP 98/55 | HR 112 | Temp 98.2°F | Wt 136.0 lb

## 2020-08-23 DIAGNOSIS — Z3480 Encounter for supervision of other normal pregnancy, unspecified trimester: Secondary | ICD-10-CM | POA: Insufficient documentation

## 2020-08-23 DIAGNOSIS — Z113 Encounter for screening for infections with a predominantly sexual mode of transmission: Secondary | ICD-10-CM | POA: Diagnosis present

## 2020-08-23 DIAGNOSIS — Z3402 Encounter for supervision of normal first pregnancy, second trimester: Secondary | ICD-10-CM

## 2020-08-23 MED ORDER — BLOOD PRESSURE MONITOR AUTOMAT DEVI: 1.0000 | Freq: Every day | 0 refills | Status: DC

## 2020-08-23 MED ORDER — VITAFOL GUMMIES 3.33-0.333-34.8 MG PO CHEW: 3.0000 | CHEWABLE_TABLET | Freq: Every day | ORAL | 12 refills | Status: DC

## 2020-08-23 MED ORDER — GOJJI WEIGHT SCALE MISC: 1.0000 | Freq: Every day | 0 refills | Status: DC | PRN

## 2020-08-23 NOTE — Progress Notes (Signed)
   PRENATAL INTAKE SUMMARY  Nicole Harris presents today New OB Nurse Interview.  OB History    Gravida  2   Para  1   Term  1   Preterm      AB      Living  1     SAB      TAB      Ectopic      Multiple  0   Live Births  1          I have reviewed the patient's medical, obstetrical, social, and family histories, medications, and available lab results.  SUBJECTIVE She has no unusual complaints. Reported she did not complete full course of Metronidazole. She stated she "could not keep the medication down."  OBJECTIVE Initial nurse interview for history and labs (New OB). LATE TO CARE.  EDD: 02/10/21 by ultrasound GA: [redacted]w[redacted]d G2P1001 FHT: 156  GENERAL APPEARANCE: alert, well appearing, in no apparent distress, oriented to person, place and time   ASSESSMENT Normal pregnancy  PLAN Prenatal care-[facility] Pnl/HIV/Hep C  OB Urine Culture GC/CT (self vaginal swab) PAP at next visit with Steward Drone, CNM 06/14/20 HgbEval/SMA/CF ([facility]) Panorama AFP Rx for BP monitor and weight scale sent to [facility] Rx for prenatal gummies sent to pharmacy  Clovis Pu, RN

## 2020-08-24 LAB — CBC/D/PLT+RPR+RH+ABO+RUB AB...
Antibody Screen: NEGATIVE
Basophils Absolute: 0 10*3/uL (ref 0.0–0.2)
Basos: 0 %
EOS (ABSOLUTE): 0.1 10*3/uL (ref 0.0–0.4)
Eos: 1 %
HCV Ab: 0.2 s/co ratio (ref 0.0–0.9)
HIV Screen 4th Generation wRfx: NONREACTIVE
Hematocrit: 38 % (ref 34.0–46.6)
Hemoglobin: 13.1 g/dL (ref 11.1–15.9)
Hepatitis B Surface Ag: NEGATIVE
Immature Grans (Abs): 0 10*3/uL (ref 0.0–0.1)
Immature Granulocytes: 0 %
Lymphocytes Absolute: 1.8 10*3/uL (ref 0.7–3.1)
Lymphs: 19 %
MCH: 31.8 pg (ref 26.6–33.0)
MCHC: 34.5 g/dL (ref 31.5–35.7)
MCV: 92 fL (ref 79–97)
Monocytes Absolute: 0.5 10*3/uL (ref 0.1–0.9)
Monocytes: 5 %
Neutrophils Absolute: 7 10*3/uL (ref 1.4–7.0)
Neutrophils: 75 %
Platelets: 262 10*3/uL (ref 150–450)
RBC: 4.12 x10E6/uL (ref 3.77–5.28)
RDW: 12.5 % (ref 11.7–15.4)
RPR Ser Ql: NONREACTIVE
Rh Factor: POSITIVE
Rubella Antibodies, IGG: 2.01 index (ref >0.99)
WBC: 9.4 10*3/uL (ref 3.4–10.8)

## 2020-08-24 LAB — CERVICOVAGINAL ANCILLARY ONLY
Bacterial Vaginitis (gardnerella): NEGATIVE
Candida Glabrata: NEGATIVE
Candida Vaginitis: POSITIVE — AB
Chlamydia: NEGATIVE
Comment: NEGATIVE
Comment: NEGATIVE
Comment: NEGATIVE
Comment: NEGATIVE
Comment: NEGATIVE
Comment: NORMAL
Neisseria Gonorrhea: NEGATIVE
Trichomonas: NEGATIVE

## 2020-08-24 LAB — HCV INTERPRETATION

## 2020-08-25 LAB — AFP TETRA
DIA Mom Value: 1.04
DIA Value (EIA): 189.02 pg/mL
DSR (By Age)    1 IN: 1108
DSR (Second Trimester) 1 IN: 10000
Gestational Age: 15 WEEKS
MSAFP Mom: 0.8
MSAFP: 25.8 ng/mL
MSHCG Mom: 0.5
MSHCG: 29703 m[IU]/mL
Maternal Age At EDD: 22.8 yr
Osb Risk: 10000
T18 (By Age): 1:4318 {titer}
Test Results:: NEGATIVE
Weight: 136 [lb_av]
uE3 Mom: 0.72
uE3 Value: 0.57 ng/mL

## 2020-08-27 LAB — CULTURE, OB URINE

## 2020-08-27 LAB — URINE CULTURE, OB REFLEX

## 2020-08-31 ENCOUNTER — Encounter: Payer: Self-pay | Admitting: General Practice

## 2020-09-06 ENCOUNTER — Encounter: Payer: Self-pay | Admitting: General Practice

## 2020-09-14 ENCOUNTER — Other Ambulatory Visit: Payer: Self-pay

## 2020-09-14 ENCOUNTER — Encounter: Payer: Self-pay | Admitting: Certified Nurse Midwife

## 2020-09-14 ENCOUNTER — Ambulatory Visit (INDEPENDENT_AMBULATORY_CARE_PROVIDER_SITE_OTHER): Payer: Medicaid Other | Admitting: Certified Nurse Midwife

## 2020-09-14 VITALS — BP 99/62 | HR 92 | Wt 137.6 lb

## 2020-09-14 DIAGNOSIS — Z3480 Encounter for supervision of other normal pregnancy, unspecified trimester: Secondary | ICD-10-CM | POA: Diagnosis not present

## 2020-09-14 DIAGNOSIS — O09299 Supervision of pregnancy with other poor reproductive or obstetric history, unspecified trimester: Secondary | ICD-10-CM | POA: Insufficient documentation

## 2020-09-14 DIAGNOSIS — O219 Vomiting of pregnancy, unspecified: Secondary | ICD-10-CM

## 2020-09-14 DIAGNOSIS — Z3A18 18 weeks gestation of pregnancy: Secondary | ICD-10-CM

## 2020-09-14 MED ORDER — DICLEGIS 10-10 MG PO TBEC: 2.0000 | DELAYED_RELEASE_TABLET | Freq: Every day | ORAL | 2 refills | Status: DC

## 2020-09-14 NOTE — Progress Notes (Signed)
History:   Nicole Harris is a 24 y.o. G2P1001 at [redacted]w[redacted]d by early ultrasound being seen today for her first obstetrical visit.  Her obstetrical history is significant for hx of vacuum delivery with first child. Patient undecided on feeding method. Pregnancy history fully reviewed.  Patient reports no complaints. Patient is very anxious and nervous about pregnancy. Patient reports having a traumatic previous delivery where baby was not breathing due to cord wrapped around neck and use of vacuum at delivery that kept popping off.      HISTORY: OB History  Gravida Para Term Preterm AB Living  2 1 1  0 0 1  SAB TAB Ectopic Multiple Live Births  0 0 0 0 1    # Outcome Date GA Lbr Len/2nd Weight Sex Delivery Anes PTL Lv  2 Current           1 Term 02/02/19 [redacted]w[redacted]d 25:57 / 01:37 8 lb 12.9 oz (3.994 kg) M Vag-Vacuum EPI  LIV     Name: Mastandrea,BOY Kirby     Apgar1: 1  Apgar5: 9     Past Medical History:  Diagnosis Date  . ADHD (attention deficit hyperactivity disorder)   . Anemia   . Anxiety   . Bipolar disorder (HCC)   . Depression   . Encounter for supervision of normal first pregnancy in second trimester 07/10/2018   .09/10/2018 Nursing Staff Provider Office Location  [facility] Dating  U/S 08/14/18 Language   English Anatomy 08/16/18  08/14/18 Normal Flu Vaccine   09/25/18 Genetic Screen  NIPS:   AFP: Negative  First Screen:  Quad:   TDaP vaccine    Hgb A1C or  GTT Early  Third trimester  Rhogam     LAB RESULTS  Feeding Plan Breast Blood Type   O + Contraception  OCP Antibody  Negative Circumcision  Yes Rubella  Immune Pedia   Past Surgical History:  Procedure Laterality Date  . ADENOIDECTOMY    . TONGUE SURGERY    . TONSILLECTOMY     Family History  Problem Relation Age of Onset  . Diabetes Paternal Grandmother   . Drug abuse Mother   . Cancer Maternal Grandmother   . Thyroid disease Maternal Grandmother   . Ovarian cancer Maternal Grandmother   . Hypertension Paternal Grandfather     Social History   Tobacco Use  . Smoking status: Former Smoker    Types: Cigarettes  . Smokeless tobacco: Never Used  Vaping Use  . Vaping Use: Never used  Substance Use Topics  . Alcohol use: Not Currently    Comment: occasionally  . Drug use: No   Allergies  Allergen Reactions  . Latex Rash  . Shellfish Allergy Rash   Current Outpatient Medications on File Prior to Visit  Medication Sig Dispense Refill  . Blood Pressure Monitoring (BLOOD PRESSURE MONITOR AUTOMAT) DEVI 1 Device by Does not apply route daily. Automatic blood pressure cuff regular size. To monitor blood pressure regularly at home. ICD-10 code:Z34.90 1 each 0  . Misc. Devices (GOJJI WEIGHT SCALE) MISC 1 Device by Does not apply route daily as needed. To weight self daily as needed at home. ICD-10 code: Z34.90 1 each 0  . Prenatal Vit-Fe Phos-FA-Omega (VITAFOL GUMMIES) 3.33-0.333-34.8 MG CHEW Chew 3 Pieces by mouth daily. 90 tablet 12  . Cetirizine HCl 10 MG CAPS Take 1 capsule (10 mg total) by mouth daily for 10 days. 10 capsule 0  . fluticasone (FLONASE) 50 MCG/ACT nasal spray Place 1-2  sprays into both nostrils daily for 7 days. 1 g 0  . metroNIDAZOLE (FLAGYL) 500 MG tablet Take 1 tablet (500 mg total) by mouth 2 (two) times daily. 14 tablet 0  . [DISCONTINUED] ferrous sulfate 325 (65 FE) MG tablet Take 1 tablet (325 mg total) by mouth 2 (two) times daily with a meal. 60 tablet 0   No current facility-administered medications on file prior to visit.    Review of Systems Pertinent items noted in HPI and remainder of comprehensive ROS otherwise negative. Physical Exam:   Vitals:   09/14/20 1313  BP: 99/62  Pulse: 92  Weight: 137 lb 9.6 oz (62.4 kg)   Fetal Heart Rate (bpm): 147  System: General: well-developed, well-nourished female in no acute distress   Breasts:  normal appearance, no masses or tenderness bilaterally   Skin: normal coloration and turgor, no rashes   Neurologic: oriented, normal,  negative, normal mood   Extremities: normal strength, tone, and muscle mass, ROM of all joints is normal   HEENT PERRLA, extraocular movement intact and sclera clear   Mouth/Teeth mucous membranes moist, pharynx normal without lesions and dental hygiene good   Neck supple and no masses   Cardiovascular: regular rate and rhythm   Respiratory:  no respiratory distress, normal breath sounds   Abdomen: soft, non-tender; bowel sounds normal; no masses,  no organomegaly    Assessment:    Pregnancy: G2P1001 Patient Active Problem List   Diagnosis Date Noted  . Supervision of other normal pregnancy, antepartum 08/23/2020  . Indication for care in labor or delivery 02/01/2019  . GBS (group B Streptococcus carrier), +RV culture, currently pregnant 01/08/2019  . Anemia in pregnancy 11/04/2018  . Nausea/vomiting in pregnancy 07/10/2018     Plan:    1. Supervision of other normal pregnancy, antepartum - Welcomed back to practice and congratulated patient on pregnancy  - Reviewed safety, visitor policy, reassurance about COVID-19 for pregnancy at this time. Discussed possible changes to visits, including televisits, that may occur due to COVID-19.  The office remains open if pt needs to be seen and MAU is open 24 hours/day for OB emergencies. - patient request pap to be completed PP  - Routine prenatal care - Anticipatory guidance on upcoming appointments - Anatomy US to be scheduled  - [place] MFM OB COMP + 14 WK; Future  2. [redacted] weeks gestation of pregnancy - [place] MFM OB COMP + 14 WK; Future  3. Nausea and vomiting in pregnancy - patient reports nausea and vomiting multiple times throughout the night and sometimes during the day  - reports zofran did not work for her last pregnancy and request another medication  - educated and discussed phenergan, diclegis, reglan or compazine, decided on diclegis through shared decision making  - DICLEGIS 10-10 MG TBEC; Take 2 tablets by mouth at bedtime. Take 2  tablets at bedtime. If not helping can increase to 1 tablet in morning in addition to bedtime on day 3.  Dispense: 60 tablet; Refill: 2   Initial labs reviewed. Continue prenatal vitamins. Problem list reviewed and updated. Genetic Screening discussed, AFP and NIPS: results reviewed. Ultrasound discussed; fetal anatomic survey: ordered. Anticipatory guidance about prenatal visits given including labs, ultrasounds, and testing. Discussed usage of Babyscripts and virtual visits as additional source of managing and completing prenatal visits in midst of coronavirus and pandemic.   Encouraged to complete MyChart Registration for her ability to review results, send requests, and have questions addressed.  The nature of [facility] with multiple MDs and Advanced Practice Providers was explained to patient; also emphasized that residents, students are part of our team. Routine obstetric precautions reviewed. Encouraged to seek out care at office or emergency room [facility] MAU preferred) for urgent and/or emergent concerns. Return in about 4 weeks (around 10/12/2020) for ROB-mychart.     Sharyon Cable, CNM [facility] [employer], [facility]

## 2020-09-14 NOTE — Progress Notes (Signed)
Needs pap

## 2020-09-14 NOTE — Patient Instructions (Signed)
Second Trimester of Pregnancy  The second trimester is from week 14 through week 27 (month 4 through 6). This is often the time in pregnancy that you feel your best. Often times, morning sickness has lessened or quit. You may have more energy, and you may get hungry more often. Your unborn baby is growing rapidly. At the end of the sixth month, he or she is about 9 inches long and weighs about 1 pounds. You will likely feel the baby move between 18 and 20 weeks of pregnancy. Follow these instructions at home: Medicines  Take over-the-counter and prescription medicines only as told by your doctor. Some medicines are safe and some medicines are not safe during pregnancy.  Take a prenatal vitamin that contains at least 600 micrograms (mcg) of folic acid.  If you have trouble pooping (constipation), take medicine that will make your stool soft (stool softener) if your doctor approves. Eating and drinking   Eat regular, healthy meals.  Avoid raw meat and uncooked cheese.  If you get low calcium from the food you eat, talk to your doctor about taking a daily calcium supplement.  Avoid foods that are high in fat and sugars, such as fried and sweet foods.  If you feel sick to your stomach (nauseous) or throw up (vomit): ? Eat 4 or 5 small meals a day instead of 3 large meals. ? Try eating a few soda crackers. ? Drink liquids between meals instead of during meals.  To prevent constipation: ? Eat foods that are high in fiber, like fresh fruits and vegetables, whole grains, and beans. ? Drink enough fluids to keep your pee (urine) clear or pale yellow. Activity  Exercise only as told by your doctor. Stop exercising if you start to have cramps.  Do not exercise if it is too hot, too humid, or if you are in a place of great height (high altitude).  Avoid heavy lifting.  Wear low-heeled shoes. Sit and stand up straight.  You can continue to have sex unless your doctor tells you not  to. Relieving pain and discomfort  Wear a good support bra if your breasts are tender.  Take warm water baths (sitz baths) to soothe pain or discomfort caused by hemorrhoids. Use hemorrhoid cream if your doctor approves.  Rest with your legs raised if you have leg cramps or low back pain.  If you develop puffy, bulging veins (varicose veins) in your legs: ? Wear support hose or compression stockings as told by your doctor. ? Raise (elevate) your feet for 15 minutes, 3-4 times a day. ? Limit salt in your food. Prenatal care  Write down your questions. Take them to your prenatal visits.  Keep all your prenatal visits as told by your doctor. This is important. Safety  Wear your seat belt when driving.  Make a list of emergency phone numbers, including numbers for family, friends, the hospital, and police and fire departments. General instructions  Ask your doctor about the right foods to eat or for help finding a counselor, if you need these services.  Ask your doctor about local prenatal classes. Begin classes before month 6 of your pregnancy.  Do not use hot tubs, steam rooms, or saunas.  Do not douche or use tampons or scented sanitary pads.  Do not cross your legs for long periods of time.  Visit your dentist if you have not done so. Use a soft toothbrush to brush your teeth. Floss gently.  Avoid all smoking, herbs,   and alcohol. Avoid drugs that are not approved by your doctor.  Do not use any products that contain nicotine or tobacco, such as cigarettes and e-cigarettes. If you need help quitting, ask your doctor.  Avoid cat litter boxes and soil used by cats. These carry germs that can cause birth defects in the baby and can cause a loss of your baby (miscarriage) or stillbirth. Contact a doctor if:  You have mild cramps or pressure in your lower belly.  You have pain when you pee (urinate).  You have bad smelling fluid coming from your vagina.  You continue to  feel sick to your stomach (nauseous), throw up (vomit), or have watery poop (diarrhea).  You have a nagging pain in your belly area.  You feel dizzy. Get help right away if:  You have a fever.  You are leaking fluid from your vagina.  You have spotting or bleeding from your vagina.  You have severe belly cramping or pain.  You lose or gain weight rapidly.  You have trouble catching your breath and have chest pain.  You notice sudden or extreme puffiness (swelling) of your face, hands, ankles, feet, or legs.  You have not felt the baby move in over an hour.  You have severe headaches that do not go away when you take medicine.  You have trouble seeing. Summary  The second trimester is from week 14 through week 27 (months 4 through 6). This is often the time in pregnancy that you feel your best.  To take care of yourself and your unborn baby, you will need to eat healthy meals, take medicines only if your doctor tells you to do so, and do activities that are safe for you and your baby.  Call your doctor if you get sick or if you notice anything unusual about your pregnancy. Also, call your doctor if you need help with the right food to eat, or if you want to know what activities are safe for you. This information is not intended to replace advice given to you by your health care provider. Make sure you discuss any questions you have with your health care provider. Document Revised: 04/10/2019 Document Reviewed: 01/22/2017 Elsevier Patient Education  2020 [employer]vier Inc.  Safe Medications in Pregnancy   Acne: Benzoyl Peroxide Salicylic Acid  Backache/Headache: Tylenol: 2 regular strength every 4 hours OR              2 Extra strength every 6 hours  Colds/Coughs/Allergies: Benadryl (alcohol free) 25 mg every 6 hours as needed Breath right strips Claritin Cepacol throat lozenges Chloraseptic throat spray Cold-Eeze- up to three times per day Cough drops, alcohol  free Flonase (by prescription only) Guaifenesin Mucinex Robitussin DM (plain only, alcohol free) Saline nasal spray/drops Sudafed (pseudoephedrine) & Actifed ** use only after [redacted] weeks gestation and if you do not have high blood pressure Tylenol Vicks Vaporub Zinc lozenges Zyrtec   Constipation: Colace Ducolax suppositories Fleet enema Glycerin suppositories Metamucil Milk of magnesia Miralax Senokot Smooth move tea  Diarrhea: Kaopectate Imodium A-D  *NO pepto Bismol  Hemorrhoids: Anusol Anusol HC Preparation H Tucks  Indigestion: Tums Maalox Mylanta Zantac  Pepcid  Insomnia: Benadryl (alcohol free) 25mg every 6 hours as needed Tylenol PM Unisom, no Gelcaps  Leg Cramps: Tums MagGel  Nausea/Vomiting:  Bonine Dramamine Emetrol Ginger extract Sea bands Meclizine  Nausea medication to take during pregnancy:  Unisom (doxylamine succinate 25 mg tablets) Take one tablet daily at bedtime. If symptoms are   not adequately controlled, the dose can be increased to a maximum recommended dose of two tablets daily (1/2 tablet in the morning, 1/2 tablet mid-afternoon and one at bedtime). Vitamin B6 100mg tablets. Take one tablet twice a day (up to 200 mg per day).  Skin Rashes: Aveeno products Benadryl cream or 25mg every 6 hours as needed Calamine Lotion 1% cortisone cream  Yeast infection: Gyne-lotrimin 7 Monistat 7   **If taking multiple medications, please check labels to avoid duplicating the same active ingredients **take medication as directed on the label ** Do not exceed 4000 mg of tylenol in 24 hours **Do not take medications that contain aspirin or ibuprofen     

## 2020-09-15 ENCOUNTER — Encounter: Payer: Self-pay | Admitting: General Practice

## 2020-09-29 ENCOUNTER — Ambulatory Visit: Payer: Medicaid Other | Attending: Certified Nurse Midwife

## 2020-09-29 ENCOUNTER — Other Ambulatory Visit: Payer: Self-pay

## 2020-09-29 DIAGNOSIS — Z3480 Encounter for supervision of other normal pregnancy, unspecified trimester: Secondary | ICD-10-CM | POA: Insufficient documentation

## 2020-09-29 DIAGNOSIS — Z3A18 18 weeks gestation of pregnancy: Secondary | ICD-10-CM | POA: Insufficient documentation

## 2020-10-13 ENCOUNTER — Telehealth: Payer: Medicaid Other | Admitting: Obstetrics and Gynecology

## 2020-10-19 ENCOUNTER — Telehealth (INDEPENDENT_AMBULATORY_CARE_PROVIDER_SITE_OTHER): Payer: Medicaid Other | Admitting: Certified Nurse Midwife

## 2020-10-19 ENCOUNTER — Encounter: Payer: Self-pay | Admitting: Certified Nurse Midwife

## 2020-10-19 VITALS — BP 106/49 | HR 71 | Wt 144.6 lb

## 2020-10-19 DIAGNOSIS — Z8659 Personal history of other mental and behavioral disorders: Secondary | ICD-10-CM | POA: Insufficient documentation

## 2020-10-19 DIAGNOSIS — Z3A23 23 weeks gestation of pregnancy: Secondary | ICD-10-CM

## 2020-10-19 DIAGNOSIS — Z3480 Encounter for supervision of other normal pregnancy, unspecified trimester: Secondary | ICD-10-CM

## 2020-10-19 NOTE — Patient Instructions (Signed)
Glucose Tolerance Test During Pregnancy Why am I having this test? The glucose tolerance test (GTT) is done to check how your body processes sugar (glucose). This is one of several tests used to diagnose diabetes that develops during pregnancy (gestational diabetes mellitus). Gestational diabetes is a temporary form of diabetes that some women develop during pregnancy. It usually occurs during the second trimester of pregnancy and goes away after delivery. Testing (screening) for gestational diabetes usually occurs between 24 and 28 weeks of pregnancy. You may have the GTT test after having a 1-hour glucose screening test if the results from that test indicate that you may have gestational diabetes. You may also have this test if:  You have a history of gestational diabetes.  You have a history of giving birth to very large babies or have experienced repeated fetal loss (stillbirth).  You have signs and symptoms of diabetes, such as: ? Changes in your vision. ? Tingling or numbness in your hands or feet. ? Changes in hunger, thirst, and urination that are not otherwise explained by your pregnancy. What is being tested? This test measures the amount of glucose in your blood at different times during a period of 3 hours. This indicates how well your body is able to process glucose. What kind of sample is taken?  Blood samples are required for this test. They are usually collected by inserting a needle into a blood vessel. How do I prepare for this test?  For 3 days before your test, eat normally. Have plenty of carbohydrate-rich foods.  Follow instructions from your health care provider about: ? Eating or drinking restrictions on the day of the test. You may be asked to not eat or drink anything other than water (fast) starting 8-10 hours before the test. ? Changing or stopping your regular medicines. Some medicines may interfere with this test. Tell a health care provider about:  All  medicines you are taking, including vitamins, herbs, eye drops, creams, and over-the-counter medicines.  Any blood disorders you have.  Any surgeries you have had.  Any medical conditions you have. What happens during the test? First, your blood glucose will be measured. This is referred to as your fasting blood glucose, since you fasted before the test. Then, you will drink a glucose solution that contains a certain amount of glucose. Your blood glucose will be measured again 1, 2, and 3 hours after drinking the solution. This test takes about 3 hours to complete. You will need to stay at the testing location during this time. During the testing period:  Do not eat or drink anything other than the glucose solution.  Do not exercise.  Do not use any products that contain nicotine or tobacco, such as cigarettes and e-cigarettes. If you need help stopping, ask your health care provider. The testing procedure may vary among health care providers and hospitals. How are the results reported? Your results will be reported as milligrams of glucose per deciliter of blood (mg/dL) or millimoles per liter (mmol/L). Your health care provider will compare your results to normal ranges that were established after testing a large group of people (reference ranges). Reference ranges may vary among labs and hospitals. For this test, common reference ranges are:  Fasting: less than 95-105 mg/dL (5.3-5.8 mmol/L).  1 hour after drinking glucose: less than 180-190 mg/dL (10.0-10.5 mmol/L).  2 hours after drinking glucose: less than 155-165 mg/dL (8.6-9.2 mmol/L).  3 hours after drinking glucose: 140-145 mg/dL (7.8-8.1 mmol/L). What do the   results mean? Results within reference ranges are considered normal, meaning that your glucose levels are well-controlled. If two or more of your blood glucose levels are high, you may be diagnosed with gestational diabetes. If only one level is high, your health care  provider may suggest repeat testing or other tests to confirm a diagnosis. Talk with your health care provider about what your results mean. Questions to ask your health care provider Ask your health care provider, or the department that is doing the test:  When will my results be ready?  How will I get my results?  What are my treatment options?  What other tests do I need?  What are my next steps? Summary  The glucose tolerance test (GTT) is one of several tests used to diagnose diabetes that develops during pregnancy (gestational diabetes mellitus). Gestational diabetes is a temporary form of diabetes that some women develop during pregnancy.  You may have the GTT test after having a 1-hour glucose screening test if the results from that test indicate that you may have gestational diabetes. You may also have this test if you have any symptoms or risk factors for gestational diabetes.  Talk with your health care provider about what your results mean. This information is not intended to replace advice given to you by your health care provider. Make sure you discuss any questions you have with your health care provider. Document Revised: 04/09/2019 Document Reviewed: 07/29/2017 Elsevier Patient Education  2020 Elsevier Inc.  

## 2020-10-19 NOTE — Progress Notes (Signed)
OBSTETRICS PRENATAL VIRTUAL VISIT ENCOUNTER NOTE  Provider location: [facility]   I connected with Nicole Harris on 10/19/20 at  8:15 AM EDT by MyChart Video Encounter at home and verified that I am speaking with the correct person using two identifiers.   I discussed the limitations, risks, security and privacy concerns of performing an evaluation and management service virtually and the availability of in person appointments. I also discussed with the patient that there may be a patient responsible charge related to this service. The patient expressed understanding and agreed to proceed. Subjective:  Nicole Harris is a 24 y.o. G2P1001 at [redacted]w[redacted]d being seen today for ongoing prenatal care.  She is currently monitored for the following issues for this low-risk pregnancy and has Nausea/vomiting in pregnancy; Anemia in pregnancy; Supervision of other normal pregnancy, antepartum; and Hx of delivery by vacuum extraction, currently pregnant on their problem list.  Patient reports depression.  Contractions: Not present. Vag. Bleeding: None.  Movement: Present. Denies any leaking of fluid.   The following portions of the patient's history were reviewed and updated as appropriate: allergies, current medications, past family history, past medical history, past social history, past surgical history and problem list.   Objective:   Vitals:   10/19/20 0809  BP: (!) 106/49  Pulse: 71  Weight: 144 lb 9.6 oz (65.6 kg)    Fetal Status:     Movement: Present     General:  Alert, oriented and cooperative. Patient is in no acute distress.  Respiratory: Normal respiratory effort, no problems with respiration noted  Mental Status: Normal mood and affect. Normal behavior. Normal judgment and thought content.  Rest of physical exam deferred due to type of encounter  Imaging: [place] MFM OB COMP + 14 WK  Result Date:  09/29/2020 ----------------------------------------------------------------------  OBSTETRICS REPORT                       (Signed Final 09/29/2020 03:20 pm) ---------------------------------------------------------------------- Patient Info  ID #:       [id number]                          D.O.B.:  1996-03-20 (24 yrs)  Name:       Nicole Harris               Visit Date: 09/29/2020 01:50 pm ---------------------------------------------------------------------- Performed By  Attending:        Noralee Space MD        Referred By:      [facility]  Performed By:     Percell Boston          [facility] ---------------------------------------------------------------------- Orders  #  Description                           Code        Ordered By  1  [place] MFM OB COMP + 14 WK                [id number]    Mirl Hillery ----------------------------------------------------------------------  #  Order #                     Accession #                Episode #  1  [id number]                   [id number]                 [id number] ---------------------------------------------------------------------- Indications  [redacted] weeks gestation of pregnancy                Z3A.20  Encounter for antenatal screening for          Z36.3  malformations  Low risk NIPS, 12.0 FF, AFP neg, neg  Horizon ---------------------------------------------------------------------- Fetal Evaluation  Num Of Fetuses:         1  Fetal Heart Rate(bpm):  148  Cardiac Activity:       Observed  Presentation:           Breech  Placenta:               Anterior  P. Cord Insertion:      Visualized, central  Amniotic Fluid  AFI FV:      Within normal limits                              Largest Pocket(cm)                              4.76  ---------------------------------------------------------------------- Biometry  BPD:      49.9  mm     G. Age:  21w 1d         59  %    CI:        76.76   %    70 - 86                                                          FL/HC:      19.2   %    15.9 - 20.3  HC:      180.4  mm     G. Age:  20w 3d         24  %    HC/AC:      1.12        1.06 - 1.25  AC:       161   mm     G. Age:  21w 1d         55  %    FL/BPD:     69.3   %  FL:       34.6  mm     G. Age:  20w  6d         42  %    FL/AC:      21.5   %    20 - 24  HUM:      33.6  mm     G. Age:  21w 3d         62  %  CER:      20.4  mm     G. Age:  19w 4d         20  %  LV:        6.5  mm  CM:        6.1  mm  Est. FW:     392  gm    0 lb 14 oz      53  % ---------------------------------------------------------------------- OB History  Gravidity:    2         Term:   1        Prem:   0        SAB:   0  TOP:          0       Ectopic:  0        Living: 1 ---------------------------------------------------------------------- Gestational Age  LMP:           22w 0d        Date:  04/28/20                 EDD:   02/02/21  U/S Today:     20w 6d                                        EDD:   02/10/21  Best:          Cherylann Parr20w 6d     Det. By:  Marcella DubsEarly Ultrasound         EDD:   02/10/21                                      (06/21/20) ---------------------------------------------------------------------- Anatomy  Cranium:               Appears normal         LVOT:                   Appears normal  Cavum:                 Appears normal         Aortic Arch:            Appears normal  Ventricles:            Appears normal         Ductal Arch:            Appears normal  Choroid Plexus:        Appears normal         Diaphragm:              Appears normal  Cerebellum:            Appears normal         Stomach:                Appears normal, left  sided  Posterior Fossa:       Appears normal         Abdomen:                 Appears normal  Nuchal Fold:           Appears normal         Abdominal Wall:         Appears nml (cord                                                                        insert, abd wall)  Face:                  Appears normal         Cord Vessels:           Appears normal (3                         (orbits and profile)                           vessel cord)  Lips:                  Appears normal         Kidneys:                Appear normal  Palate:                Appears normal         Bladder:                Appears normal  Thoracic:              Appears normal         Spine:                  Appears normal  Heart:                 Appears normal         Upper Extremities:      Appears normal                         (4CH, axis, and                         situs)  RVOT:                  Appears normal         Lower Extremities:      Appears normal  Other:  Fetus appears to be female. Heels and 5th digit visualized.          Technically difficult due to fetal position. ---------------------------------------------------------------------- Cervix Uterus Adnexa  Cervix  Length:            3.9  cm.  Closed . Normal appearance by transabdominal scan.  Uterus  No abnormality visualized.  Right Ovary  No adnexal mass visualized.  Left Ovary  No adnexal mass visualized.  Cul De Sac  No free fluid  seen.  Adnexa  No abnormality visualized. ---------------------------------------------------------------------- Impression  G2 P1. Patient is here for fetal anatomy scan.  MSAFP screening showed low risk for open-neural tube  defects .  On cell-free fetal DNA screening, the risks of fetal  aneuploidies are not increased .  We performed fetal anatomy scan. No makers of  aneuploidies or fetal structural defects are seen. Fetal  biometry is consistent with her previously-established dates.  Amniotic fluid is normal and good fetal activity is seen.  Patient understands the limitations of ultrasound in detecting  fetal  anomalies. ---------------------------------------------------------------------- Recommendations  Follow-up scans as clinically indicated. ----------------------------------------------------------------------                  Noralee Space, MD Electronically Signed Final Report   09/29/2020 03:20 pm ----------------------------------------------------------------------   Assessment and Plan:  Pregnancy: G2P1001 at [redacted]w[redacted]d 1. Supervision of other normal pregnancy, antepartum - Routine prenatal care - Patient reports last GTT she passed out at the completion of the second blood draw, discussed with patient that we can do 1 hr GTT in order to prevent syncope episode.  - Educated and discussed that if patient fails 1hr GTT then will be diagnosed with GDM or have the option of completing 2hr GTT, patient verbalizes understanding  - Anticipatory guidance on upcoming appointments  2. History of depression - Patient reports hx of depression, not currently on medication  - patient reports that depression is starting to effect her and is getting worse  - educated and discussed possible initiation of medication after appointment with Sue Lush, patient verbalizes understanding and agrees with plan of care.  - Ambulatory referral to [facility]  3. [redacted] weeks gestation of pregnancy - reviewed anatomy ultrasound with patient - normal   Preterm labor symptoms and general obstetric precautions including but not limited to vaginal bleeding, contractions, leaking of fluid and fetal movement were reviewed in detail with the patient. I discussed the assessment and treatment plan with the patient. The patient was provided an opportunity to ask questions and all were answered. The patient agreed with the plan and demonstrated an understanding of the instructions. The patient was advised to call back or seek an in-person office evaluation/go to [facility] at [facility] for any urgent or  concerning symptoms. Please refer to After Visit Summary for other counseling recommendations.   I provided 12 minutes of face-to-face time during this encounter.  Return in about 4 weeks (around 11/16/2020) for ROB/GTT.  Sharyon Cable, CNM [facility] [employer], [facility]

## 2020-10-20 ENCOUNTER — Ambulatory Visit (INDEPENDENT_AMBULATORY_CARE_PROVIDER_SITE_OTHER): Payer: Medicaid Other | Admitting: Licensed Clinical Social Worker

## 2020-10-20 DIAGNOSIS — Z8659 Personal history of other mental and behavioral disorders: Secondary | ICD-10-CM | POA: Diagnosis not present

## 2020-10-25 NOTE — BH Specialist Note (Signed)
Integrated Behavioral Health Initial Visit  MRN: [id number] Name: Nicole Harris  Number of Integrated Behavioral Health Clinician visits:: 1 Session Start time:10:15am  Session End time:10:35pm Total time: 20 mins via mychart   Type of Service: Integrated Behavioral Health- Individual Interpretor:no  Interpretor Name and Language: none   Warm Hand Off Completed.       SUBJECTIVE: Nicole Harris is a 24 y.o. female accompanied by n/a Patient was referred by V. Aundria Rud CNM  for history of depression. Patient reports the following symptoms/concerns: isolation, crying, fatigue, feelings of guilt and overwhelm Duration of problem: approx 3-4 weeks ; Severity of problem: mild   OBJECTIVE: Mood: good  and Affect: Normal  Risk of harm to self or others: No risk of harm to self or others.   LIFE CONTEXT: Family and Social: Lives with boyfriend and son  School/Work: [employer]  Self-Care: n/a  Life Changes: Newborn   GOALS ADDRESSED: Patient will: 1. Reduce symptoms of: postpartum depression  2. Increase knowledge of diagnosis and implement coping skills to alleviate symptoms  3. Demonstrate ability to express understanding of psychoeducation related to postpartum depression and self manage symptoms   INTERVENTIONS: Interventions utilized: supportive counseling   Standardized Assessments completed:    Initial Prenatal from 09/14/2020 in [facility]  PHQ-9 Total Score 4      ASSESSMENT: Patient currently experiencing postpartum depression    Patient may benefit from integrated behavioral health   PLAN: 1. Follow up with behavioral health clinician on : 11/16/2020  2. Behavioral recommendations: prioritize sleep, prenatal yoga and light physical activity to boost mood, incorporate mindfulness techniques to alleviate depressed mood  3. Referral(s): n/a 4. "From scale of 1-10, how likely are you to follow plan?":   Nicole Saxon,  LCSW

## 2020-11-16 ENCOUNTER — Other Ambulatory Visit: Payer: Self-pay

## 2020-11-16 ENCOUNTER — Ambulatory Visit (INDEPENDENT_AMBULATORY_CARE_PROVIDER_SITE_OTHER): Payer: Medicaid Other | Admitting: Certified Nurse Midwife

## 2020-11-16 ENCOUNTER — Encounter: Payer: Self-pay | Admitting: Certified Nurse Midwife

## 2020-11-16 VITALS — BP 100/64 | HR 94 | Temp 97.4°F | Wt 151.0 lb

## 2020-11-16 DIAGNOSIS — Z23 Encounter for immunization: Secondary | ICD-10-CM | POA: Diagnosis not present

## 2020-11-16 DIAGNOSIS — Z3480 Encounter for supervision of other normal pregnancy, unspecified trimester: Secondary | ICD-10-CM

## 2020-11-16 DIAGNOSIS — Z3482 Encounter for supervision of other normal pregnancy, second trimester: Secondary | ICD-10-CM | POA: Diagnosis not present

## 2020-11-16 DIAGNOSIS — Z3A27 27 weeks gestation of pregnancy: Secondary | ICD-10-CM | POA: Diagnosis not present

## 2020-11-16 MED ORDER — COMFORT FIT MATERNITY SUPP SM MISC: 1.0000 [IU] | Freq: Every day | 0 refills | Status: DC | PRN

## 2020-11-16 NOTE — Progress Notes (Addendum)
   PRENATAL VISIT NOTE  Subjective:  Nicole Harris is a 24 y.o. G2P1001 at [redacted]w[redacted]d being seen today for ongoing prenatal care.  She is currently monitored for the following issues for this low-risk pregnancy and has Anemia in pregnancy; Supervision of other normal pregnancy, antepartum; Hx of delivery by vacuum extraction, currently pregnant; and History of depression on their problem list.  Patient reports no complaints.  Contractions: Not present. Vag. Bleeding: None.  Movement: Present. Denies leaking of fluid.   The following portions of the patient's history were reviewed and updated as appropriate: allergies, current medications, past family history, past medical history, past social history, past surgical history and problem list.   Objective:   Vitals:   11/16/20 0846  BP: 100/64  Pulse: 94  Temp: (!) 97.4 F (36.3 C)  Weight: 151 lb (68.5 kg)    Fetal Status: Fetal Heart Rate (bpm): 138 Fundal Height: 27 cm Movement: Present     General:  Alert, oriented and cooperative. Patient is in no acute distress.  Skin: Skin is warm and dry. No rash noted.   Cardiovascular: Normal heart rate noted  Respiratory: Normal respiratory effort, no problems with respiration noted  Abdomen: Soft, gravid, appropriate for gestational age.  Pain/Pressure: Absent     Pelvic: Cervical exam deferred        Extremities: Normal range of motion.  Edema: Trace  Mental Status: Normal mood and affect. Normal behavior. Normal judgment and thought content.   Assessment and Plan:  Pregnancy: G2P1001 at [redacted]w[redacted]d 1. Encounter for supervision of other normal pregnancy in second trimester - Pt doing well, all n/v has eased - Followed up after [facility] appointment, pt doing well with calming/mindfulness techniques, does not feel she needs antidepressants at this time. Discussed additional ways to support her body/mind to ease transient moments of sadness, also brainstormed ways FOB can support her. He verbalized  understanding and commitment to helping her.  2. [redacted] weeks gestation of pregnancy - HIV Antibody (routine testing w rflx) - RPR - CBC - Glucose, 1 hour gestational - Elastic Bandages & Supports (COMFORT FIT MATERNITY SUPP SM) MISC; 1 Units by Does not apply route daily as needed.  Dispense: 1 each; Refill: 0  3. Need for tetanus, diphtheria, and acellular pertussis (Tdap) vaccine in patient of adolescent age or older - Tdap vaccine greater than or equal to 7yo IM  4. Need for immunization against influenza - Flu Vaccine QUAD 36+ mos IM  Preterm labor symptoms and general obstetric precautions including but not limited to vaginal bleeding, contractions, leaking of fluid and fetal movement were reviewed in detail with the patient. Please refer to After Visit Summary for other counseling recommendations.   Return in about 2 weeks (around 11/30/2020) for VIRTUAL, LOB.  Future Appointments  Date Time Provider Department Center  11/30/2020  9:10 AM Armando Reichert, CNM [facility] None    Bernerd Limbo, CNM

## 2020-11-16 NOTE — Patient Instructions (Signed)
Third Trimester of Pregnancy  The third trimester is from week 28 through week 40 (months 7 through 9). This trimester is when your unborn baby (fetus) is growing very fast. At the end of the ninth month, the unborn baby is about 20 inches in length. It weighs about 6-10 pounds. Follow these instructions at home: Medicines  Take over-the-counter and prescription medicines only as told by your doctor. Some medicines are safe and some medicines are not safe during pregnancy.  Take a prenatal vitamin that contains at least 600 micrograms (mcg) of folic acid.  If you have trouble pooping (constipation), take medicine that will make your stool soft (stool softener) if your doctor approves. Eating and drinking   Eat regular, healthy meals.  Avoid raw meat and uncooked cheese.  If you get low calcium from the food you eat, talk to your doctor about taking a daily calcium supplement.  Eat four or five small meals rather than three large meals a day.  Avoid foods that are high in fat and sugars, such as fried and sweet foods.  To prevent constipation: ? Eat foods that are high in fiber, like fresh fruits and vegetables, whole grains, and beans. ? Drink enough fluids to keep your pee (urine) clear or pale yellow. Activity  Exercise only as told by your doctor. Stop exercising if you start to have cramps.  Avoid heavy lifting, wear low heels, and sit up straight.  Do not exercise if it is too hot, too humid, or if you are in a place of great height (high altitude).  You may continue to have sex unless your doctor tells you not to. Relieving pain and discomfort  Wear a good support bra if your breasts are tender.  Take frequent breaks and rest with your legs raised if you have leg cramps or low back pain.  Take warm water baths (sitz baths) to soothe pain or discomfort caused by hemorrhoids. Use hemorrhoid cream if your doctor approves.  If you develop puffy, bulging veins (varicose  veins) in your legs: ? Wear support hose or compression stockings as told by your doctor. ? Raise (elevate) your feet for 15 minutes, 3-4 times a day. ? Limit salt in your food. Safety  Wear your seat belt when driving.  Make a list of emergency phone numbers, including numbers for family, friends, the hospital, and police and fire departments. Preparing for your baby's arrival To prepare for the arrival of your baby:  Take prenatal classes.  Practice driving to the hospital.  Visit the hospital and tour the maternity area.  Talk to your work about taking leave once the baby comes.  Pack your hospital bag.  Prepare the baby's room.  Go to your doctor visits.  Buy a rear-facing car seat. Learn how to install it in your car. General instructions  Do not use hot tubs, steam rooms, or saunas.  Do not use any products that contain nicotine or tobacco, such as cigarettes and e-cigarettes. If you need help quitting, ask your doctor.  Do not drink alcohol.  Do not douche or use tampons or scented sanitary pads.  Do not cross your legs for long periods of time.  Do not travel for long distances unless you must. Only do so if your doctor says it is okay.  Visit your dentist if you have not gone during your pregnancy. Use a soft toothbrush to brush your teeth. Be gentle when you floss.  Avoid cat litter boxes and soil   used by cats. These carry germs that can cause birth defects in the baby and can cause a loss of your baby (miscarriage) or stillbirth.  Keep all your prenatal visits as told by your doctor. This is important. Contact a doctor if:  You are not sure if you are in labor or if your water has broken.  You are dizzy.  You have mild cramps or pressure in your lower belly.  You have a nagging pain in your belly area.  You continue to feel sick to your stomach, you throw up, or you have watery poop.  You have bad smelling fluid coming from your vagina.  You have  pain when you pee. Get help right away if:  You have a fever.  You are leaking fluid from your vagina.  You are spotting or bleeding from your vagina.  You have severe belly cramps or pain.  You lose or gain weight quickly.  You have trouble catching your breath and have chest pain.  You notice sudden or extreme puffiness (swelling) of your face, hands, ankles, feet, or legs.  You have not felt the baby move in over an hour.  You have severe headaches that do not go away with medicine.  You have trouble seeing.  You are leaking, or you are having a gush of fluid, from your vagina before you are 37 weeks.  You have regular belly spasms (contractions) before you are 37 weeks. Summary  The third trimester is from week 28 through week 40 (months 7 through 9). This time is when your unborn baby is growing very fast.  Follow your doctor's advice about medicine, food, and activity.  Get ready for the arrival of your baby by taking prenatal classes, getting all the baby items ready, preparing the baby's room, and visiting your doctor to be checked.  Get help right away if you are bleeding from your vagina, or you have chest pain and trouble catching your breath, or if you have not felt your baby move in over an hour. This information is not intended to replace advice given to you by your health care provider. Make sure you discuss any questions you have with your health care provider. Document Revised: 04/09/2019 Document Reviewed: 01/22/2017 Elsevier Patient Education  2020 [employer] Glucose Tolerance Test During Pregnancy Why am I having this test? The glucose tolerance test (GTT) is done to check how your body processes sugar (glucose). This is one of several tests used to diagnose diabetes that develops during pregnancy (gestational diabetes mellitus). Gestational diabetes is a temporary form of diabetes that some women develop during pregnancy. It usually occurs during the  second trimester of pregnancy and goes away after delivery. Testing (screening) for gestational diabetes usually occurs between 24 and 28 weeks of pregnancy. You may have the GTT test after having a 1-hour glucose screening test if the results from that test indicate that you may have gestational diabetes. You may also have this test if:  You have a history of gestational diabetes.  You have a history of giving birth to very large babies or have experienced repeated fetal loss (stillbirth).  You have signs and symptoms of diabetes, such as: ? Changes in your vision. ? Tingling or numbness in your hands or feet. ? Changes in hunger, thirst, and urination that are not otherwise explained by your pregnancy. What is being tested? This test measures the amount of glucose in your blood at different times during a period of 3   hours. This indicates how well your body is able to process glucose. What kind of sample is taken?  Blood samples are required for this test. They are usually collected by inserting a needle into a blood vessel. How do I prepare for this test?  For 3 days before your test, eat normally. Have plenty of carbohydrate-rich foods.  Follow instructions from your health care provider about: ? Eating or drinking restrictions on the day of the test. You may be asked to not eat or drink anything other than water (fast) starting 8-10 hours before the test. ? Changing or stopping your regular medicines. Some medicines may interfere with this test. Tell a health care provider about:  All medicines you are taking, including vitamins, herbs, eye drops, creams, and over-the-counter medicines.  Any blood disorders you have.  Any surgeries you have had.  Any medical conditions you have. What happens during the test? First, your blood glucose will be measured. This is referred to as your fasting blood glucose, since you fasted before the test. Then, you will drink a glucose solution that  contains a certain amount of glucose. Your blood glucose will be measured again 1, 2, and 3 hours after drinking the solution. This test takes about 3 hours to complete. You will need to stay at the testing location during this time. During the testing period:  Do not eat or drink anything other than the glucose solution.  Do not exercise.  Do not use any products that contain nicotine or tobacco, such as cigarettes and e-cigarettes. If you need help stopping, ask your health care provider. The testing procedure may vary among health care providers and hospitals. How are the results reported? Your results will be reported as milligrams of glucose per deciliter of blood (mg/dL) or millimoles per liter (mmol/L). Your health care provider will compare your results to normal ranges that were established after testing a large group of people (reference ranges). Reference ranges may vary among labs and hospitals. For this test, common reference ranges are:  Fasting: less than 95-105 mg/dL (5.3-5.8 mmol/L).  1 hour after drinking glucose: less than 180-190 mg/dL (10.0-10.5 mmol/L).  2 hours after drinking glucose: less than 155-165 mg/dL (8.6-9.2 mmol/L).  3 hours after drinking glucose: 140-145 mg/dL (7.8-8.1 mmol/L). What do the results mean? Results within reference ranges are considered normal, meaning that your glucose levels are well-controlled. If two or more of your blood glucose levels are high, you may be diagnosed with gestational diabetes. If only one level is high, your health care provider may suggest repeat testing or other tests to confirm a diagnosis. Talk with your health care provider about what your results mean. Questions to ask your health care provider Ask your health care provider, or the department that is doing the test:  When will my results be ready?  How will I get my results?  What are my treatment options?  What other tests do I need?  What are my next  steps? Summary  The glucose tolerance test (GTT) is one of several tests used to diagnose diabetes that develops during pregnancy (gestational diabetes mellitus). Gestational diabetes is a temporary form of diabetes that some women develop during pregnancy.  You may have the GTT test after having a 1-hour glucose screening test if the results from that test indicate that you may have gestational diabetes. You may also have this test if you have any symptoms or risk factors for gestational diabetes.  Talk with your health   care provider about what your results mean. This information is not intended to replace advice given to you by your health care provider. Make sure you discuss any questions you have with your health care provider. Document Revised: 04/09/2019 Document Reviewed: 07/29/2017 Elsevier Patient Education  2020 Elsevier Inc.  

## 2020-11-17 LAB — CBC
Hematocrit: 32 % — ABNORMAL LOW (ref 34.0–46.6)
Hemoglobin: 10.7 g/dL — ABNORMAL LOW (ref 11.1–15.9)
MCH: 29.8 pg (ref 26.6–33.0)
MCHC: 33.4 g/dL (ref 31.5–35.7)
MCV: 89 fL (ref 79–97)
Platelets: 262 10*3/uL (ref 150–450)
RBC: 3.59 x10E6/uL — ABNORMAL LOW (ref 3.77–5.28)
RDW: 11.4 % — ABNORMAL LOW (ref 11.7–15.4)
WBC: 7.7 10*3/uL (ref 3.4–10.8)

## 2020-11-17 LAB — GLUCOSE, 1 HOUR GESTATIONAL: Gestational Diabetes Screen: 108 mg/dL (ref 65–139)

## 2020-11-17 LAB — RPR: RPR Ser Ql: NONREACTIVE

## 2020-11-17 LAB — HIV ANTIBODY (ROUTINE TESTING W REFLEX): HIV Screen 4th Generation wRfx: NONREACTIVE

## 2020-11-23 ENCOUNTER — Telehealth: Payer: Self-pay | Admitting: *Deleted

## 2020-11-23 NOTE — Telephone Encounter (Signed)
Patient called stating she is having hip pain and a lot of pressure on bladder. Advised patient to take Tylenol extra strenght and apply heat. Advised to schedule an appointment for UTI check. Clinic is closed for Thanksgiving Holiday 11/24/20 and 11/25/20. Patient could be seen on Monday in nurse clinic or go to urgent care. Patient stated she will go to urgent care because she has to work on Monday. If measures does not work, patient to contact clinic. Clovis Pu, RN

## 2020-11-30 ENCOUNTER — Encounter: Payer: Self-pay | Admitting: Advanced Practice Midwife

## 2020-11-30 ENCOUNTER — Telehealth (INDEPENDENT_AMBULATORY_CARE_PROVIDER_SITE_OTHER): Payer: Medicaid Other | Admitting: Advanced Practice Midwife

## 2020-11-30 VITALS — BP 120/62 | HR 78 | Wt 151.2 lb

## 2020-11-30 DIAGNOSIS — D649 Anemia, unspecified: Secondary | ICD-10-CM

## 2020-11-30 DIAGNOSIS — Z3480 Encounter for supervision of other normal pregnancy, unspecified trimester: Secondary | ICD-10-CM

## 2020-11-30 DIAGNOSIS — Z8759 Personal history of other complications of pregnancy, childbirth and the puerperium: Secondary | ICD-10-CM

## 2020-11-30 DIAGNOSIS — Z3A29 29 weeks gestation of pregnancy: Secondary | ICD-10-CM

## 2020-11-30 DIAGNOSIS — O99013 Anemia complicating pregnancy, third trimester: Secondary | ICD-10-CM

## 2020-11-30 DIAGNOSIS — O09293 Supervision of pregnancy with other poor reproductive or obstetric history, third trimester: Secondary | ICD-10-CM

## 2020-11-30 DIAGNOSIS — O09299 Supervision of pregnancy with other poor reproductive or obstetric history, unspecified trimester: Secondary | ICD-10-CM

## 2020-11-30 MED ORDER — FERROUS SULFATE 324 (65 FE) MG PO TBEC: 1.0000 | DELAYED_RELEASE_TABLET | Freq: Every day | ORAL | 11 refills | Status: DC

## 2020-11-30 MED ORDER — FERROUS SULFATE 325 (65 FE) MG PO TABS: 325.0000 mg | ORAL_TABLET | Freq: Every day | ORAL | 11 refills | Status: DC

## 2020-11-30 NOTE — Progress Notes (Signed)
   TELEHEALTH OBSTETRICS VISIT ENCOUNTER NOTE  I connected with Nicole Harris on 11/30/20 at  9:10 AM EST by Mychart video at home and verified that I am speaking with the correct person using two identifiers.  Patient was at home and provider was at the office.    I discussed the limitations, risks, security and privacy concerns of performing an evaluation and management service by telephone and the availability of in person appointments. I also discussed with the patient that there may be a patient responsible charge related to this service. The patient expressed understanding and agreed to proceed.  Subjective:  Nicole Harris is a 24 y.o. G2P1001 at [redacted]w[redacted]d being followed for ongoing prenatal care.  She is currently monitored for the following issues for this low-risk pregnancy and has Anemia in pregnancy; Supervision of other normal pregnancy, antepartum; Hx of delivery by vacuum extraction, currently pregnant; and History of depression on their problem list.  Patient reports no complaints. Reports fetal movement. Denies any contractions, bleeding or leaking of fluid.   The following portions of the patient's history were reviewed and updated as appropriate: allergies, current medications, past family history, past medical history, past social history, past surgical history and problem list.   Objective:   General:  Alert, oriented and cooperative.   Mental Status: Normal mood and affect perceived. Normal judgment and thought content.  Rest of physical exam deferred due to type of encounter  Assessment and Plan:  Pregnancy: G2P1001 at [redacted]w[redacted]d 1. Hx of delivery by vacuum extraction, currently pregnant  2. Supervision of other normal pregnancy, antepartum - anemia on last check - Patient reports that she had severe anemia with her last pregnancy and required several iron infusions - Will start oral iron now and consider rechecking around 35/36 weeks   3. [redacted] weeks gestation of  pregnancy - routine care - 1 hour GTT was normal   Preterm labor symptoms and general obstetric precautions including but not limited to vaginal bleeding, contractions, leaking of fluid and fetal movement were reviewed in detail with the patient.  I discussed the assessment and treatment plan with the patient. The patient was provided an opportunity to ask questions and all were answered. The patient agreed with the plan and demonstrated an understanding of the instructions. The patient was advised to call back or seek an in-person office evaluation/go to [facility] at [facility] for any urgent or concerning symptoms. Please refer to After Visit Summary for other counseling recommendations.   I provided 12 minutes of non-face-to-face time during this encounter.  Return in about 2 weeks (around 12/14/2020).  No future appointments.  Thressa Sheller DNP, CNM  11/30/20  9:40 AM

## 2020-11-30 NOTE — Addendum Note (Signed)
Addended by: Thressa Sheller D on: 11/30/2020 11:03 AM   Modules accepted: Orders

## 2020-12-07 ENCOUNTER — Encounter (HOSPITAL_COMMUNITY): Payer: Self-pay | Admitting: Obstetrics and Gynecology

## 2020-12-07 ENCOUNTER — Inpatient Hospital Stay (HOSPITAL_COMMUNITY)
Admission: AD | Admit: 2020-12-07 | Discharge: 2020-12-07 | Disposition: A | Discharge status: Home / Self Care | Payer: Medicaid Other | Attending: Obstetrics and Gynecology | Admitting: Obstetrics and Gynecology

## 2020-12-07 ENCOUNTER — Other Ambulatory Visit: Payer: Self-pay

## 2020-12-07 DIAGNOSIS — Z3689 Encounter for other specified antenatal screening: Secondary | ICD-10-CM

## 2020-12-07 DIAGNOSIS — O26893 Other specified pregnancy related conditions, third trimester: Secondary | ICD-10-CM | POA: Insufficient documentation

## 2020-12-07 DIAGNOSIS — M25551 Pain in right hip: Secondary | ICD-10-CM | POA: Insufficient documentation

## 2020-12-07 DIAGNOSIS — Z87891 Personal history of nicotine dependence: Secondary | ICD-10-CM | POA: Diagnosis not present

## 2020-12-07 DIAGNOSIS — Z3A30 30 weeks gestation of pregnancy: Secondary | ICD-10-CM | POA: Diagnosis not present

## 2020-12-07 DIAGNOSIS — R102 Pelvic and perineal pain: Secondary | ICD-10-CM | POA: Insufficient documentation

## 2020-12-07 DIAGNOSIS — O99891 Other specified diseases and conditions complicating pregnancy: Secondary | ICD-10-CM | POA: Diagnosis not present

## 2020-12-07 DIAGNOSIS — M5441 Lumbago with sciatica, right side: Secondary | ICD-10-CM | POA: Diagnosis not present

## 2020-12-07 LAB — URINALYSIS, ROUTINE W REFLEX MICROSCOPIC
Bilirubin Urine: NEGATIVE
Glucose, UA: NEGATIVE mg/dL
Hgb urine dipstick: NEGATIVE
Ketones, ur: NEGATIVE mg/dL
Nitrite: NEGATIVE
Protein, ur: 30 mg/dL — AB
Specific Gravity, Urine: 1.026 (ref 1.005–1.030)
pH: 7 (ref 5.0–8.0)

## 2020-12-07 MED ORDER — CYCLOBENZAPRINE HCL 5 MG PO TABS
10.0000 mg | ORAL_TABLET | Freq: Once | ORAL | Status: AC
  Administered 2020-12-07: 10 mg via ORAL
  Filled 2020-12-07: qty 2

## 2020-12-07 MED ORDER — CYCLOBENZAPRINE HCL 10 MG PO TABS: 10.0000 mg | ORAL_TABLET | Freq: Two times a day (BID) | ORAL | 0 refills | Status: DC | PRN

## 2020-12-07 NOTE — Progress Notes (Signed)
Pt reports having hip pain and right sided pain for the past week that has been coming and going on exertion. Last night pain got worse and patient states its painful to walk and painful at rest. Rates pain 10/10. Denies vaginal bleeding, LOF, abd non tender, and reports +FM. Denies any falls, but reports on feet all day for work.

## 2020-12-07 NOTE — Discharge Instructions (Signed)

## 2020-12-07 NOTE — MAU Provider Note (Signed)
History     CSN: [id number]  Arrival date and time: 12/07/20 1427   First Provider Initiated Contact with Patient 12/07/20 1552      Chief Complaint  Patient presents with  . Pelvic Pain   Nicole Harris is a 24 y.o. G2P1001 at [redacted]w[redacted]d who receives care at [facility].  She presents today for Hip Pain.  Patient states she started having intermittent pain in her right hip when walking.  She described the pain as sharp and contributes it to "walking wrong." Patient states now "it's just pain."  She reports the pain is constant now and is primarily in her hip. Her FOB, Ashaun, reports patient also c/o some burning.  Patient rates the pain a 5/10 when not moving, but a 10/10 with movement.  Patient endorses fetal movement and denies vaginal discharge, bleeding, and leaking.  Patient endorses some occasional abdominal cramping, "but it is usually just low and I figure she is just working her way down."  Patient also endorses some BH contractions.    OB History    Gravida  2   Para  1   Term  1   Preterm      AB      Living  1     SAB      TAB      Ectopic      Multiple  0   Live Births  1           Past Medical History:  Diagnosis Date  . ADHD (attention deficit hyperactivity disorder)   . Anemia   . Anxiety   . Bipolar disorder (HCC)   . Depression   . Encounter for supervision of normal first pregnancy in second trimester 07/10/2018   .Marland Kitchen Nursing Staff Provider Office Location  [place] Dating  U/S 08/14/18 Language   English Anatomy US  08/14/18 Normal Flu Vaccine   09/25/18 Genetic Screen  NIPS:   AFP: Negative  First Screen:  Quad:   TDaP vaccine    Hgb A1C or  GTT Early  Third trimester  Rhogam     LAB RESULTS  Feeding Plan Breast Blood Type   O + Contraception  OCP Antibody  Negative Circumcision  Yes Rubella  Immune Pedia  . GBS (group B Streptococcus carrier), +RV culture, currently pregnant 01/08/2019  . Indication for care in labor or delivery 02/01/2019   . Nausea/vomiting in pregnancy 07/10/2018    Past Surgical History:  Procedure Laterality Date  . ADENOIDECTOMY    . TONGUE SURGERY    . TONSILLECTOMY      Family History  Problem Relation Age of Onset  . Diabetes Paternal Grandmother   . Drug abuse Mother   . Cancer Maternal Grandmother   . Thyroid disease Maternal Grandmother   . Ovarian cancer Maternal Grandmother   . Hypertension Paternal Grandfather     Social History   Tobacco Use  . Smoking status: Former Smoker    Types: Cigarettes  . Smokeless tobacco: Never Used  Vaping Use  . Vaping Use: Never used  Substance Use Topics  . Alcohol use: Not Currently    Comment: occasionally  . Drug use: No    Allergies:  Allergies  Allergen Reactions  . Latex Rash  . Shellfish Allergy Rash    Medications Prior to Admission  Medication Sig Dispense Refill Last Dose  . ferrous sulfate 325 (65 FE) MG tablet Take 1 tablet (325 mg total) by mouth daily with breakfast. 30  tablet 11 12/06/2020 at Unknown time  . Prenatal Vit-Fe Phos-FA-Omega (VITAFOL GUMMIES) 3.33-0.333-34.8 MG CHEW Chew 3 Pieces by mouth daily. 90 tablet 12 12/06/2020 at Unknown time  . Blood Pressure Monitoring (BLOOD PRESSURE MONITOR AUTOMAT) DEVI 1 Device by Does not apply route daily. Automatic blood pressure cuff regular size. To monitor blood pressure regularly at home. ICD-10 code:Z34.90 1 each 0   . Elastic Bandages & Supports (COMFORT FIT MATERNITY SUPP SM) MISC 1 Units by Does not apply route daily as needed. 1 each 0   . Misc. Devices (GOJJI WEIGHT SCALE) MISC 1 Device by Does not apply route daily as needed. To weight self daily as needed at home. ICD-10 code: Z34.90 1 each 0     Review of Systems  Constitutional: Negative for chills and fever.  Respiratory: Negative for cough and shortness of breath.   Gastrointestinal: Positive for abdominal pain (Pressure and Cramping). Negative for nausea and vomiting.  Genitourinary: Negative for difficulty  urinating, dysuria and pelvic pain.  Musculoskeletal: Positive for back pain.  Neurological: Positive for light-headedness. Negative for dizziness and headaches.   Physical Exam   Blood pressure 126/78, pulse 89, temperature 98.1 F (36.7 C), temperature source Oral, resp. rate 18, weight 70.2 kg, last menstrual period 04/28/2020, SpO2 100 %, not currently breastfeeding.  Physical Exam Constitutional:      General: She is not in acute distress.    Appearance: Normal appearance. She is not ill-appearing.  HENT:     Head: Normocephalic and atraumatic.  Eyes:     Conjunctiva/sclera: Conjunctivae normal.  Cardiovascular:     Rate and Rhythm: Normal rate and regular rhythm.     Pulses: Normal pulses.  Pulmonary:     Effort: Pulmonary effort is normal. No respiratory distress.     Breath sounds: Normal breath sounds.  Abdominal:     General: Bowel sounds are normal.     Comments: Gravid  Musculoskeletal:     Cervical back: Normal range of motion.     Right upper leg: No swelling, edema, deformity or tenderness.     Comments: Decreased ROM of right hip.   Skin:    General: Skin is warm and dry.  Neurological:     Mental Status: She is alert and oriented to person, place, and time.  Psychiatric:        Mood and Affect: Mood normal.        Behavior: Behavior normal.        Thought Content: Thought content normal.     Fetal Assessment 135 bpm, Mod Var, -Decels, +10x10 Accels Toco: No ctx graphed  MAU Course   Results for orders placed or performed during the hospital encounter of 12/07/20 (from the past 24 hour(s))  Urinalysis, Routine w reflex microscopic Urine, Clean Catch     Status: Abnormal   Collection Time: 12/07/20  3:11 PM  Result Value Ref Range   Color, Urine AMBER (A) YELLOW   APPearance CLOUDY (A) CLEAR   Specific Gravity, Urine 1.026 1.005 - 1.030   pH 7.0 5.0 - 8.0   Glucose, UA NEGATIVE NEGATIVE mg/dL   Hgb urine dipstick NEGATIVE NEGATIVE   Bilirubin  Urine NEGATIVE NEGATIVE   Ketones, ur NEGATIVE NEGATIVE mg/dL   Protein, ur 30 (A) NEGATIVE mg/dL   Nitrite NEGATIVE NEGATIVE   Leukocytes,Ua LARGE (A) NEGATIVE   RBC / HPF 0-5 0 - 5 RBC/hpf   WBC, UA 6-10 0 - 5 WBC/hpf   Bacteria, UA FEW (A) NONE  SEEN   Squamous Epithelial / LPF 21-50 0 - 5   Mucus PRESENT    No results found.  MDM PE Labs: UA, UC EFM Muscle Relaxant Assessment and Plan  24 year old G2P1001  SIUP at 30.5weeks Cat I FT Sciatica Pain   -POC Reviewed. -Exam performed and findings discussed. -Patient informed that complaint c/w sciatica pain. -Reviewed methods for relief including stretching, heat/cold compress, and pain medication. -Cautioned that symptoms may worsen as pregnancy progresses and PT offered. -Patient offered and initially declines pain medication and PT. -After discussion with FOB, patient agreeable to pain medication. -Will give Flexeril and reassess.  -NST overall reactive for GA. Okay to discontinue.   Cherre Robins MSN, CNM 12/07/2020, 3:53 PM    Reassessment (5:03 PM)  -Patient reports improvement in pain with flexeril and heating pad. -Pain now 0/10 without movement. -Discussed usage of flexeril after work  and heating pads as needed. -Discussed usage of belly support band while working and ambulating in hopes of decreasing irritation to sciatic nerve. -Patient and FOB without further questions or concerns. -Message sent and patient scheduled for ROB on 12/22 at 1010am.  Patient informed and instructed to contact office if unable to keep. -Rx for Flexeril sent to pharmacy on file.  -Encouraged to call or return to MAU if symptoms worsen or with the onset of new symptoms. -Discharged to home in stable condition.  Cherre Robins MSN, CNM Advanced Practice Provider, Center for [employer]

## 2020-12-08 LAB — URINE CULTURE: Culture: <10000 — AB

## 2020-12-21 ENCOUNTER — Encounter: Payer: Self-pay | Admitting: Obstetrics and Gynecology

## 2020-12-21 ENCOUNTER — Other Ambulatory Visit: Payer: Self-pay

## 2020-12-21 ENCOUNTER — Ambulatory Visit (INDEPENDENT_AMBULATORY_CARE_PROVIDER_SITE_OTHER): Payer: Medicaid Other | Admitting: Obstetrics and Gynecology

## 2020-12-21 VITALS — BP 111/56 | HR 101 | Temp 97.8°F | Wt 156.6 lb

## 2020-12-21 DIAGNOSIS — Z3A32 32 weeks gestation of pregnancy: Secondary | ICD-10-CM

## 2020-12-21 DIAGNOSIS — M5441 Lumbago with sciatica, right side: Secondary | ICD-10-CM

## 2020-12-21 DIAGNOSIS — Z3480 Encounter for supervision of other normal pregnancy, unspecified trimester: Secondary | ICD-10-CM

## 2020-12-21 NOTE — Progress Notes (Signed)
   PRENATAL VISIT NOTE  Subjective:  Nicole Harris is a 24 y.o. G2P1001 at [redacted]w[redacted]d being seen today for ongoing prenatal care.  She is currently monitored for the following issues for this low-risk pregnancy and has Anemia in pregnancy; Supervision of other normal pregnancy, antepartum; Hx of delivery by vacuum extraction, currently pregnant; and History of depression on their problem list.  Patient reports no bleeding, no contractions, no cramping, no leaking and sciatic nerve pain. Patient states "it's 8/10 when she's at work, resting resolves it a little bit.".  Contractions: Not present. Vag. Bleeding: None.  Movement: Present. Denies leaking of fluid.   The following portions of the patient's history were reviewed and updated as appropriate: allergies, current medications, past family history, past medical history, past social history, past surgical history and problem list.   Objective:   Vitals:   12/21/20 1031  BP: (!) 111/56  Pulse: (!) 101  Temp: 97.8 F (36.6 C)  Weight: 71 kg    Fetal Status: Fetal Heart Rate (bpm): 145 Fundal Height: 32 cm Movement: Present     General:  Alert, oriented and cooperative. Patient is in no acute distress.  Skin: Skin is warm and dry. No rash noted.   Cardiovascular: Normal heart rate noted  Respiratory: Normal respiratory effort, no problems with respiration noted  Abdomen: Soft, gravid, appropriate for gestational age.  Pain/Pressure: Present     Pelvic: Cervical exam deferred        Extremities: Normal range of motion.  Edema: None  Mental Status: Normal mood and affect. Normal behavior. Normal judgment and thought content.   Assessment and Plan:  Pregnancy: G2P1001 at [redacted]w[redacted]d 1. [redacted] weeks gestation of pregnancy   2. Acute right-sided low back pain with right-sided sciatica -Patient encouraged to rest when she can while at work    3. Supervision of other normal pregnancy, antepartum   Preterm labor symptoms and general obstetric  precautions including but not limited to vaginal bleeding, contractions, leaking of fluid and fetal movement were reviewed in detail with the patient. Please refer to After Visit Summary for other counseling recommendations.   Return in about 4 weeks (around 01/18/2021) for Return OB w/GBS.  Future Appointments  Date Time Provider Department Center  01/19/2021 10:50 AM Raelyn Mora, CNM [facility] None    Sande Rives, [place]

## 2020-12-25 IMAGING — US US MFM OB COMP +14 WKS
1 series · 13 of 28 positions shown · non-contrast
Comparison: none

[Series 1: us mfm ob comp +14 wks · 104 acquisitions, 13 frames shown]
[im 4/104]
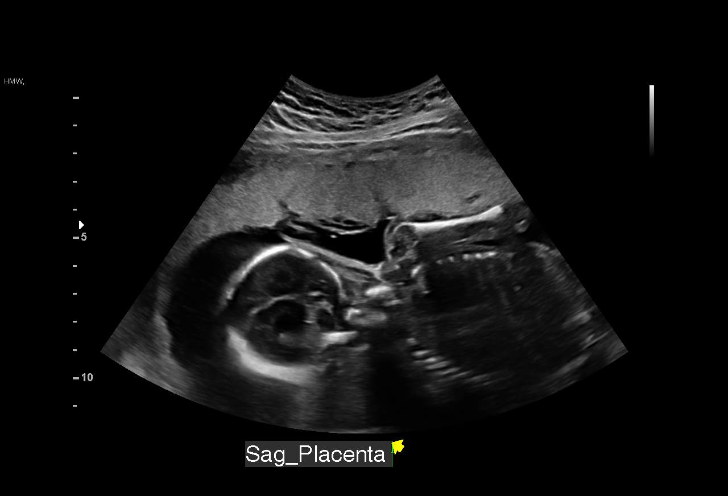
[im 12/104]
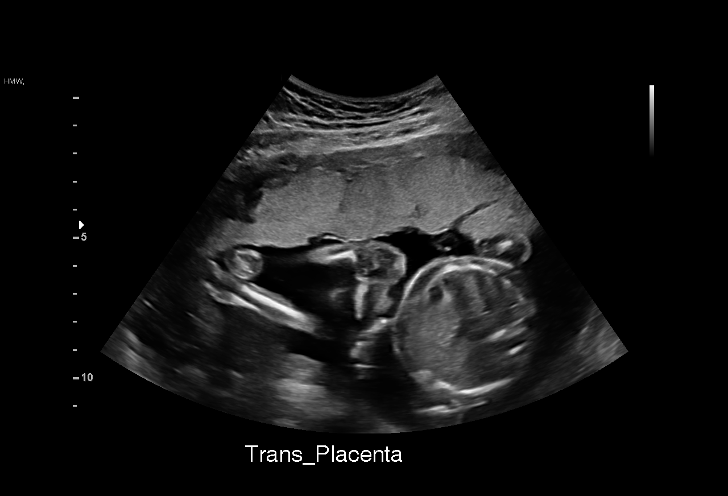
[im 20/104]
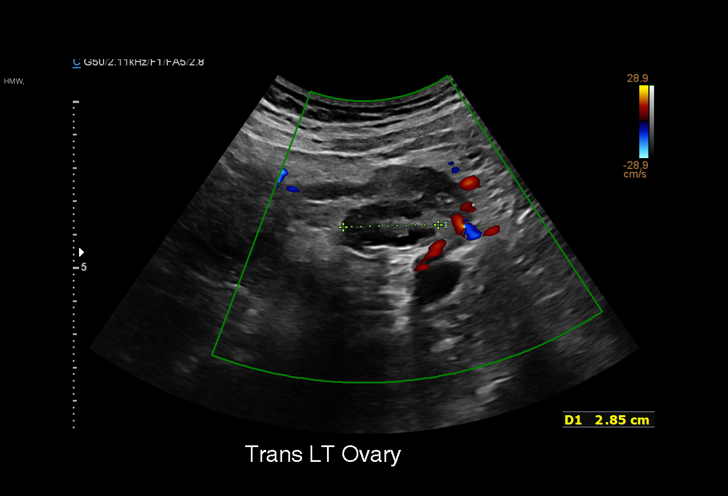
[im 27/104]
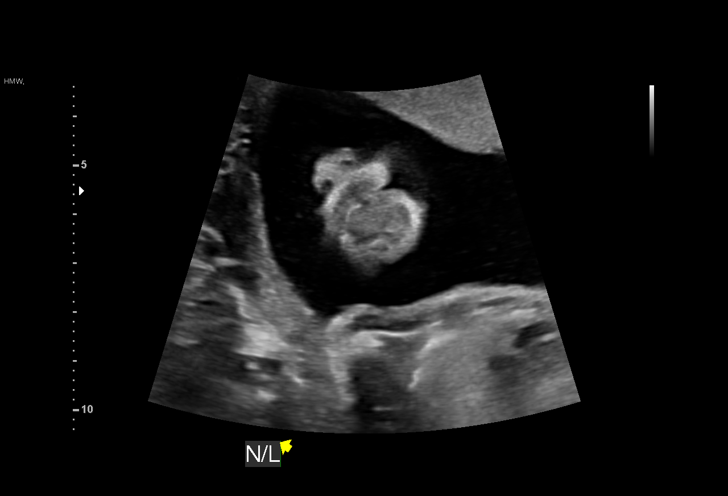
[im 35/104]
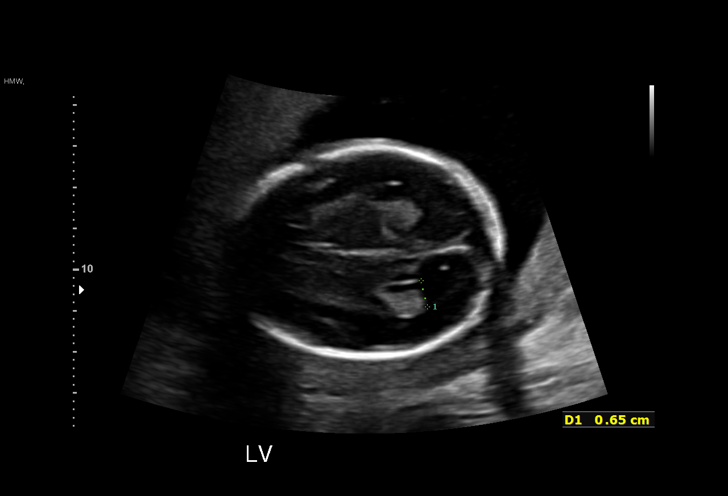
[im 42/104]
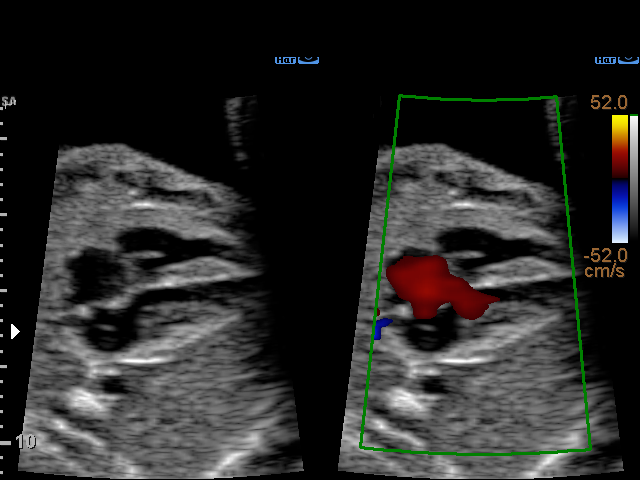
[im 54/104]
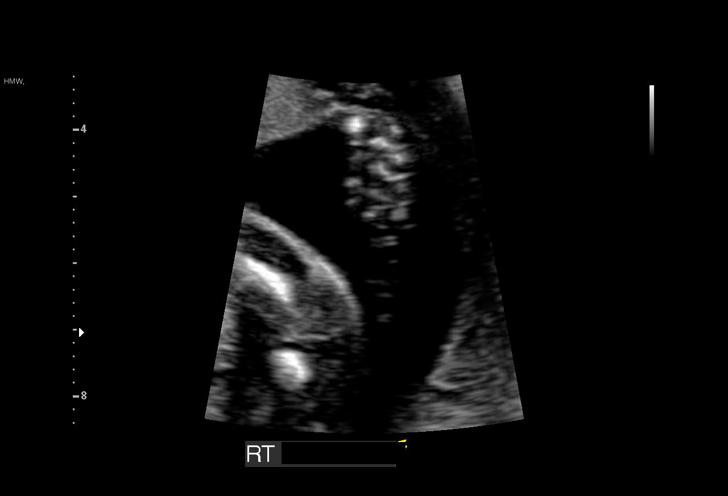
[im 62/104]
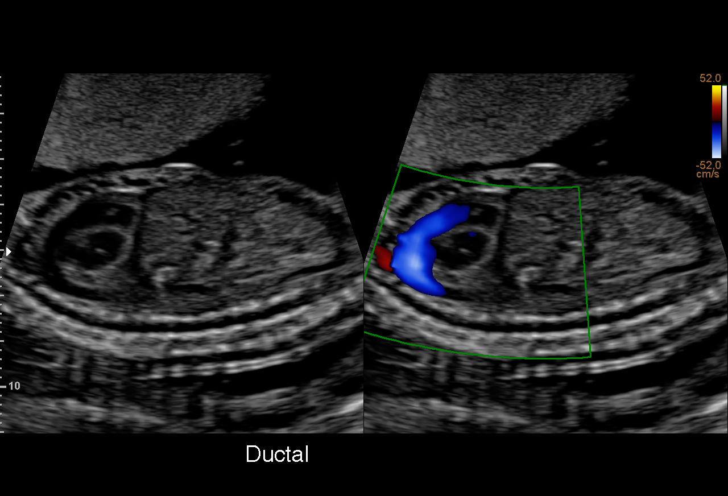
[im 69/104]
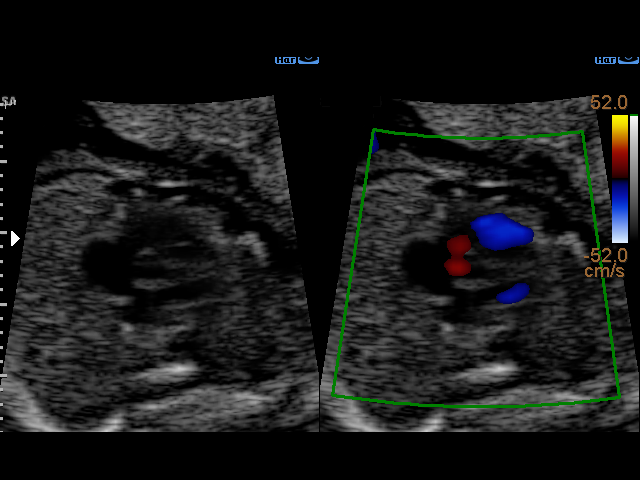
[im 77/104]
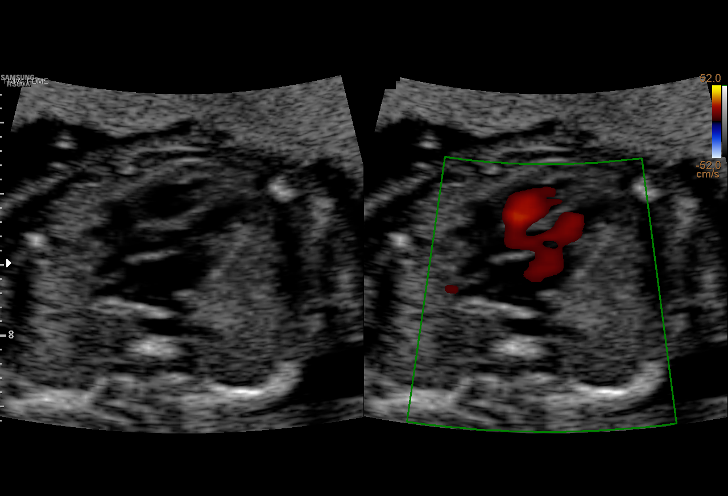
[im 84/104]
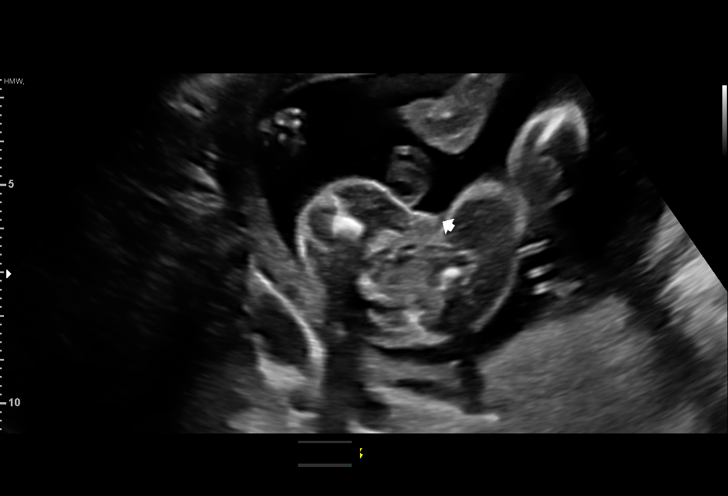
[im 92/104]
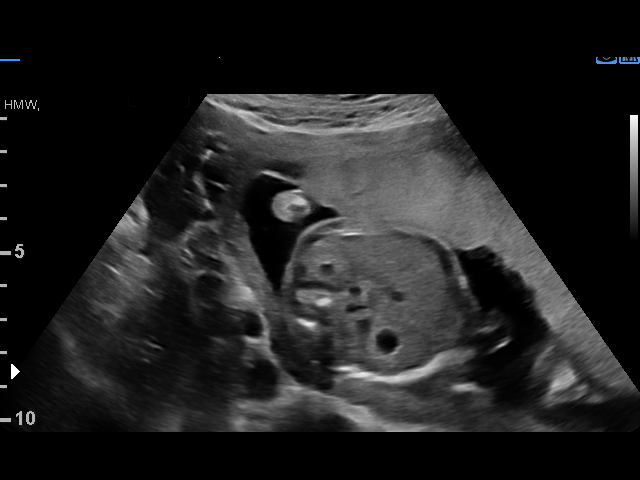
[im 100/104]
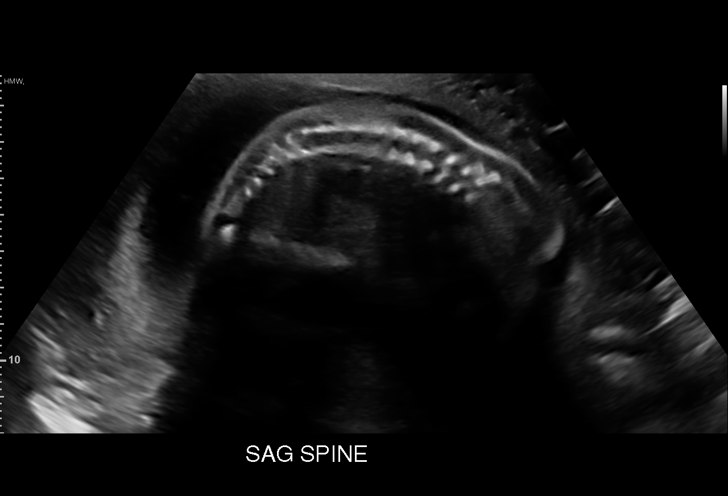

[13 of 28 positions shown; findings below may reference images not displayed]

Name:       CELSA BJORK               Visit Date: 09/29/2020 [DATE]

 1  US MFM OB COMP + 14 WK                76805.01    SHIELA OHANIAN

Indications

 20 weeks gestation of pregnancy
 Encounter for antenatal screening for
 malformations
 Low risk NIPS, 12.0 FF, AFP neg, neg
 Horizon
Fetal Evaluation

 Num Of Fetuses:         1
 Fetal Heart Rate(bpm):  148
 Cardiac Activity:       Observed
 Presentation:           Breech
 Placenta:               Anterior
 P. Cord Insertion:      Visualized, central

 Amniotic Fluid
 AFI FV:      Within normal limits

                             Largest Pocket(cm)

Biometry

 BPD:      49.9  mm     G. Age:  21w 1d         59  %    CI:        76.76   %    70 - 86
                                                         FL/HC:      19.2   %    15.9 -
 HC:      180.4  mm     G. Age:  20w 3d         24  %    HC/AC:      1.12        1.06 -
 AC:       161   mm     G. Age:  21w 1d         55  %    FL/BPD:     69.3   %
 FL:       34.6  mm     G. Age:  20w 6d         42  %    FL/AC:      21.5   %    20 - 24
 HUM:      33.6  mm     G. Age:  21w 3d         62  %
 CER:      20.4  mm     G. Age:  19w 4d         20  %
 LV:        6.5  mm
 CM:        6.1  mm
 Est. FW:     392  gm    0 lb 14 oz      53  %
OB History

 Gravidity:    2         Term:   1        Prem:   0        SAB:   0
 TOP:          0       Ectopic:  0        Living: 1
Gestational Age

 LMP:           22w 0d        Date:  04/28/20                 EDD:   02/02/21
 U/S Today:     20w 6d                                        EDD:   02/10/21
 Best:          20w 6d     Det. By:  Early Ultrasound         EDD:   02/10/21
                                     (06/21/20)
Anatomy

 Cranium:               Appears normal         LVOT:                   Appears normal
 Cavum:                 Appears normal         Aortic Arch:            Appears normal
 Ventricles:            Appears normal         Ductal Arch:            Appears normal
 Choroid Plexus:        Appears normal         Diaphragm:              Appears normal
 Cerebellum:            Appears normal         Stomach:                Appears normal, left
                                                                       sided
 Posterior Fossa:       Appears normal         Abdomen:                Appears normal
 Nuchal Fold:           Appears normal         Abdominal Wall:         Appears nml (cord
                                                                       insert, abd wall)
 Face:                  Appears normal         Cord Vessels:           Appears normal (3
                        (orbits and profile)                           vessel cord)
 Lips:                  Appears normal         Kidneys:                Appear normal
 Palate:                Appears normal         Bladder:                Appears normal
 Thoracic:              Appears normal         Spine:                  Appears normal
 Heart:                 Appears normal         Upper Extremities:      Appears normal
                        (4CH, axis, and
                        situs)
 RVOT:                  Appears normal         Lower Extremities:      Appears normal

 Other:  Fetus appears to be female. Heels and 5th digit visualized.
         Technically difficult due to fetal position.
Cervix Uterus Adnexa

 Cervix
 Length:            3.9  cm.
 Closed . Normal appearance by transabdominal scan.

 Uterus
 No abnormality visualized.

 Right Ovary
 No adnexal mass visualized.

 Left Ovary
 No adnexal mass visualized.

 Cul De Sac
 No free fluid seen.

 Adnexa
 No abnormality visualized.
Impression

 G2 P1. Patient is here for fetal anatomy scan.
 MSAFP screening showed low risk for open-neural tube
 defects .
 On cell-free fetal DNA screening, the risks of fetal
 aneuploidies are not increased .

 We performed fetal anatomy scan. No makers of
 aneuploidies or fetal structural defects are seen. Fetal
 biometry is consistent with her previously-established dates.
 Amniotic fluid is normal and good fetal activity is seen.
 Patient understands the limitations of ultrasound in detecting
 fetal anomalies.
Recommendations

 Follow-up scans as clinically indicated.
                 Nv, David Freddy

## 2020-12-31 NOTE — L&D Delivery Note (Addendum)
OB/GYN Faculty Practice Delivery Note  Nicole Harris is a 25 y.o. G2P1001 s/p VD at [redacted]w[redacted]d. She was admitted for elective IOL.   ROM: 5h 51m with meconium fluid GBS Status: negative Maximum Maternal Temperature: 98.1  Labor Progress: . Started induction with Cytotec, and FB was was placed. Progressed well with titration of Pitocin and SROM.   Delivery Date/Time: 02/05/21, 2043 Delivery: Called to room and patient was complete and pushing. Head delivered ROA. No nuchal cord present. L compound hand. Shoulder and body delivered in usual fashion. Infant with spontaneous cry, placed on mother's abdomen, dried and stimulated. Cord clamped x 2 after 1-minute delay, and cut by FOB under my direct supervision. Cord blood drawn. Placenta delivered spontaneously with gentle cord traction. Fundus firm with massage and Pitocin. Labia, perineum, vagina, and cervix were inspected, with no lacerations.   Placenta: complete, three vessel cord appreciated Complications: none Lacerations: none EBL: 50 mL Analgesia: epidural   Infant: APGARs 9, 8  weight pending   Franchot Erichsen, DO [facility] [employer], [facility]    I was present and scrubbed for the entirety of the delivery.  I agree with the findings and the plan of care as documented in the resident's note.  Casper Harrison, MD [facility] Family Medicine Fellow, [facility] for [facility], [facility]

## 2021-01-19 ENCOUNTER — Encounter: Payer: Medicaid Other | Admitting: Obstetrics and Gynecology

## 2021-01-26 ENCOUNTER — Other Ambulatory Visit: Payer: Self-pay

## 2021-01-26 ENCOUNTER — Other Ambulatory Visit (HOSPITAL_COMMUNITY)
Admission: RE | Admit: 2021-01-26 | Discharge: 2021-01-26 | Disposition: A | Discharge status: Home / Self Care | Payer: Medicaid Other | Source: Ambulatory Visit | Attending: Obstetrics and Gynecology | Admitting: Obstetrics and Gynecology

## 2021-01-26 ENCOUNTER — Ambulatory Visit (INDEPENDENT_AMBULATORY_CARE_PROVIDER_SITE_OTHER): Payer: Medicaid Other | Admitting: Obstetrics and Gynecology

## 2021-01-26 VITALS — BP 112/73 | HR 103 | Temp 97.9°F | Wt 163.0 lb

## 2021-01-26 DIAGNOSIS — O99019 Anemia complicating pregnancy, unspecified trimester: Secondary | ICD-10-CM

## 2021-01-26 DIAGNOSIS — Z3480 Encounter for supervision of other normal pregnancy, unspecified trimester: Secondary | ICD-10-CM | POA: Insufficient documentation

## 2021-01-26 DIAGNOSIS — O26843 Uterine size-date discrepancy, third trimester: Secondary | ICD-10-CM

## 2021-01-26 NOTE — Progress Notes (Signed)
   LOW-RISK PREGNANCY OFFICE VISIT Patient name: Nicole Harris MRN [id number]  Date of birth: [birth year] Chief Complaint:   Routine Prenatal Visit  History of Present Illness:   Nicole Harris is a 25 y.o. G76P1001 female at [redacted]w[redacted]d with an Estimated Date of Delivery: 02/10/21 being seen today for ongoing management of a low-risk pregnancy.  Today she reports backache, occasional contractions and increased pelvic pressure. Contractions: Irregular. Vag. Bleeding: None.  Movement: Present. denies leaking of fluid. Review of Systems:   Pertinent items are noted in HPI Denies abnormal vaginal discharge w/ itching/odor/irritation, headaches, visual changes, shortness of breath, chest pain, abdominal pain, severe nausea/vomiting, or problems with urination or bowel movements unless otherwise stated above. Pertinent History Reviewed:  Reviewed past medical,surgical, social, obstetrical and family history.  Reviewed problem list, medications and allergies. Physical Assessment:   Vitals:   01/26/21 1058  BP: 112/73  Pulse: (!) 103  Temp: 97.9 F (36.6 C)  Weight: 163 lb (73.9 kg)  Body mass index is 27.98 kg/m.        Physical Examination:   General appearance: Well appearing, and in no distress  Mental status: Alert, oriented to person, place, and time  Skin: Warm & dry  Cardiovascular: Normal heart rate noted  Respiratory: Normal respiratory effort, no distress  Abdomen: Soft, gravid, nontender  Pelvic: Cervical exam performed         Extremities: Edema: None  Fetal Status: Fetal Heart Rate (bpm): 148 Fundal Height: 41 cm Movement: Present Presentation: Vertex  Patient informed that the ultrasound is considered a limited OB ultrasound and is not intended to be a complete ultrasound exam.  Patient also informed that the ultrasound is not being completed with the intent of assessing for fetal or placental anomalies or any pelvic abnormalities.  Explained that the purpose of today's  ultrasound is to assess for presentation.  Baby was found to be in a cephalic presentation. Patient acknowledges the purpose of the exam and the limitations of the study.   Assessment & Plan:  1) Low-risk pregnancy G2P1001 at [redacted]w[redacted]d with an Estimated Date of Delivery: 02/10/21   2) Supervision of other normal pregnancy, antepartum  - Cervicovaginal ancillary only( [place]),  - Culture, beta strep (group b only),  - Discussed normalcy of increased pelvic pressure  3) Antepartum anemia  - CBC  4) Uterine size date discrepancy pregnancy, third trimester  - H/O large baby with last pregnancy;  - [place] MFM OB FOLLOW UP on Monday 01/30/2021 @ 0715     Meds: No orders of the defined types were placed in this encounter.  Labs/procedures today: BS U/S, cervical exam and CBC  Plan:  Continue routine obstetrical care   Reviewed: Term labor symptoms and general obstetric precautions including but not limited to vaginal bleeding, contractions, leaking of fluid and fetal movement were reviewed in detail with the patient.  All questions were answered. Has home bp cuff. Check bp weekly, let us know if >140/90.   Follow-up: No follow-ups on file.  Orders Placed This Encounter  Procedures  . Culture, beta strep (group b only)  . [place] MFM OB FOLLOW UP  . CBC   Raelyn Mora MSN, CNM 01/26/2021 11:23 AM

## 2021-01-27 LAB — CBC
Hematocrit: 28.1 % — ABNORMAL LOW (ref 34.0–46.6)
Hemoglobin: 9.2 g/dL — ABNORMAL LOW (ref 11.1–15.9)
MCH: 25.9 pg — ABNORMAL LOW (ref 26.6–33.0)
MCHC: 32.7 g/dL (ref 31.5–35.7)
MCV: 79 fL (ref 79–97)
Platelets: 257 10*3/uL (ref 150–450)
RBC: 3.55 x10E6/uL — ABNORMAL LOW (ref 3.77–5.28)
RDW: 13.1 % (ref 11.7–15.4)
WBC: 9.1 10*3/uL (ref 3.4–10.8)

## 2021-01-30 ENCOUNTER — Ambulatory Visit: Payer: Medicaid Other | Admitting: *Deleted

## 2021-01-30 ENCOUNTER — Encounter: Payer: Self-pay | Admitting: *Deleted

## 2021-01-30 ENCOUNTER — Other Ambulatory Visit: Payer: Self-pay

## 2021-01-30 ENCOUNTER — Ambulatory Visit: Payer: Medicaid Other | Attending: Obstetrics and Gynecology

## 2021-01-30 DIAGNOSIS — O26843 Uterine size-date discrepancy, third trimester: Secondary | ICD-10-CM | POA: Insufficient documentation

## 2021-01-30 DIAGNOSIS — Z3A38 38 weeks gestation of pregnancy: Secondary | ICD-10-CM

## 2021-01-30 DIAGNOSIS — O09299 Supervision of pregnancy with other poor reproductive or obstetric history, unspecified trimester: Secondary | ICD-10-CM | POA: Insufficient documentation

## 2021-01-30 DIAGNOSIS — Z3480 Encounter for supervision of other normal pregnancy, unspecified trimester: Secondary | ICD-10-CM

## 2021-01-30 DIAGNOSIS — Z8759 Personal history of other complications of pregnancy, childbirth and the puerperium: Secondary | ICD-10-CM | POA: Diagnosis not present

## 2021-01-30 LAB — CERVICOVAGINAL ANCILLARY ONLY
Bacterial Vaginitis (gardnerella): NEGATIVE
Candida Glabrata: NEGATIVE
Candida Vaginitis: POSITIVE — AB
Chlamydia: NEGATIVE
Comment: NEGATIVE
Comment: NEGATIVE
Comment: NEGATIVE
Comment: NEGATIVE
Comment: NEGATIVE
Comment: NORMAL
Neisseria Gonorrhea: NEGATIVE
Trichomonas: NEGATIVE

## 2021-01-31 ENCOUNTER — Telehealth: Payer: Self-pay | Admitting: *Deleted

## 2021-01-31 ENCOUNTER — Other Ambulatory Visit: Payer: Self-pay | Admitting: *Deleted

## 2021-01-31 DIAGNOSIS — B3731 Acute candidiasis of vulva and vagina: Secondary | ICD-10-CM

## 2021-01-31 DIAGNOSIS — B373 Candidiasis of vulva and vagina: Secondary | ICD-10-CM

## 2021-01-31 LAB — CULTURE, BETA STREP (GROUP B ONLY): Strep Gp B Culture: NEGATIVE

## 2021-01-31 MED ORDER — TERCONAZOLE 0.4 % VA CREA: 1.0000 | TOPICAL_CREAM | Freq: Every day | VAGINAL | 0 refills | Status: DC

## 2021-01-31 NOTE — Telephone Encounter (Signed)
-----   Message from Raelyn Mora, [place] sent at 01/31/2021  8:55 AM EST ----- Please treat for yeast

## 2021-02-02 ENCOUNTER — Other Ambulatory Visit: Payer: Self-pay | Admitting: Obstetrics and Gynecology

## 2021-02-02 ENCOUNTER — Ambulatory Visit (INDEPENDENT_AMBULATORY_CARE_PROVIDER_SITE_OTHER): Payer: Medicaid Other | Admitting: Obstetrics and Gynecology

## 2021-02-02 ENCOUNTER — Encounter (HOSPITAL_COMMUNITY): Payer: Self-pay | Admitting: *Deleted

## 2021-02-02 ENCOUNTER — Other Ambulatory Visit: Payer: Self-pay

## 2021-02-02 ENCOUNTER — Telehealth (HOSPITAL_COMMUNITY): Payer: Self-pay | Admitting: *Deleted

## 2021-02-02 VITALS — BP 112/75 | HR 86 | Temp 97.9°F | Wt 163.6 lb

## 2021-02-02 DIAGNOSIS — D509 Iron deficiency anemia, unspecified: Secondary | ICD-10-CM

## 2021-02-02 DIAGNOSIS — O26843 Uterine size-date discrepancy, third trimester: Secondary | ICD-10-CM

## 2021-02-02 DIAGNOSIS — Z3A38 38 weeks gestation of pregnancy: Secondary | ICD-10-CM

## 2021-02-02 DIAGNOSIS — Z3480 Encounter for supervision of other normal pregnancy, unspecified trimester: Secondary | ICD-10-CM

## 2021-02-02 DIAGNOSIS — O99019 Anemia complicating pregnancy, unspecified trimester: Secondary | ICD-10-CM

## 2021-02-02 NOTE — Telephone Encounter (Signed)
Preadmission screen  

## 2021-02-02 NOTE — Progress Notes (Signed)
LOW-RISK PREGNANCY OFFICE VISIT Patient name: Nicole Harris MRN [id number]  Date of birth: 1996-06-19 Chief Complaint:   Routine Prenatal Visit  History of Present Illness:   Nicole Harris is a 25 y.o. G77P1001 female at [redacted]w[redacted]d with an Estimated Date of Delivery: 02/10/21 being seen today for ongoing management of a low-risk pregnancy.  Today she reports no complaints. Contractions: Not present. Vag. Bleeding: None.  Movement: Present. denies leaking of fluid. Review of Systems:   Pertinent items are noted in HPI Denies abnormal vaginal discharge w/ itching/odor/irritation, headaches, visual changes, shortness of breath, chest pain, abdominal pain, severe nausea/vomiting, or problems with urination or bowel movements unless otherwise stated above. Pertinent History Reviewed:  Reviewed past medical,surgical, social, obstetrical and family history.  Reviewed problem list, medications and allergies. Physical Assessment:   Vitals:   02/02/21 1001  BP: 112/75  Pulse: 86  Temp: 97.9 F (36.6 C)  Weight: 163 lb 9.6 oz (74.2 kg)  Body mass index is 28.08 kg/m.        Physical Examination:   General appearance: Well appearing, and in no distress  Mental status: Alert, oriented to person, place, and time  Skin: Warm & dry  Cardiovascular: Normal heart rate noted  Respiratory: Normal respiratory effort, no distress  Abdomen: Soft, gravid, nontender  Pelvic: Cervical exam deferred -- Patient declined        Extremities: Edema: None  Fetal Status: Fetal Heart Rate (bpm): 140   Movement: Present      [place] MFM OB FOLLOW UP (Accession [id number]) (Order [id number])  Narrative & Impression  ----------------------------------------------------------------------  OBSTETRICS REPORT                       (Signed Final 01/30/2021 08:14 am) ---------------------------------------------------------------------- Patient Info  ID #:       [id number]                          D.O.B.:  Dec 22, 1996  (24 yrs)  Name:       Nicole Harris               Visit Date: 01/30/2021 08:00 am ---------------------------------------------------------------------- Performed By  Attending:        Noralee Space MD        Referred By:      [facility]  Performed By:     Emeline Darling BS,      [facility]                                                             [facility] ---------------------------------------------------------------------- Orders  #  Description                           Code        Ordered By  1  US MFM OB FOLLOW UP                   [id number]    Rhyen Mazariego ----------------------------------------------------------------------  #  Order #                     Accession #                Episode #  1  [id number]                   [id number]                 [id number] ---------------------------------------------------------------------- Indications  Uterine size-date discrepancy, third trimester O26.843  Low risk NIPS, 12.0 FF, AFP neg, neg  [place]  [redacted] weeks gestation of pregnancy                Z3A.38 ---------------------------------------------------------------------- Fetal Evaluation  Num Of Fetuses:         1  Fetal Heart Rate(bpm):  122  Cardiac Activity:       Observed  Presentation:           Cephalic  Placenta:               Anterior  P. Cord Insertion:      Previously Visualized  Amniotic Fluid  AFI FV:      Within normal limits  AFI Sum(cm)     %Tile       Largest Pocket(cm)  14.1            55          6.1  RUQ(cm)       RLQ(cm)       LUQ(cm)        LLQ(cm)  2.1           5.2           6.1            0.7 ---------------------------------------------------------------------- Biometry  BPD:      97.7  mm     G. Age:  40w 0d         97  %    CI:        80.94   %    70 - 86                                                           FL/HC:      21.5   %    20.9 - 22.7  HC:      342.9  mm     G. Age:  39w 4d         61  %    HC/AC:      0.92        0.92 - 1.05  AC:      372.6  mm     G. Age:  41w 1d       > 99  %    FL/BPD:  75.4   %    71 - 87  FL:       73.7  mm     G. Age:  37w 5d         36  %    FL/AC:      19.8   %    20 - 24  Est. FW:    4012  gm    8 lb 14 oz      95  % ---------------------------------------------------------------------- OB History  Gravidity:    2         Term:   1        Prem:   0        SAB:   0  TOP:          0       Ectopic:  0        Living: 1 ---------------------------------------------------------------------- Gestational Age  LMP:           39w 4d        Date:  04/28/20                 EDD:   02/02/21  U/S Today:     39w 4d                                        EDD:   02/02/21  Best:          38w 3d     Det. ByMarcella Dubs         EDD:   02/10/21                                      (06/21/20) ---------------------------------------------------------------------- Anatomy  Cranium:               Appears normal         LVOT:                   Previously seen  Cavum:                 Previously seen        Aortic Arch:            Previously seen  Ventricles:            Previously seen        Ductal Arch:            Previously seen  Choroid Plexus:        Previously seen        Diaphragm:              Appears normal  Cerebellum:            Previously seen        Stomach:                Appears normal, left  sided  Posterior Fossa:       Previously seen        Abdomen:                Appears normal  Nuchal Fold:           Previously seen        Abdominal Wall:         Previously seen  Face:                  Orbits and profile     Cord Vessels:           Previously seen                         previously seen  Lips:                  Previously seen        Kidneys:                 Appear normal  Palate:                Previously seen        Bladder:                Appears normal  Thoracic:              Appears normal         Spine:                  Previously seen  Heart:                 Appears normal         Upper Extremities:      Previously seen                         (4CH, axis, and                         situs)  RVOT:                  Previously seen        Lower Extremities:      Previously seen  Other:  Fetus appears to be female. Heels and 5th digit previously visualized.          Technically difficult due to fetal position. ---------------------------------------------------------------------- Cervix Uterus Adnexa  Cervix  Not visualized (advanced GA >24wks) ---------------------------------------------------------------------- Impression  Uterine-size-date discrepancy .  Obstetric history significant  for a vaginal delivery of a female infant weighing 399 4 g at  birth.  Patient had vacuum-assisted delivery.  She does not have gestational diabetes.  Blood pressure today at her office is 111/49 mmHg  On today's ultrasound, the estimated fetal weight is at the  95th percentile.  Amniotic fluid is normal and good fetal  activity seen.  I counseled the patient that ultrasound has limitations in  accurately estimating fetal weights. ---------------------------------------------------------------------- Recommendations  Follow-up scans as clinically indicated. ----------------------------------------------------------------------                  Noralee Space, MD Electronically Signed Final Report   01/30/2021 08:14 am ----------------------------------------------------------------------      Assessment & Plan:  1) Low-risk pregnancy G2P1001 at [redacted]w[redacted]d with an Estimated Date of Delivery: 02/10/21   2) Supervision of other normal pregnancy, antepartum - Reviewed  labor precautions - Patient desires IOL on due date, "so baby does not get any  bigger." - IOL scheduled for 02/05/2021 @ 0645  will need Cytotec, FB, Pitocin, AROM for IOL methods d/t last cervical exam  3) Iron deficiency anemia of pregnancy - Taking iron supplements daily  4) Uterine size date discrepancy pregnancy, third trimester - Growth U/S on 01/30/2021 - see above  5) [redacted] weeks gestation of pregnancy     Meds: No orders of the defined types were placed in this encounter.  Labs/procedures today: none  Plan:  Continue routine obstetrical care   Reviewed: Term labor symptoms and general obstetric precautions including but not limited to vaginal bleeding, contractions, leaking of fluid and fetal movement were reviewed in detail with the patient.  All questions were answered. Has home bp cuff. Check bp weekly, let us know if >140/90.   Follow-up: No follow-ups on file.  No orders of the defined types were placed in this encounter.  Raelyn Mora MSN, CNM 02/02/2021 10:33 AM

## 2021-02-03 ENCOUNTER — Other Ambulatory Visit (HOSPITAL_COMMUNITY)
Admission: RE | Admit: 2021-02-03 | Discharge: 2021-02-03 | Disposition: A | Discharge status: Home / Self Care | Payer: Medicaid Other | Source: Ambulatory Visit | Attending: Family Medicine | Admitting: Family Medicine

## 2021-02-03 DIAGNOSIS — Z01812 Encounter for preprocedural laboratory examination: Secondary | ICD-10-CM | POA: Diagnosis present

## 2021-02-03 DIAGNOSIS — Z20822 Contact with and (suspected) exposure to covid-19: Secondary | ICD-10-CM | POA: Insufficient documentation

## 2021-02-03 LAB — SARS CORONAVIRUS 2 (TAT 6-24 HRS): SARS Coronavirus 2: NEGATIVE

## 2021-02-05 ENCOUNTER — Inpatient Hospital Stay (HOSPITAL_COMMUNITY): Payer: Medicaid Other | Admitting: Anesthesiology

## 2021-02-05 ENCOUNTER — Other Ambulatory Visit: Payer: Self-pay

## 2021-02-05 ENCOUNTER — Encounter (HOSPITAL_COMMUNITY): Payer: Self-pay | Admitting: Obstetrics and Gynecology

## 2021-02-05 ENCOUNTER — Inpatient Hospital Stay (HOSPITAL_COMMUNITY): Payer: Medicaid Other

## 2021-02-05 ENCOUNTER — Inpatient Hospital Stay (HOSPITAL_COMMUNITY)
Admission: AD | Admit: 2021-02-05 | Discharge: 2021-02-07 | DRG: 807 | Disposition: A | Discharge status: Home / Self Care | Payer: Medicaid Other | Attending: Obstetrics and Gynecology | Admitting: Obstetrics and Gynecology

## 2021-02-05 DIAGNOSIS — O322XX0 Maternal care for transverse and oblique lie, not applicable or unspecified: Principal | ICD-10-CM | POA: Diagnosis present

## 2021-02-05 DIAGNOSIS — Z87891 Personal history of nicotine dependence: Secondary | ICD-10-CM

## 2021-02-05 DIAGNOSIS — Z91040 Latex allergy status: Secondary | ICD-10-CM

## 2021-02-05 DIAGNOSIS — O26893 Other specified pregnancy related conditions, third trimester: Secondary | ICD-10-CM | POA: Diagnosis present

## 2021-02-05 DIAGNOSIS — D649 Anemia, unspecified: Secondary | ICD-10-CM | POA: Diagnosis present

## 2021-02-05 DIAGNOSIS — Z8659 Personal history of other mental and behavioral disorders: Secondary | ICD-10-CM

## 2021-02-05 DIAGNOSIS — Z3A39 39 weeks gestation of pregnancy: Secondary | ICD-10-CM

## 2021-02-05 DIAGNOSIS — O326XX0 Maternal care for compound presentation, not applicable or unspecified: Secondary | ICD-10-CM | POA: Diagnosis not present

## 2021-02-05 DIAGNOSIS — O9902 Anemia complicating childbirth: Secondary | ICD-10-CM | POA: Diagnosis present

## 2021-02-05 DIAGNOSIS — O99019 Anemia complicating pregnancy, unspecified trimester: Secondary | ICD-10-CM | POA: Diagnosis present

## 2021-02-05 LAB — TYPE AND SCREEN
ABO/RH(D): O POS
Antibody Screen: NEGATIVE

## 2021-02-05 LAB — CBC
HCT: 26.2 % — ABNORMAL LOW (ref 36.0–46.0)
Hemoglobin: 8.3 g/dL — ABNORMAL LOW (ref 12.0–15.0)
MCH: 26.4 pg (ref 26.0–34.0)
MCHC: 31.7 g/dL (ref 30.0–36.0)
MCV: 83.4 fL (ref 80.0–100.0)
Platelets: 202 10*3/uL (ref 150–400)
RBC: 3.14 MIL/uL — ABNORMAL LOW (ref 3.87–5.11)
RDW: 14.8 % (ref 11.5–15.5)
WBC: 7.5 10*3/uL (ref 4.0–10.5)
nRBC: 0 % (ref 0.0–0.2)

## 2021-02-05 LAB — RPR: RPR Ser Ql: NONREACTIVE

## 2021-02-05 MED ORDER — PHENYLEPHRINE 40 MCG/ML (10ML) SYRINGE FOR IV PUSH (FOR BLOOD PRESSURE SUPPORT): 80.0000 ug | PREFILLED_SYRINGE | INTRAVENOUS | Status: DC | PRN

## 2021-02-05 MED ORDER — EPHEDRINE 5 MG/ML INJ: 10.0000 mg | INTRAVENOUS | Status: DC | PRN

## 2021-02-05 MED ORDER — ACETAMINOPHEN 325 MG PO TABS
650.0000 mg | ORAL_TABLET | ORAL | Status: DC | PRN
Start: 2021-02-05 — End: 2021-02-05

## 2021-02-05 MED ORDER — ONDANSETRON HCL 4 MG/2ML IJ SOLN
4.0000 mg | Freq: Four times a day (QID) | INTRAMUSCULAR | Status: DC | PRN
  Administered 2021-02-05 (×2): 4 mg via INTRAVENOUS
  Filled 2021-02-05 (×2): qty 2

## 2021-02-05 MED ORDER — LIDOCAINE-EPINEPHRINE (PF) 2 %-1:200000 IJ SOLN
INTRAMUSCULAR | Status: DC | PRN
  Administered 2021-02-05: 5 mL via EPIDURAL

## 2021-02-05 MED ORDER — ONDANSETRON HCL 4 MG/2ML IJ SOLN: 4.0000 mg | INTRAMUSCULAR | Status: DC | PRN

## 2021-02-05 MED ORDER — SOD CITRATE-CITRIC ACID 500-334 MG/5ML PO SOLN: 30.0000 mL | ORAL | Status: DC | PRN

## 2021-02-05 MED ORDER — OXYCODONE-ACETAMINOPHEN 5-325 MG PO TABS: 1.0000 | ORAL_TABLET | ORAL | Status: DC | PRN

## 2021-02-05 MED ORDER — LACTATED RINGERS IV SOLN: INTRAVENOUS | Status: DC

## 2021-02-05 MED ORDER — DIBUCAINE (PERIANAL) 1 % EX OINT: 1.0000 "application " | TOPICAL_OINTMENT | CUTANEOUS | Status: DC | PRN

## 2021-02-05 MED ORDER — TETANUS-DIPHTH-ACELL PERTUSSIS 5-2.5-18.5 LF-MCG/0.5 IM SUSY: 0.5000 mL | PREFILLED_SYRINGE | Freq: Once | INTRAMUSCULAR | Status: DC

## 2021-02-05 MED ORDER — OXYTOCIN BOLUS FROM INFUSION
333.0000 mL | Freq: Once | INTRAVENOUS | Status: AC
  Administered 2021-02-05: 333 mL via INTRAVENOUS

## 2021-02-05 MED ORDER — SIMETHICONE 80 MG PO CHEW: 80.0000 mg | CHEWABLE_TABLET | ORAL | Status: DC | PRN

## 2021-02-05 MED ORDER — OXYCODONE-ACETAMINOPHEN 5-325 MG PO TABS: 2.0000 | ORAL_TABLET | ORAL | Status: DC | PRN

## 2021-02-05 MED ORDER — LACTATED RINGERS IV SOLN: 500.0000 mL | INTRAVENOUS | Status: DC | PRN

## 2021-02-05 MED ORDER — COCONUT OIL OIL: 1.0000 "application " | TOPICAL_OIL | Status: DC | PRN

## 2021-02-05 MED ORDER — LACTATED RINGERS IV SOLN: 500.0000 mL | Freq: Once | INTRAVENOUS | Status: DC

## 2021-02-05 MED ORDER — FENTANYL CITRATE (PF) 100 MCG/2ML IJ SOLN: 100.0000 ug | INTRAMUSCULAR | Status: DC | PRN

## 2021-02-05 MED ORDER — OXYTOCIN-SODIUM CHLORIDE 30-0.9 UT/500ML-% IV SOLN
1.0000 m[IU]/min | INTRAVENOUS | Status: DC
  Administered 2021-02-05: 2 m[IU]/min via INTRAVENOUS
  Filled 2021-02-05: qty 500

## 2021-02-05 MED ORDER — TERBUTALINE SULFATE 1 MG/ML IJ SOLN: 0.2500 mg | Freq: Once | INTRAMUSCULAR | Status: DC | PRN

## 2021-02-05 MED ORDER — WITCH HAZEL-GLYCERIN EX PADS: 1.0000 "application " | MEDICATED_PAD | CUTANEOUS | Status: DC | PRN

## 2021-02-05 MED ORDER — MISOPROSTOL 50MCG HALF TABLET
50.0000 ug | ORAL_TABLET | ORAL | Status: DC | PRN
  Administered 2021-02-05: 50 ug via BUCCAL
  Filled 2021-02-05: qty 1

## 2021-02-05 MED ORDER — PRENATAL MULTIVITAMIN CH
1.0000 | ORAL_TABLET | Freq: Every day | ORAL | Status: DC
  Filled 2021-02-05: qty 1

## 2021-02-05 MED ORDER — OXYTOCIN-SODIUM CHLORIDE 30-0.9 UT/500ML-% IV SOLN: 2.5000 [IU]/h | INTRAVENOUS | Status: DC

## 2021-02-05 MED ORDER — BENZOCAINE-MENTHOL 20-0.5 % EX AERO: 1.0000 "application " | INHALATION_SPRAY | CUTANEOUS | Status: DC | PRN

## 2021-02-05 MED ORDER — ONDANSETRON HCL 4 MG PO TABS: 4.0000 mg | ORAL_TABLET | ORAL | Status: DC | PRN

## 2021-02-05 MED ORDER — OXYTOCIN 10 UNIT/ML IJ SOLN: 10.0000 [IU] | Freq: Once | INTRAMUSCULAR | Status: DC | PRN

## 2021-02-05 MED ORDER — DIPHENHYDRAMINE HCL 25 MG PO CAPS
25.0000 mg | ORAL_CAPSULE | Freq: Four times a day (QID) | ORAL | Status: DC | PRN
Start: 2021-02-05 — End: 2021-02-07

## 2021-02-05 MED ORDER — SENNOSIDES-DOCUSATE SODIUM 8.6-50 MG PO TABS
2.0000 | ORAL_TABLET | Freq: Every day | ORAL | Status: DC
  Administered 2021-02-06: 2 via ORAL
  Filled 2021-02-05: qty 2

## 2021-02-05 MED ORDER — ACETAMINOPHEN 325 MG PO TABS
650.0000 mg | ORAL_TABLET | ORAL | Status: DC | PRN
  Administered 2021-02-06: 650 mg via ORAL
  Filled 2021-02-05: qty 2

## 2021-02-05 MED ORDER — DIPHENHYDRAMINE HCL 50 MG/ML IJ SOLN: 12.5000 mg | INTRAMUSCULAR | Status: DC | PRN

## 2021-02-05 MED ORDER — IBUPROFEN 600 MG PO TABS
600.0000 mg | ORAL_TABLET | Freq: Four times a day (QID) | ORAL | Status: DC
  Administered 2021-02-06 – 2021-02-07 (×3): 600 mg via ORAL
  Filled 2021-02-05 (×5): qty 1

## 2021-02-05 MED ORDER — FENTANYL-BUPIVACAINE-NACL 0.5-0.125-0.9 MG/250ML-% EP SOLN
12.0000 mL/h | EPIDURAL | Status: DC | PRN
  Administered 2021-02-05: 12 mL/h via EPIDURAL
  Filled 2021-02-05: qty 250

## 2021-02-05 MED ORDER — OXYTOCIN-SODIUM CHLORIDE 30-0.9 UT/500ML-% IV SOLN: 1.0000 m[IU]/min | INTRAVENOUS | Status: DC

## 2021-02-05 MED ORDER — TRANEXAMIC ACID-NACL 1000-0.7 MG/100ML-% IV SOLN: 1000.0000 mg | Freq: Once | INTRAVENOUS | Status: DC

## 2021-02-05 MED ORDER — TERBUTALINE SULFATE 1 MG/ML IJ SOLN
0.2500 mg | Freq: Once | INTRAMUSCULAR | Status: AC | PRN
  Administered 2021-02-05: 0.25 mg via SUBCUTANEOUS
  Filled 2021-02-05: qty 1

## 2021-02-05 MED ORDER — LIDOCAINE HCL (PF) 1 % IJ SOLN: 30.0000 mL | INTRAMUSCULAR | Status: DC | PRN

## 2021-02-05 NOTE — Discharge Summary (Signed)
Postpartum Discharge Summary    Patient Name: Nicole Harris DOB: Aug 22, 1996 MRN: [id number]  Date of admission: 02/05/2021 Delivery date:02/05/2021  Delivering provider: Randa Ngo  Date of discharge: 02/07/2021  Admitting diagnosis: Indication for care in labor or delivery [O75.9] Intrauterine pregnancy: [redacted]w[redacted]d     Secondary diagnosis:  Active Problems:   Anemia in pregnancy   History of depression   Indication for care in labor or delivery   Vaginal delivery  Additional problems: none    Discharge diagnosis: Term Pregnancy Delivered                                              Post partum procedures:none Augmentation: AROM, Pitocin and Cytotec Complications: None  Hospital course: Induction of Labor With Vaginal Delivery   25 y.o. yo G2P1001 at [redacted]w[redacted]d was admitted to the hospital 02/05/2021 for induction of labor.  Indication for induction: Elective.  Patient had an uncomplicated labor course as follows: Membrane Rupture Time/Date: 3:30 PM ,02/05/2021   Delivery Method:Vaginal, Spontaneous  Episiotomy: None  Lacerations:  None  Details of delivery can be found in separate delivery note.  Patient had a routine postpartum course. She was restarted on PO iron at discharge. Patient is discharged home 02/07/21.  Newborn Data: Birth date:02/05/2021  Birth time:8:43 PM  Gender:Female  Living status:Living  Apgars:9 ,8  Weight:3524 g   Magnesium Sulfate received: No BMZ received: No Rhophylac:N/A MMR:N/A T-DaP:Given prenatally Flu: Yes Transfusion:No  Physical exam  Vitals:   02/06/21 0830 02/06/21 1228 02/06/21 2120 02/07/21 0627  BP: 109/74 106/65 121/74 113/69  Pulse: 76  85 71  Resp: $Remo'16 17 20 20  'xAqXx$ Temp: 98.2 F (36.8 C) 98.7 F (37.1 C) 98.9 F (37.2 C) (!) 97.5 F (36.4 C)  TempSrc: Oral Oral Oral Oral  SpO2: 98% 99%    Weight:      Height:       General: alert, cooperative and no distress Lochia: appropriate Uterine Fundus: firm Incision: N/A DVT  Evaluation: No evidence of DVT seen on physical exam. Labs: Lab Results  Component Value Date   WBC 13.0 (H) 02/06/2021   HGB 9.1 (L) 02/06/2021   HCT 29.7 (L) 02/06/2021   MCV 83.0 02/06/2021   PLT 221 02/06/2021   CMP Latest Ref Rng & Units 02/14/2017  Glucose 65 - 99 mg/dL 84  BUN 6 - 20 mg/dL 8  Creatinine 0.44 - 1.00 mg/dL 0.67  Sodium 135 - 145 mmol/L 140  Potassium 3.5 - 5.1 mmol/L 4.2  Chloride 101 - 111 mmol/L 106  CO2 22 - 32 mmol/L 25  Calcium 8.9 - 10.3 mg/dL 9.9  Total Protein 6.0 - 8.3 g/dL -  Total Bilirubin 0.3 - 1.2 mg/dL -  Alkaline Phos 47 - 119 U/L -  AST 0 - 37 U/L -  ALT 0 - 35 U/L -   Edinburgh Score: Edinburgh Postnatal Depression Scale Screening Tool 02/06/2021  I have been able to laugh and see the funny side of things. 1  I have looked forward with enjoyment to things. 1  I have blamed myself unnecessarily when things went wrong. 2  I have been anxious or worried for no good reason. 3  I have felt scared or panicky for no good reason. 0  Things have been getting on top of me. 1  I have been  so unhappy that I have had difficulty sleeping. 0  I have felt sad or miserable. 2  I have been so unhappy that I have been crying. 2  The thought of harming myself has occurred to me. 1  Edinburgh Postnatal Depression Scale Total 13     After visit meds:  Allergies as of 02/07/2021      Reactions   Latex Rash   Shellfish Allergy Rash      Medication List    STOP taking these medications   Blood Pressure Monitor [place] Supp Sm Misc   [place]ji Weight Scale Misc     TAKE these medications   calcium carbonate 500 MG chewable tablet Commonly known as: TUMS - dosed in mg elemental calcium Chew 1 tablet by mouth 3 (three) times daily as needed for indigestion or heartburn.   coconut oil Oil Apply 1 application topically as needed.   ferrous sulfate 325 (65 FE) MG tablet Take 1 tablet (325 mg total) by mouth daily with  breakfast.   ibuprofen 600 MG tablet Commonly known as: ADVIL Take 1 tablet (600 mg total) by mouth every 6 (six) hours.   prenatal multivitamin Tabs tablet Take 1 tablet by mouth daily at 12 noon.        Discharge home in stable condition Infant Feeding: Bottle Infant Disposition:home with mother Discharge instruction: per After Visit Summary and Postpartum booklet. Activity: Advance as tolerated. Pelvic rest for 6 weeks.  Diet: routine diet Future Appointments: Future Appointments  Date Time Provider [place]  03/22/2021  9:30 AM Nugent, Gerrie Nordmann, NP [facility] None   Follow up Visit:   Please schedule this patient for a In person postpartum visit in 6 weeks with the following provider: Any provider. Additional Postpartum F/U:Postpartum Depression checkup  Low risk pregnancy complicated by: hx depression Delivery mode:  Vaginal, Spontaneous  Anticipated Birth Control:  undecided, would prefer to discuss further at postpartum visit    02/07/2021 Janet Berlin, MD

## 2021-02-05 NOTE — Anesthesia Preprocedure Evaluation (Signed)
Anesthesia Evaluation  Patient identified by MRN, date of birth, ID band Patient awake    Reviewed: Allergy & Precautions, NPO status , Patient's Chart, lab work & pertinent test results  Airway Mallampati: II  TM Distance: >3 FB Neck ROM: Full    Dental no notable dental hx.    Pulmonary neg pulmonary ROS, former smoker,    Pulmonary exam normal breath sounds clear to auscultation       Cardiovascular negative cardio ROS Normal cardiovascular exam Rhythm:Regular Rate:Normal     Neuro/Psych PSYCHIATRIC DISORDERS Anxiety Depression Bipolar Disorder negative neurological ROS     GI/Hepatic negative GI ROS, Neg liver ROS,   Endo/Other  negative endocrine ROS  Renal/GU negative Renal ROS  negative genitourinary   Musculoskeletal negative musculoskeletal ROS (+)   Abdominal   Peds  Hematology  (+) Blood dyscrasia (Hgb 8.3), anemia ,   Anesthesia Other Findings   Reproductive/Obstetrics (+) Pregnancy                             Anesthesia Physical Anesthesia Plan  ASA: II  Anesthesia Plan: Epidural   Post-op Pain Management:    Induction:   PONV Risk Score and Plan: Treatment may vary due to age or medical condition  Airway Management Planned: Natural Airway  Additional Equipment:   Intra-op Plan:   Post-operative Plan:   Informed Consent: I have reviewed the patients History and Physical, chart, labs and discussed the procedure including the risks, benefits and alternatives for the proposed anesthesia with the patient or authorized representative who has indicated his/her understanding and acceptance.       Plan Discussed with: Anesthesiologist  Anesthesia Plan Comments: (Patient identified. Risks, benefits, options discussed with patient including but not limited to bleeding, infection, nerve damage, paralysis, failed block, incomplete pain control, headache, blood pressure  changes, nausea, vomiting, reactions to medication, itching, and post partum back pain. Confirmed with bedside nurse the patient's most recent platelet count. Confirmed with the patient that they are not taking any anticoagulation, have any bleeding history or any family history of bleeding disorders. Patient expressed understanding and wishes to proceed. All questions were answered. )        Anesthesia Quick Evaluation

## 2021-02-05 NOTE — Anesthesia Procedure Notes (Signed)
Epidural Patient location during procedure: OB Start time: 02/05/2021 12:40 PM End time: 02/05/2021 12:50 PM  Staffing Anesthesiologist: Elmer Picker, MD Performed: anesthesiologist   Preanesthetic Checklist Completed: patient identified, IV checked, risks and benefits discussed, monitors and equipment checked, pre-op evaluation and timeout performed  Epidural Patient position: sitting Prep: DuraPrep and site prepped and draped Patient monitoring: continuous pulse ox, blood pressure, heart rate and cardiac monitor Approach: midline Location: L3-L4 Injection technique: LOR air  Needle:  Needle type: Tuohy  Needle gauge: 17 G Needle length: 9 cm Needle insertion depth: 4 cm Catheter type: closed end flexible Catheter size: 19 Gauge Catheter at skin depth: 10 cm Test dose: negative  Assessment Sensory level: T8 Events: blood not aspirated, injection not painful, no injection resistance, no paresthesia and negative IV test  Additional Notes Patient identified. Risks/Benefits/Options discussed with patient including but not limited to bleeding, infection, nerve damage, paralysis, failed block, incomplete pain control, headache, blood pressure changes, nausea, vomiting, reactions to medication both or allergic, itching and postpartum back pain. Confirmed with bedside nurse the patient's most recent platelet count. Confirmed with patient that they are not currently taking any anticoagulation, have any bleeding history or any family history of bleeding disorders. Patient expressed understanding and wished to proceed. All questions were answered. Sterile technique was used throughout the entire procedure. Please see nursing notes for vital signs. Test dose was given through epidural catheter and negative prior to continuing to dose epidural or start infusion. Warning signs of high block given to the patient including shortness of breath, tingling/numbness in hands, complete motor block,  or any concerning symptoms with instructions to call for help. Patient was given instructions on fall risk and not to get out of bed. All questions and concerns addressed with instructions to call with any issues or inadequate analgesia.  Reason for block:procedure for pain

## 2021-02-05 NOTE — H&P (Signed)
OBSTETRIC ADMISSION HISTORY AND PHYSICAL  Nicole Harris is a 25 y.o. female G2P1001 with IUP at [redacted]w[redacted]d by 6 week ultrasound presenting for elective IOL. She reports +FMs, No LOF, no VB, no blurry vision, headaches or peripheral edema, and RUQ pain.  She plans on bottle feeding. She is undecided on birth control.  She received her prenatal care at [facility]   Dating: By 6 week ultrasound --->  Estimated Date of Delivery: 02/10/21  Sono:  @[redacted]w[redacted]d , CWD, normal anatomy, cephalic presentation, 4012g, EFW  Prenatal History/Complications:  - h/o depression (no medications) - anemia (Hgb 9.2 on 01/26/21) - h/o vacuum-assisted vaginal delivery (BW 3994g)  Past Medical History: Past Medical History:  Diagnosis Date  . ADHD (attention deficit hyperactivity disorder)   . Anemia   . Anxiety   . Bipolar disorder (HCC)   . Depression   . Encounter for supervision of normal first pregnancy in second trimester 07/10/2018   .09/10/2018 Nursing Staff Provider Office Location  [facility] Dating  U/S 08/14/18 Language   English Anatomy 08/16/18  08/14/18 Normal Flu Vaccine   09/25/18 Genetic Screen  NIPS:   AFP: Negative  First Screen:  Quad:   TDaP vaccine    Hgb A1C or  GTT Early  Third trimester  Rhogam     LAB RESULTS  Feeding Plan Breast Blood Type   O + Contraception  OCP Antibody  Negative Circumcision  Yes Rubella  Immune Pedia  . GBS (group B Streptococcus carrier), +RV culture, currently pregnant 01/08/2019  . Indication for care in labor or delivery 02/01/2019  . Nausea/vomiting in pregnancy 07/10/2018    Past Surgical History: Past Surgical History:  Procedure Laterality Date  . ADENOIDECTOMY    . TONGUE SURGERY    . TONSILLECTOMY      Obstetrical History: OB History    Gravida  2   Para  1   Term  1   Preterm      AB      Living  1     SAB      IAB      Ectopic      Multiple  0   Live Births  1           Social History Social History   Socioeconomic History  . Marital  status: Significant Other    Spouse name: Not on file  . Number of children: 1  . Years of education: Not on file  . Highest education level: Not on file  Occupational History  . Occupation: unemployed  Tobacco Use  . Smoking status: Former Smoker    Types: Cigarettes  . Smokeless tobacco: Never Used  Vaping Use  . Vaping Use: Never used  Substance and Sexual Activity  . Alcohol use: Not Currently    Comment: occasionally  . Drug use: No  . Sexual activity: Not Currently    Birth control/protection: None  Other Topics Concern  . Not on file  Social History Narrative  . Not on file   Social Determinants of Health   Financial Resource Strain: Not on file  Food Insecurity: Not on file  Transportation Needs: Not on file  Physical Activity: Not on file  Stress: Not on file  Social Connections: Not on file    Family History: Family History  Problem Relation Age of Onset  . Diabetes Paternal Grandmother   . Drug abuse Mother   . Cancer Maternal Grandmother   . Thyroid disease Maternal Grandmother   .  Ovarian cancer Maternal Grandmother   . Hypertension Paternal Grandfather     Allergies: Allergies  Allergen Reactions  . Latex Rash  . Shellfish Allergy Rash    Medications Prior to Admission  Medication Sig Dispense Refill Last Dose  . ferrous sulfate 325 (65 FE) MG tablet Take 1 tablet (325 mg total) by mouth daily with breakfast. 30 tablet 11 02/04/2021 at Unknown time  . Prenatal Vit-Fe Phos-FA-Omega (VITAFOL GUMMIES) 3.33-0.333-34.8 MG CHEW Chew 3 Pieces by mouth daily. 90 tablet 12 02/04/2021 at Unknown time  . Blood Pressure Monitoring (BLOOD PRESSURE MONITOR AUTOMAT) DEVI 1 Device by Does not apply route daily. Automatic blood pressure cuff regular size. To monitor blood pressure regularly at home. ICD-10 code:Z34.90 1 each 0   . cyclobenzaprine (FLEXERIL) 10 MG tablet Take 1 tablet (10 mg total) by mouth 2 (two) times daily as needed for muscle spasms. (Patient not  taking: Reported on 02/02/2021) 14 tablet 0   . Elastic Bandages & Supports (COMFORT FIT MATERNITY SUPP SM) MISC 1 Units by Does not apply route daily as needed. 1 each 0   . Misc. Devices (GOJJI WEIGHT SCALE) MISC 1 Device by Does not apply route daily as needed. To weight self daily as needed at home. ICD-10 code: Z34.90 1 each 0   . terconazole (TERAZOL 7) 0.4 % vaginal cream Place 1 applicator vaginally at bedtime. For 3 nights. (Patient not taking: Reported on 02/02/2021) 45 g 0      Review of Systems   All systems reviewed and negative except as stated in HPI  Blood pressure 94/64, pulse (!) 131, temperature 98 F (36.7 C), temperature source Oral, resp. rate 18, height 5\' 4"  (1.626 m), weight 73.9 kg, last menstrual period 04/28/2020, not currently breastfeeding. General appearance: alert, cooperative and appears stated age Lungs: normal WOB Heart: regular rate Abdomen: soft, non-tender Extremities: no sign of DVT Presentation: cephalic Fetal monitoringBaseline: 120 bpm, Variability: Good {> 6 bpm), Accelerations: Reactive and Decelerations: Absent Uterine activityFrequency: irregular    Prenatal labs: ABO, Rh: O/Positive/-- (08/24 1454) Antibody: Negative (08/24 1454) Rubella: 2.01 (08/24 1454) RPR: Non Reactive (11/17 0852)  HBsAg: Negative (08/24 1454)  HIV: Non Reactive (11/17 0852)  GBS: Negative/-- (01/27 1121)  1 hr Glucola wnl Genetic screening wnl Anatomy 08-09-2002 wnl  Prenatal Transfer Tool  Maternal Diabetes: No Genetic Screening: Normal Maternal Ultrasounds/Referrals: Normal Fetal Ultrasounds or other Referrals:  None Maternal Substance Abuse:  No Significant Maternal Medications:  None Significant Maternal Lab Results: Group B Strep negative  No results found for this or any previous visit (from the past 24 hour(s)).  Patient Active Problem List   Diagnosis Date Noted  . Indication for care in labor or delivery 02/05/2021  . History of depression 10/19/2020   . Hx of delivery by vacuum extraction, currently pregnant 09/14/2020  . Supervision of other normal pregnancy, antepartum 08/23/2020  . Anemia in pregnancy 11/04/2018    Assessment/Plan:  Nicole Harris is a 25 y.o. G2P1001 at [redacted]w[redacted]d here for elective IOL.  #Labor: Will plan for cervical ripening with cytotec and FB given initial cervical exam. Will transition to pitocin as clinically indicated. #Pain:TBD per pt request- #FWB: Category 1 strip #ID: GBS negative #MOF: bottle #MOC: undecided s/p counseling #Circ: n/a #H/o Depression: no current meds or safety issues. Plan for 1 week mood check #H/o vacuum-assisted vaginal delivery: counseled on shoulder dystocia precautions on admission. #H/o Anemia: po iron in pregnancy. F/u CBC on admission.  [redacted]w[redacted]d,  MD  02/05/2021, 8:38 AM

## 2021-02-05 NOTE — Progress Notes (Signed)
Labor Progress Note Nicole Harris is a 25 y.o. G2P1001 at [redacted]w[redacted]d presented for elective IOL.  S: Pt reporting intermittent vaginal pressure with contractions. No other concerns at this time.  O:  BP 110/61   Pulse 77   Temp 98.1 F (36.7 C) (Axillary)   Resp 16   Ht 5\' 4"  (1.626 m)   Wt 73.9 kg   LMP 04/28/2020   SpO2 100%   BMI 27.98 kg/m  EFM: baseline 120/moderate variability/+accels/intermittent early decels Toco: ctx q1-3 min  CVE: Dilation: Lip/rim Effacement (%): 100 Station: 0 Presentation: Vertex Exam by:: Dr. [email or web address]   A&P: 25 y.o. G2P1001 [redacted]w[redacted]d presented for elective IOL. #Labor: Progressing well with up-titration of pitocin s/p SROM at 1530. Will plan to recheck cervical exam as clinically indicated. #Pain: epidural in place #FWB: Category 1 strip #GBS negative #Depression: no current meds or safety issues. Plan for 1 week mood check #H/o vacuum-assisted vaginal delivery: counseled on shoulder dystocia precautions on admission. #Anemia: po iron in pregnancy. Hgb 8.3 on admission. Will plan for IV iron vs pRBCs pending EBL at time of delivery. Low threshold for TXA. Blood products consent form signed.  [redacted]w[redacted]d, MD 5:52 PM

## 2021-02-05 NOTE — Progress Notes (Signed)
Labor Progress Note Nicole Harris is a 25 y.o. G2P1001 at [redacted]w[redacted]d presented for elective IOL.  S: Strip note. No concerns per nursing. Pt now resting comfortably with epidural in place.  O:  BP 110/62   Pulse (!) 103   Temp 98.1 F (36.7 C) (Axillary)   Resp 18   Ht 5\' 4"  (1.626 m)   Wt 73.9 kg   LMP 04/28/2020   SpO2 100%   BMI 27.98 kg/m  EFM: baseline 125/moderate variability/+accels/no decels  CVE: Dilation: 5 Effacement (%): 50 Station: -2 Presentation: Vertex Exam by:: [email or web address] RN   A&P: 25 y.o. G2P1001 [redacted]w[redacted]d presented for elective IOL. #Labor: Cytotec placed on admission. Given fetal intolerance with recurrent late decels and need for terbutaline, FB was placed and pt was transitioned to pitocin at 1324. Will continue to up-titrate pitocin as clinically indicated. #Pain: epidural in place #FWB: Category 1 strip #GBS negative #Depression: no current meds or safety issues. Plan for 1 week mood check #H/o vacuum-assisted vaginal delivery: counseled on shoulder dystocia precautions on admission. #Anemia: po iron in pregnancy. Hgb 8.3 on admission. Will plan for IV iron vs pRBCs pending EBL at time of delivery.  [redacted]w[redacted]d, MD 2:22 PM

## 2021-02-06 ENCOUNTER — Encounter: Payer: Self-pay | Admitting: *Deleted

## 2021-02-06 LAB — CBC
HCT: 29.7 % — ABNORMAL LOW (ref 36.0–46.0)
Hemoglobin: 9.1 g/dL — ABNORMAL LOW (ref 12.0–15.0)
MCH: 25.4 pg — ABNORMAL LOW (ref 26.0–34.0)
MCHC: 30.6 g/dL (ref 30.0–36.0)
MCV: 83 fL (ref 80.0–100.0)
Platelets: 221 10*3/uL (ref 150–400)
RBC: 3.58 MIL/uL — ABNORMAL LOW (ref 3.87–5.11)
RDW: 14.7 % (ref 11.5–15.5)
WBC: 13 10*3/uL — ABNORMAL HIGH (ref 4.0–10.5)
nRBC: 0 % (ref 0.0–0.2)

## 2021-02-06 MED ORDER — MAGNESIUM OXIDE 400 (241.3 MG) MG PO TABS
400.0000 mg | ORAL_TABLET | Freq: Every day | ORAL | Status: DC
  Administered 2021-02-06: 400 mg via ORAL
  Filled 2021-02-06: qty 1

## 2021-02-06 MED ORDER — FERROUS SULFATE 325 (65 FE) MG PO TABS
325.0000 mg | ORAL_TABLET | Freq: Every day | ORAL | Status: DC
  Administered 2021-02-07: 325 mg via ORAL
  Filled 2021-02-06 (×2): qty 1

## 2021-02-06 NOTE — Progress Notes (Addendum)
POSTPARTUM PROGRESS NOTE  Subjective: Nicole Harris is a 25 y.o. [id number] s/p VD at [redacted]w[redacted]d.  She reports she doing well. No acute events overnight. She denies any problems with ambulating, voiding or po intake. Denies nausea or vomiting. She has passed flatus. Pain is moderately controlled.  Lochia is minimal.  Objective: Blood pressure 118/80, pulse 73, temperature 97.8 F (36.6 C), temperature source Oral, resp. rate 17, height 5\' 4"  (1.626 m), weight 73.9 kg, last menstrual period 04/28/2020, SpO2 100 %, unknown if currently breastfeeding.  Physical Exam:  General: alert, cooperative and no distress Chest: no respiratory distress Abdomen: soft, non-tender  Uterine Fundus: firm and at level of umbilicus Extremities: No calf swelling or tenderness  no edema  Recent Labs    02/05/21 0936 02/06/21 0444  HGB 8.3* 9.1*  HCT 26.2* 29.7*    Assessment/Plan: Nicole Harris is a 25 y.o. 21 s/p VD at [redacted]w[redacted]d for eIOL.  Routine Postpartum Care: Doing well, pain well-controlled.  -- Continue routine care, lactation support  -- Contraception: none at this time, waiting for OP visit  -- Feeding: bottle  #Anemia: Hgb 9.1, up from 8.3 on admission. Can consider PO iron  #Depression: no current meds or safety issues. Plan for 1 week mood check  Dispo: Plan for discharge 02/07/21.  04/07/21, DO OB Fellow, Faculty Practice 02/06/2021 8:19 AM  Attestation of Supervision of Student:  I confirm that I have verified the information documented in the  resident's  student's note and that I have also personally reperformed the history, physical exam and all medical decision making activities.  I have verified that all services and findings are accurately documented in this student's note; and I agree with management and plan as outlined in the documentation. I have also made any necessary editorial changes.  -started patient on PO iron  04/06/2021, CNM [facility], [facility] 02/06/2021 9:09 AM

## 2021-02-06 NOTE — Anesthesia Postprocedure Evaluation (Signed)
Anesthesia Post Note  Patient: Nicole Harris  Procedure(s) Performed: AN AD HOC LABOR EPIDURAL     Patient location during evaluation: Mother Baby Anesthesia Type: Epidural Level of consciousness: awake and alert and oriented Pain management: satisfactory to patient Vital Signs Assessment: post-procedure vital signs reviewed and stable Respiratory status: respiratory function stable Cardiovascular status: stable Postop Assessment: no headache, no backache, epidural receding, patient able to bend at knees, no signs of nausea or vomiting, adequate PO intake and able to ambulate Anesthetic complications: no   No complications documented.  Last Vitals:  Vitals:   02/06/21 0030 02/06/21 0445  BP: 114/77 118/80  Pulse: 96 73  Resp: 18 17  Temp: 36.7 C 36.6 C  SpO2: 98% 100%    Last Pain:  Vitals:   02/06/21 0445  TempSrc: Oral  PainSc: 3    Pain Goal:                   [name]

## 2021-02-06 NOTE — Clinical Social Work Maternal (Signed)
CLINICAL SOCIAL WORK MATERNAL/CHILD NOTE  Patient Details  Name: Nicole Harris MRN: [id number] Date of Birth: [birth year]  Date:  02/06/2021  Clinical Social Worker Initiating Note:  Hortencia Pilar, LCSW Date/Time: Initiated:  02/06/21/0930     Child's Name:  Nicole Harris   Biological Parents:  Mother,Father (Nicole Harris, Nicole Harris)   Need for Interpreter:  None   Reason for Referral:  Behavioral Health Concerns   Address:  [address] [place] [address]    Phone number:  [phone] (home)     Additional phone number: none  Household Members/Support Persons (HM/SP):   Household Member/Support Person 1,Household Member/Support Person 2   HM/SP Name Relationship DOB or Age  HM/SP -1  Nicole Harris MOB [birth year] 1998  HM/SP -2 Nicole Harris FOB 07/16/1997  HM/SP -3 Nicole Harris son 02/02/2019  HM/SP -4        HM/SP -5        HM/SP -6        HM/SP -7        HM/SP -8          Natural Supports (not living in the home):  Parent   Professional Supports: None   Employment: Unemployed   Type of Work: MOB reports that she is starting job at [employer]   Education:  High school graduate   Homebound arranged:  n/a  Financial Resources:  Medicaid   Other Resources:  Sales executive ,[facility]   Cultural/Religious Considerations Which May Impact Care:  none reported.  Strengths:  Ability to meet basic needs ,Compliance with medical plan ,Home prepared for child ,Psychotropic Medications,Pediatrician chosen   Psychotropic Medications:      MOB reported that she was on medications in the past, however expressed no current medication use.    Pediatrician:    [place]  Pediatrician List:   [facility]  [facility]      Pediatrician Fax Number:    Risk Factors/Current Problems:  None   Cognitive State:  Able to Concentrate ,Alert  ,Insightful    Mood/Affect:  Comfortable ,Calm ,Relaxed ,Interested ,Happy    CSW Assessment: CSW consulted due to MOB reporting a hx of depression and Bipolar. CSW went to speak with MOB at bedside to address further needs.   CSW congratulated MOB and FOB on the birth of infant. CSW advised MOB of HIPPA policy in which MOB was agreeable to FOB leaving room. CSW then advised MOB of CSW's role and the reason for CSW coming to speak with her. MOB expressed that she does have a hx of Bipolar. MOB expressed being diagnosed at the age of "7-55 years old". MOB reported that she was on medication in the past but stopped taking medications "aroud 17". CSW understand of this and inquired from [facility] on therapy to help with Bipolar. MOB expressed that she is not involved in therapy and declined having any hx of therapy. CSW was advised that MOB  Also has a hx of depression. MOB indicated that she also has a hx of depression. MOB reported to CSW that during this pregnancy, she had depression where MOB expressed periods of it "coming and going". MOB indicated that she copes with her depression by caring for her children. 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 CSW took this time to provide MOB with PPD education. MOB was given PPD Checklist in order to keep track of her feelings as they may relate to PPD. MOB expressed that she has never dealt with PPD with her last child but reported occasions in this pregnancy where MOB "just felt depressed". CSW understanding and encouraged MOB to remain vigilant of these feelings as they can develop into severe PPD. MOB expressed that she understood and thanked CSW. MOB was also given SIDS education by CSW.  CSW inquired from [facility] on who he supports are in which MOB identified her boyfriend/FOB. MOB expressed that he is very supportive and that "he is really the sweetest". CSW advised  MOB to allow supports to assist with children as needed to minimize feelings of increased depression. MOB expressed that she also has support from her parents during this time. MOB reported that she has all needed items to care for infant with plans for infant to sleep in basinet once arrived home. MOB reported that she gets [facility] and [employer] as well. MOB reported no other needs this CSW at this time.   CSW noted that MOB scored 13 on [place]. CSW spoke with MOB about this and was advised that MOB "just found myself in a bad spot".  MOB reported that she was forced to quit her job due to back pain related to pregnancy. MOB expressed that since she has given birth, she has been calm and happy. CSW identifies no barriers to discharge at this time.   CSW Plan/Description:  No Further Intervention Required/No Barriers to Discharge,Sudden Infant Death Syndrome (SIDS) Education,Perinatal Mood and Anxiety Disorder (PMADs) Education    Robb Matar, LCSWA 02/06/2021, 10:06 AM

## 2021-02-06 NOTE — Lactation Note (Signed)
This note was copied from a baby's chart. Lactation Consultation Note  Patient Name: Nicole Harris [id number] Date: 02/06/2021 Reason for consult: Initial assessment;Term Age:23 hours   Lactation Consult ordered entered at 0942 this morning.  P2 mother whose infant is now 23 hours old.  This is a term baby at 39+2 weeks.  Mother breast fed her first child (now 9 years old) for 8 months.  Mother's feeding preference on admission was to bottle feed due to mother returning to work.  However, she stated that her baby "will not take the bottle" but breast feeds well so she is now breast feeding.  Baby was asleep STS on mother's chest when I arrived.  Mother will be returning to her job at a [employer] and will have no place to pump.  This was a factor in her decision to originally formula feed.  Mother is trying to get a "work from home" job and if she is successful then she will continue to breast feed.  Praised mother for her efforts and willingness to breast feed.  Encouraged to feed 8-12 times/24 hours or sooner if baby shows feeding cues.  Mother is familiar with hand expression.  Colostrum container provided and milk storage times reviewed.  Finger feeding demonstrated.  Mother stated that her daughter has latched easily since birth.  Mother will call as needed for assistance.  Mother does not have a DEBP for home use.  However, she is a [facility] participant and is already planning on accepting the formula package.  Father present.   Maternal Data Has patient been taught Hand Expression?: Yes Does the patient have breastfeeding experience prior to this delivery?: Yes How long did the patient breastfeed?: 8 months  Feeding Mother's Current Feeding Choice: Breast Milk  LATCH Score Latch: Grasps breast easily, tongue down, lips flanged, rhythmical sucking.  Audible Swallowing: A few with stimulation  Type of Nipple: Everted at rest and after stimulation  Comfort  (Breast/Nipple): Soft / non-tender  Hold (Positioning): No assistance needed to correctly position infant at breast.  LATCH Score: 9   Lactation Tools Discussed/Used    Interventions    Discharge    Consult Status Consult Status: Follow-up Date: 02/07/21 Follow-up type: In-patient    Shaden Higley R Kamani Magnussen 02/06/2021, 10:18 AM

## 2021-02-07 MED ORDER — COCONUT OIL OIL: 1.0000 "application " | TOPICAL_OIL | 0 refills | Status: DC | PRN

## 2021-02-07 MED ORDER — IBUPROFEN 600 MG PO TABS: 600.0000 mg | ORAL_TABLET | Freq: Four times a day (QID) | ORAL | 0 refills | Status: DC

## 2021-02-07 MED ORDER — PRENATAL MULTIVITAMIN CH: 1.0000 | ORAL_TABLET | Freq: Every day | ORAL | Status: DC

## 2021-02-07 NOTE — Discharge Instructions (Signed)

## 2021-02-07 NOTE — Lactation Note (Signed)
This note was copied from a baby's chart. Lactation Consultation Note  Patient Name: Girl Benay Pomeroy [id number] Date: 02/07/2021 Reason for consult: Follow-up assessment Age:23 hours  Mom and baby to d/c today. LC to room for d/c teaching. Infant with adequate voids/stools. Weight loss wnl.  Mom has +breast changes today without engorgement. LC discouraged continued bottle / pacifier use unless medically indicated to reduce risk of engorgement. Reviewed feeding / output expectations. Mom aware of reasons to f/u with Ped earlier than the scheduled appointment on Thursday. Patient was provided with the opportunity to ask questions. All concerns were addressed.    Feeding Mother's Current Feeding Choice: Breast Milk and Formula Nipple Type: Regular   Discharge Discharge Education: Engorgement and breast care;Warning signs for feeding baby;Outpatient recommendation  Consult Status Consult Status: Complete Follow-up type: In-patient   Elder Negus, MA IBCLC 02/07/2021, 9:17 AM

## 2021-02-08 ENCOUNTER — Other Ambulatory Visit: Payer: Self-pay | Admitting: *Deleted

## 2021-02-08 MED ORDER — IBUPROFEN 600 MG PO TABS: 600.0000 mg | ORAL_TABLET | Freq: Four times a day (QID) | ORAL | 1 refills | Status: DC | PRN

## 2021-02-08 MED ORDER — IBUPROFEN 600 MG PO TABS: 600.0000 mg | ORAL_TABLET | Freq: Four times a day (QID) | ORAL | 0 refills | Status: DC

## 2021-02-08 MED ORDER — PRENATAL MULTIVITAMIN CH: 1.0000 | ORAL_TABLET | Freq: Every day | ORAL | Status: DC

## 2021-02-15 ENCOUNTER — Ambulatory Visit: Payer: Medicaid Other | Admitting: Obstetrics and Gynecology

## 2021-02-16 ENCOUNTER — Other Ambulatory Visit: Payer: Self-pay

## 2021-02-16 ENCOUNTER — Ambulatory Visit (INDEPENDENT_AMBULATORY_CARE_PROVIDER_SITE_OTHER): Payer: Medicaid Other | Admitting: Obstetrics and Gynecology

## 2021-02-16 ENCOUNTER — Encounter: Payer: Self-pay | Admitting: Obstetrics and Gynecology

## 2021-02-16 VITALS — BP 120/75 | HR 96 | Temp 97.6°F | Ht 64.0 in | Wt 143.4 lb

## 2021-02-16 DIAGNOSIS — N393 Stress incontinence (female) (male): Secondary | ICD-10-CM | POA: Diagnosis not present

## 2021-02-16 DIAGNOSIS — M6289 Other specified disorders of muscle: Secondary | ICD-10-CM | POA: Diagnosis not present

## 2021-02-16 HISTORY — DX: Stress incontinence (female) (male): N39.3

## 2021-02-16 MED ORDER — DULOXETINE HCL 20 MG PO CPEP: 20.0000 mg | ORAL_CAPSULE | Freq: Two times a day (BID) | ORAL | 3 refills | Status: DC

## 2021-02-16 NOTE — Progress Notes (Signed)
  POSTPARTUM PROBLEM VISIT PROGRESS NOTE  History:  [name] is a 25 y.o. [id number] presents to [facility] office today for problem gyn visit. She reports incontinence.  She denies h/a, dizziness, shortness of breath, n/v, or fever/chills.    The following portions of the patient's history were reviewed and updated as appropriate: allergies, current medications, past family history, past medical history, past social history, past surgical history and problem list.   Review of Systems:  Pertinent items are noted in HPI.   Objective:  Physical Exam Blood pressure 120/75, pulse 96, temperature 97.6 F (36.4 C), temperature source Oral, height 5\' 4"  (1.626 m), weight 143 lb 6.4 oz (65 kg), last menstrual period 04/28/2020, currently breastfeeding. VS reviewed, nursing note reviewed,  Constitutional: well developed, well nourished, no distress HEENT: normocephalic CV: normal rate Pulm/chest wall: normal effort Breast Exam: deferred Abdomen: soft Neuro: alert and oriented x 3 Skin: warm, dry Psych: affect normal Pelvic exam: deferred Bimanual exam: Cervix 0/long/high, firm, anterior, neg CMT, uterus nontender, nonenlarged, adnexa without tenderness, enlargement, or mass -- no pelvic floor tone during physical exam   Assessment & Plan:  1. Pelvic floor dysfunction in female - Consult with Dr. 04/30/2020 @ 1435 - notified of patient's complaints --> recommends referral to PT and Rx anti-cholinergic - AMB referral to pelvic floor rehabilitation - Per Up-to-Date Rx sent for Cymbalta 40 mg BID for stress incontinence - Information provided on urinary incontinence and pelvic flood dysfunction    Nicole Harris, CNM 3:26 PM

## 2021-02-16 NOTE — Patient Instructions (Addendum)
[facility]  [phone].   Pelvic Floor Dysfunction  Pelvic floor dysfunction (PFD) is a condition that results when the group of muscles and connective tissues that support the organs in the pelvis (pelvic floor muscles) do not work well. These muscles and their connections form a sling that supports the colon and bladder. In men, these muscles also support the prostate gland. In women, they also support the uterus. PFD causes pelvic floor muscles to be too weak, too tight, or a combination of both. In PFD, muscle movements are not coordinated. This condition may cause bowel or bladder problems. It may also cause pain. What are the causes? This condition may be caused by an injury to the pelvic area or by a weakening of pelvic muscles. This often results from pregnancy and childbirth or other types of strain. In many cases, the exact cause is not known. What increases the risk? The following factors may make you more likely to develop this condition:  Having a condition of chronic bladder tissue inflammation (interstitial cystitis).  Being an older person.  Being overweight.  Radiation treatment for cancer in the pelvic region.  Previous pelvic surgery, such as removal of the uterus (hysterectomy) or prostate gland (prostatectomy). What are the signs or symptoms? Symptoms of this condition vary and may include:  Bladder symptoms, such as: ? Trouble starting urination and emptying the bladder. ? Frequent urinary tract infections. ? Leaking urine when coughing, laughing, or exercising (stress incontinence). ? Having to pass urine urgently or frequently. ? Pain when passing urine.  Bowel symptoms, such as: ? Constipation. ? Urgent or frequent bowel movements. ? Incomplete bowel movements. ? Painful bowel movements. ? Leaking stool or gas.  Unexplained genital or rectal pain.  Genital or rectal muscle spasms.  Low back pain. In  women, symptoms of PFD may also include:  A heavy, full, or aching feeling in the vagina.  A bulge that protrudes into the vagina.  Pain during or after sexual intercourse. How is this diagnosed? This condition may be diagnosed based on:  Your symptoms and medical history.  A physical exam. During the exam, your health care provider may check your pelvic muscles for tightness, spasm, pain, or weakness. This may include a rectal exam and a pelvic exam for women. In some cases, you may have diagnostic tests, such as:  Electrical muscle function tests.  Urine flow testing.  X-ray tests of bowel function.  Ultrasound of the pelvic organs. How is this treated? Treatment for this condition depends on your symptoms. Treatment options include:  Physical therapy. This may include Kegel exercises to help relax or strengthen the pelvic floor muscles.  Biofeedback. This type of therapy provides feedback on how tight your pelvic floor muscles are so that you can learn to control them.  Internal or external massage therapy.  A treatment that involves electrical stimulation of the pelvic floor muscles to help control pain (transcutaneous electrical nerve stimulation, or TENS).  Sound wave therapy (ultrasound) to reduce muscle spasms.  Medicines, such as: ? Muscle relaxants. ? Bladder control medicines. Surgery to reconstruct or support pelvic floor muscles may be an option if other treatments do not help. Follow these instructions at home: Activity  Do your usual activities as told by your health care provider. Ask your health care provider if you should modify any activities.  Do pelvic floor strengthening or relaxing exercises at home as told by your physical therapist. Lifestyle  Maintain a healthy weight.  Eat foods that are high in fiber, such as beans, whole grains, and fresh fruits and vegetables.  Limit foods that are high in fat and processed sugars, such as fried or  sweet foods.  Manage stress with relaxation techniques such as yoga or meditation. General instructions  If you have problems with leakage: ? Use absorbable pads or wear padded underwear. ? Wash frequently with mild soap. ? Keep your genital and anal area as clean and dry as possible. ? Ask your health care provider if you should try a barrier cream to prevent skin irritation.  Take warm baths to relieve pelvic muscle tension or spasms.  Take over-the-counter and prescription medicines only as told by your health care provider.  Keep all follow-up visits as told by your health care provider. This is important. Contact a health care provider if you:  Are not improving with home care.  Have signs or symptoms of PFD that get worse at home.  Develop new signs or symptoms at home.  Have signs of a urinary tract infection, such as: ? Fever. ? Chills. ? Urinary frequency. ? A burning feeling when urinating.  Have not had a bowel movement in 3 days (constipation). Summary  Pelvic floor dysfunction results when the muscles and connective tissues in your pelvic floor do not work well.  These muscles and their connections form a sling that supports your colon and bladder. In men, these muscles also support the prostate gland. In women, they also support the uterus.  PFD may be caused by an injury to the pelvic area or by a weakening of pelvic muscles.  PFD causes pelvic floor muscles to be too weak, too tight, or a combination of both. Symptoms may vary from person to person.  In most cases, PFD can be treated with physical therapies and medicines. Surgery may be an option if other treatments do not help. This information is not intended to replace advice given to you by your health care provider. Make sure you discuss any questions you have with your health care provider. Document Revised: 07/07/2018 Document Reviewed: 07/07/2018 Elsevier Patient Education  2021 [employer]. [facility]  [facility] [phone]  Urinary Incontinence  Urinary incontinence refers to a condition in which a person is unable to control where and when to pass urine. A person with this condition will urinate when he or she does not mean to (involuntarily). What are the causes? This condition may be caused by:  Medicines.  Infections.  Constipation.  Overactive bladder muscles.  Weak bladder muscles.  Weak pelvic floor muscles. These muscles provide support for the bladder, intestine, and, in women, the uterus.  Enlarged prostate in men. The prostate is a gland near the bladder. When it gets too big, it can pinch the urethra. With the urethra blocked, the bladder can weaken and lose the ability to empty properly.  Surgery.  Emotional factors, such as anxiety, stress, or post-traumatic stress disorder (PTSD).  Pelvic organ prolapse. This happens in women when organs shift out of place and into the vagina. This shift can prevent the bladder and urethra from working properly. What increases the risk? The following factors may make you more likely to develop this condition:  Older age.  Obesity and physical inactivity.  Pregnancy and childbirth.  Menopause.  Diseases that affect the nerves or spinal cord (neurological diseases).  Long-term (chronic) coughing. This can increase pressure on the bladder and pelvic floor muscles. What are the signs or symptoms?  Symptoms may vary depending on the type of urinary incontinence you have. They include:  A sudden urge to urinate, but passing urine involuntarily before you can get to a bathroom (urge incontinence).  Suddenly passing urine with any activity that forces urine to pass, such as coughing, laughing, exercise, or sneezing (stress incontinence).  Needing to urinate often, but urinating only a small amount, or constantly dribbling urine (overflow incontinence).  Urinating because you cannot  get to the bathroom in time due to a physical disability, such as arthritis or injury, or communication and thinking problems, such as Alzheimer disease (functional incontinence). How is this diagnosed? This condition may be diagnosed based on:  Your medical history.  A physical exam.  Tests, such as: ? Urine tests. ? X-rays of your kidney and bladder. ? Ultrasound. ? CT scan. ? Cystoscopy. In this procedure, a health care provider inserts a tube with a light and camera (cystoscope) through the urethra and into the bladder in order to check for problems. ? Urodynamic testing. These tests assess how well the bladder, urethra, and sphincter can store and release urine. There are different types of urodynamic tests, and they vary depending on what the test is measuring. To help diagnose your condition, your health care provider may recommend that you keep a log of when you urinate and how much you urinate. How is this treated? Treatment for this condition depends on the type of incontinence that you have and its cause. Treatment may include:  Lifestyle changes, such as: ? Quitting smoking. ? Maintaining a healthy weight. ? Staying active. Try to get 150 minutes of moderate-intensity exercise every week. Ask your health care provider which activities are safe for you. ? Eating a healthy diet.  Avoid high-fat foods, like fried foods.  Avoid refined carbohydrates like white bread and white rice.  Limit how much alcohol and caffeine you drink.  Increase your fiber intake. Foods such as fresh fruits, vegetables, beans, and whole grains are healthy sources of fiber.  Pelvic floor muscle exercises.  Bladder training, such as lengthening the amount of time between bathroom breaks, or using the bathroom at regular intervals.  Using techniques to suppress bladder urges. This can include distraction techniques or controlled breathing exercises.  Medicines to relax the bladder muscles and  prevent bladder spasms.  Medicines to help slow or prevent the growth of a man's prostate.  Botox injections. These can help relax the bladder muscles.  Using pulses of electricity to help change bladder reflexes (electrical nerve stimulation).  For women, using a medical device to prevent urine leaks. This is a small, tampon-like, disposable device that is inserted into the urethra.  Injecting collagen or carbon beads (bulking agents) into the urinary sphincter. These can help thicken tissue and close the bladder opening.  Surgery. Follow these instructions at home: Lifestyle  Limit alcohol and caffeine. These can fill your bladder quickly and irritate it.  Keep yourself clean to help prevent odors and skin damage. Ask your doctor about special skin creams and cleansers that can protect the skin from urine.  Consider wearing pads or adult diapers. Make sure to change them regularly, and always change them right after experiencing incontinence. General instructions  Take over-the-counter and prescription medicines only as told by your health care provider.  Use the bathroom about every 3-4 hours, even if you do not feel the need to urinate. Try to empty your bladder completely every time. After urinating, wait a minute. Then try to urinate  again.  Make sure you are in a relaxed position while urinating.  If your incontinence is caused by nerve problems, keep a log of the medicines you take and the times you go to the bathroom.  Keep all follow-up visits as told by your health care provider. This is important. Contact a health care provider if:  You have pain that gets worse.  Your incontinence gets worse. Get help right away if:  You have a fever or chills.  You are unable to urinate.  You have redness in your groin area or down your legs. Summary  Urinary incontinence refers to a condition in which a person is unable to control where and when to pass urine.  This  condition may be caused by medicines, infection, weak bladder muscles, weak pelvic floor muscles, enlargement of the prostate (in men), or surgery.  The following factors increase your risk for developing this condition: older age, obesity, pregnancy and childbirth, menopause, neurological diseases, and chronic coughing.  There are several types of urinary incontinence. They include urge incontinence, stress incontinence, overflow incontinence, and functional incontinence.  This condition is usually treated first with lifestyle and behavioral changes, such as quitting smoking, eating a healthier diet, and doing regular pelvic floor exercises. Other treatment options include medicines, bulking agents, medical devices, electrical nerve stimulation, or surgery. This information is not intended to replace advice given to you by your health care provider. Make sure you discuss any questions you have with your health care provider. Document Revised: 12/27/2017 Document Reviewed: 03/28/2017 Elsevier Patient Education  2021 [employer].

## 2021-03-22 ENCOUNTER — Ambulatory Visit: Payer: Medicaid Other | Admitting: Women's Health

## 2021-03-22 ENCOUNTER — Encounter: Payer: Self-pay | Admitting: Women's Health

## 2021-04-04 ENCOUNTER — Ambulatory Visit: Payer: Medicaid Other | Attending: Physical Therapy | Admitting: Physical Therapy

## 2021-10-22 ENCOUNTER — Inpatient Hospital Stay (HOSPITAL_COMMUNITY)
Admission: AD | Admit: 2021-10-22 | Discharge: 2021-10-22 | Disposition: A | Discharge status: Home / Self Care | Payer: Medicaid Other | Attending: Obstetrics & Gynecology | Admitting: Obstetrics & Gynecology

## 2021-10-22 ENCOUNTER — Encounter (HOSPITAL_COMMUNITY): Payer: Self-pay | Admitting: Obstetrics & Gynecology

## 2021-10-22 ENCOUNTER — Inpatient Hospital Stay (HOSPITAL_COMMUNITY): Payer: Medicaid Other

## 2021-10-22 ENCOUNTER — Other Ambulatory Visit: Payer: Self-pay

## 2021-10-22 DIAGNOSIS — O210 Mild hyperemesis gravidarum: Secondary | ICD-10-CM | POA: Insufficient documentation

## 2021-10-22 DIAGNOSIS — O219 Vomiting of pregnancy, unspecified: Secondary | ICD-10-CM

## 2021-10-22 DIAGNOSIS — Z3A01 Less than 8 weeks gestation of pregnancy: Secondary | ICD-10-CM | POA: Diagnosis not present

## 2021-10-22 DIAGNOSIS — O26891 Other specified pregnancy related conditions, first trimester: Secondary | ICD-10-CM | POA: Diagnosis not present

## 2021-10-22 DIAGNOSIS — R109 Unspecified abdominal pain: Secondary | ICD-10-CM | POA: Insufficient documentation

## 2021-10-22 DIAGNOSIS — Z87891 Personal history of nicotine dependence: Secondary | ICD-10-CM | POA: Diagnosis not present

## 2021-10-22 DIAGNOSIS — Z3491 Encounter for supervision of normal pregnancy, unspecified, first trimester: Secondary | ICD-10-CM

## 2021-10-22 LAB — URINALYSIS, ROUTINE W REFLEX MICROSCOPIC
Bilirubin Urine: NEGATIVE
Glucose, UA: NEGATIVE mg/dL
Hgb urine dipstick: NEGATIVE
Ketones, ur: 80 mg/dL — AB
Leukocytes,Ua: NEGATIVE
Nitrite: NEGATIVE
Protein, ur: NEGATIVE mg/dL
Specific Gravity, Urine: 1.026 (ref 1.005–1.030)
pH: 5 (ref 5.0–8.0)

## 2021-10-22 LAB — CBC
HCT: 41.7 % (ref 36.0–46.0)
Hemoglobin: 14 g/dL (ref 12.0–15.0)
MCH: 28.5 pg (ref 26.0–34.0)
MCHC: 33.6 g/dL (ref 30.0–36.0)
MCV: 84.9 fL (ref 80.0–100.0)
Platelets: 292 10*3/uL (ref 150–400)
RBC: 4.91 MIL/uL (ref 3.87–5.11)
RDW: 13.1 % (ref 11.5–15.5)
WBC: 6.1 10*3/uL (ref 4.0–10.5)
nRBC: 0 % (ref 0.0–0.2)

## 2021-10-22 LAB — POCT PREGNANCY, URINE: Preg Test, Ur: POSITIVE — AB

## 2021-10-22 LAB — HCG, QUANTITATIVE, PREGNANCY: hCG, Beta Chain, Quant, S: 52709 m[IU]/mL — ABNORMAL HIGH (ref <5)

## 2021-10-22 MED ORDER — SCOPOLAMINE 1 MG/3DAYS TD PT72
1.0000 | MEDICATED_PATCH | Freq: Once | TRANSDERMAL | Status: DC
  Administered 2021-10-22: 1.5 mg via TRANSDERMAL
  Filled 2021-10-22: qty 1

## 2021-10-22 MED ORDER — FAMOTIDINE IN NACL 20-0.9 MG/50ML-% IV SOLN
20.0000 mg | Freq: Once | INTRAVENOUS | Status: AC
  Administered 2021-10-22: 20 mg via INTRAVENOUS
  Filled 2021-10-22: qty 50

## 2021-10-22 MED ORDER — DEXTROSE 5 % IN LACTATED RINGERS IV BOLUS
1000.0000 mL | Freq: Once | INTRAVENOUS | Status: AC
  Administered 2021-10-22: 1000 mL via INTRAVENOUS

## 2021-10-22 MED ORDER — SODIUM CHLORIDE 0.9 % IV SOLN
25.0000 mg | Freq: Once | INTRAVENOUS | Status: AC
  Administered 2021-10-22: 25 mg via INTRAVENOUS
  Filled 2021-10-22: qty 1

## 2021-10-22 MED ORDER — SCOPOLAMINE 1 MG/3DAYS TD PT72: 1.0000 | MEDICATED_PATCH | TRANSDERMAL | 0 refills | Status: AC

## 2021-10-22 MED ORDER — PROMETHAZINE HCL 25 MG PO TABS: 25.0000 mg | ORAL_TABLET | Freq: Four times a day (QID) | ORAL | 1 refills | Status: DC | PRN

## 2021-10-22 NOTE — MAU Provider Note (Signed)
History     CSN: [id number]  Arrival date and time: 10/22/21 1537   Event Date/Time   First Provider Initiated Contact with Patient 10/22/21 1701      Chief Complaint  Patient presents with   Abdominal Pain   Nausea   Emesis   HPI Nicole Harris is a 25 y.o. G3P2002 at Unknown who presents with nausea/vomiting, and abdominal pain. Symptoms started last week after finding out she was pregnant. States she vomits countless times per day and can only keep down [employer] Does not have antiemetic at home & hasn't tried interventions. Also reports intermittent lower abdominal cramping. Rates pain 1/10 currently. Denies vaginal bleeding, dysuria, fever, or diarrhea.   OB History     Gravida  3   Para  2   Term  2   Preterm      AB      Living  2      SAB      IAB      Ectopic      Multiple  0   Live Births  2           Past Medical History:  Diagnosis Date   ADHD (attention deficit hyperactivity disorder)    Anemia    Anxiety    Bipolar disorder (HCC)    Depression    Encounter for supervision of normal first pregnancy in second trimester 07/10/2018   .Marland Kitchen Nursing Staff Provider Office Location  [facility] Dating  U/S 08/14/18 Language   English Anatomy US  08/14/18 Normal Flu Vaccine   09/25/18 Genetic Screen  NIPS:   AFP: Negative  First Screen:  Quad:   TDaP vaccine    Hgb A1C or  GTT Early  Third trimester  Rhogam     LAB RESULTS  Feeding Plan Breast Blood Type   O + Contraception  OCP Antibody  Negative Circumcision  Yes Rubella  Immune Pedia   GBS (group B Streptococcus carrier), +RV culture, currently pregnant 01/08/2019   Indication for care in labor or delivery 02/01/2019   Nausea/vomiting in pregnancy 07/10/2018    Past Surgical History:  Procedure Laterality Date   ADENOIDECTOMY     TONGUE SURGERY     TONSILLECTOMY      Family History  Problem Relation Age of Onset   Diabetes Paternal Grandmother    Drug abuse Mother    Cancer  Maternal Grandmother    Thyroid disease Maternal Grandmother    Ovarian cancer Maternal Grandmother    Hypertension Paternal Grandfather     Social History   Tobacco Use   Smoking status: Former    Types: Cigarettes   Smokeless tobacco: Never  Vaping Use   Vaping Use: Never used  Substance Use Topics   Alcohol use: Not Currently    Comment: occasionally   Drug use: No    Allergies:  Allergies  Allergen Reactions   Latex Rash   Shellfish Allergy Rash    Medications Prior to Admission  Medication Sig Dispense Refill Last Dose   calcium carbonate (TUMS - DOSED IN MG ELEMENTAL CALCIUM) 500 MG chewable tablet Chew 1 tablet by mouth 3 (three) times daily as needed for indigestion or heartburn.      coconut oil OIL Apply 1 application topically as needed.  0    DULoxetine (CYMBALTA) 20 MG capsule Take 1 capsule (20 mg total) by mouth 2 (two) times daily. 60 capsule 3    ferrous sulfate 325 (65 FE)  MG tablet Take 1 tablet (325 mg total) by mouth daily with breakfast. 30 tablet 11    ibuprofen (ADVIL) 600 MG tablet Take 1 tablet (600 mg total) by mouth every 6 (six) hours as needed. 30 tablet 1    Prenatal Vit-Fe Fumarate-FA (PRENATAL MULTIVITAMIN) TABS tablet Take 1 tablet by mouth daily at 12 noon.       Review of Systems  Constitutional: Negative.   Gastrointestinal:  Positive for abdominal distention, nausea and vomiting. Negative for constipation and diarrhea.  Genitourinary: Negative.   Physical Exam   Blood pressure 120/69, pulse 89, temperature 98.4 F (36.9 C), temperature source Oral, resp. rate 16, height 5\' 4"  (1.626 m), weight 64.4 kg, SpO2 100 %, currently breastfeeding.  Physical Exam Vitals and nursing note reviewed.  Constitutional:      Appearance: She is well-developed. She is not ill-appearing.  HENT:     Head: Normocephalic and atraumatic.  Eyes:     General: No scleral icterus. Pulmonary:     Effort: Pulmonary effort is normal. No respiratory  distress.  Abdominal:     General: Abdomen is flat.     Palpations: Abdomen is soft.     Tenderness: There is no abdominal tenderness.  Skin:    General: Skin is warm and dry.  Neurological:     General: No focal deficit present.     Mental Status: She is alert.  Psychiatric:        Mood and Affect: Mood normal.        Behavior: Behavior normal.    MAU Course  Procedures Results for orders placed or performed during the hospital encounter of 10/22/21 (from the past 24 hour(s))  Pregnancy, urine POC     Status: Abnormal   Collection Time: 10/22/21  3:59 PM  Result Value Ref Range   Preg Test, Ur POSITIVE (A) NEGATIVE  Urinalysis, Routine w reflex microscopic Urine, Clean Catch     Status: Abnormal   Collection Time: 10/22/21  4:39 PM  Result Value Ref Range   Color, Urine YELLOW YELLOW   APPearance HAZY (A) CLEAR   Specific Gravity, Urine 1.026 1.005 - 1.030   pH 5.0 5.0 - 8.0   Glucose, UA NEGATIVE NEGATIVE mg/dL   Hgb urine dipstick NEGATIVE NEGATIVE   Bilirubin Urine NEGATIVE NEGATIVE   Ketones, ur 80 (A) NEGATIVE mg/dL   Protein, ur NEGATIVE NEGATIVE mg/dL   Nitrite NEGATIVE NEGATIVE   Leukocytes,Ua NEGATIVE NEGATIVE  CBC     Status: None   Collection Time: 10/22/21  5:14 PM  Result Value Ref Range   WBC 6.1 4.0 - 10.5 K/uL   RBC 4.91 3.87 - 5.11 MIL/uL   Hemoglobin 14.0 12.0 - 15.0 g/dL   HCT 10/24/21 22.0 - 25.4 %   MCV 84.9 80.0 - 100.0 fL   MCH 28.5 26.0 - 34.0 pg   MCHC 33.6 30.0 - 36.0 g/dL   RDW 27.0 62.3 - 76.2 %   Platelets 292 150 - 400 K/uL   nRBC 0.0 0.0 - 0.2 %   83.1 OB Comp Less 14 Wks  Result Date: 10/22/2021 CLINICAL DATA:  Abdominal pain. Quantitative beta HCG is not known. Unknown LMP EXAM: OBSTETRIC <14 WK ULTRASOUND TECHNIQUE: Transabdominal ultrasound was performed for evaluation of the gestation as well as the maternal uterus and adnexal regions. COMPARISON:  None. FINDINGS: Intrauterine gestational sac: Single Yolk sac:  Visualized. Embryo:   Not Visualized. Cardiac Activity: Not Visualized. MSD:  18.4 mm   6  w   5 d Subchorionic hemorrhage:  None visualized. Maternal uterus/adnexae: Normal appearance of both ovaries. No free pelvic fluid. IMPRESSION: Intrauterine gestational sac and gestational sac are present. Embryo is not yet visualized. Follow-up ultrasound is recommended in 14 or more days to assess presence of fetal pole and ultrasound dating. Electronically Signed   By: Norva Pavlov M.D.   On: 10/22/2021 18:09    MDM +UPT UA, CBC, ABO/Rh, quant hCG, and [place] today to rule out ectopic pregnancy which can be life threatening.   Ultrasound shows IUGS with yolk sac. Will order outpatient viability/dating ultrasound in 10-14 days. Patient will then f/u with [facility] for prenatal care.   IV fluids, phenergan, pepcid, & scop patch given. Reports improvement in symptoms. Will rx scop patch & phenergan.   Assessment and Plan   1. Normal IUP (intrauterine pregnancy) on prenatal ultrasound, first trimester   2. Abdominal pain during pregnancy in first trimester  -reviewed reasons to return to MAU -f/u viability ultrasound ordered  3. Nausea and vomiting during pregnancy prior to [redacted] weeks gestation  -rx scop patch & phenergan  4. [redacted] weeks gestation of pregnancy      Judeth Horn 10/22/2021, 5:01 PM

## 2021-10-22 NOTE — MAU Note (Signed)
Nicole Harris is a 25 y.o. here in MAU reporting: unable to keep anything down for the past couple of weeks. States not even able to hold down water. Also been having lower abdominal pain for the past week.  LMP: unknown, no period since feb when she had her daughter  Onset of complaint: ongoing  Pain score: 0/10  Vitals:   10/22/21 1638  BP: 120/69  Pulse: 89  Resp: 16  Temp: 98.4 F (36.9 C)  SpO2: 100%     Lab orders placed from triage: upt, ua

## 2021-10-22 NOTE — Discharge Instructions (Signed)
Return to care  If you have heavier bleeding that soaks through more than 2 pads per hour for an hour or more If you bleed so much that you feel like you might pass out or you do pass out If you have significant abdominal pain that is not improved with Tylenol   

## 2021-10-31 ENCOUNTER — Ambulatory Visit: Admission: RE | Admit: 2021-10-31 | Payer: Medicaid Other | Source: Ambulatory Visit

## 2021-12-13 ENCOUNTER — Ambulatory Visit: Payer: Medicaid Other

## 2021-12-27 ENCOUNTER — Encounter: Payer: Self-pay | Admitting: Obstetrics and Gynecology

## 2021-12-27 NOTE — Telephone Encounter (Signed)
Send info to boss to place on my schedule

## 2021-12-28 ENCOUNTER — Ambulatory Visit (INDEPENDENT_AMBULATORY_CARE_PROVIDER_SITE_OTHER): Payer: Medicaid Other | Admitting: Licensed Clinical Social Worker

## 2021-12-28 ENCOUNTER — Other Ambulatory Visit: Payer: Self-pay

## 2021-12-28 DIAGNOSIS — F329 Major depressive disorder, single episode, unspecified: Secondary | ICD-10-CM

## 2021-12-31 NOTE — L&D Delivery Note (Signed)
[facility]N [facility] Delivery Note  Nicole Harris is a 26 y.o. V6P7948 s/p VD at [redacted]w[redacted]d. She was admitted for eIOL.   ROM: 0h 82m with clear fluid GBS Status:  Negative/-- (09/21 1053)  Labor Progress: Initial SVE: 1/50/-2. She then progressed to complete.   Delivery Date/Time: 10/13/22 2306 Delivery: Called to room and patient was complete and pushing. Head delivered ROA. Nuchal cord present x1. Shoulder and body delivered in usual fashion. Infant with spontaneous cry, placed on mother's abdomen, dried and stimulated. Cord clamped x 2 after 1-minute delay, and cut by FOB. Cord blood drawn. Placenta delivered spontaneously with gentle cord traction. Fundus firm with massage and Pitocin. Labia, perineum, vagina, and cervix inspected with no lacerations noted.  Baby Weight: pending  Placenta: 3 vessel, intact. Sent to L&D Complications: None Lacerations: none EBL: 84 mL Analgesia: Epidural   Infant:  APGAR (1 MIN):  9 APGAR (5 MINS):  Sigel, DO OB Family Medicine Fellow, [facility] for [employer], [facility] 10/13/2022, 11:18 PM

## 2022-01-01 NOTE — BH Specialist Note (Signed)
Integrated Behavioral Health via Telemedicine Visit  01/01/2022 Nicole Harris [id number]  Number of Integrated Behavioral Health visits: 1 Session Start time: 230pm  Session End time: 302pm Total time: 32 mins via phone due to internet connection error   Referring Provider: Carloyn Jaeger CNM Patient/Family location: Home  [facility] Provider location: [facility]  All persons participating in visit: Pt Nicole Harris and LCSW A.Figueroaa Types of Service: Individual psychotherapy and Telephone visit  I connected with [name] and/or [name]'s n/a via  Telephone or Engineer, civil (consulting)  (Video is Caregility application) and verified that I am speaking with the correct person using two identifiers. Discussed confidentiality: Yes   I discussed the limitations of telemedicine and the availability of in person appointments.  Discussed there is a possibility of technology failure and discussed alternative modes of communication if that failure occurs.  I discussed that engaging in this telemedicine visit, they consent to the provision of behavioral healthcare and the services will be billed under their insurance.  Patient and/or legal guardian expressed understanding and consented to Telemedicine visit: Yes   Presenting Concerns: Patient and/or family reports the following symptoms/concerns: depression  Duration of problem: approx 10 months; Severity of problem: mild  Patient and/or Family's Strengths/Protective Factors: Concrete supports in place (healthy food, safe environments, etc.)  Goals Addressed: Patient will:  Reduce symptoms of: depression   Increase knowledge and/or ability of: coping skills   Demonstrate ability to: Increase healthy adjustment to current life circumstances  Progress towards Goals: Ongoing  Interventions: Interventions utilized:  Supportive Counseling Standardized Assessments completed: n/a    Assessment: Patient currently  experiencing depressoin  Patient may benefit from integrated behavioral health.  Plan: Follow up with behavioral health clinician on : 2 weeks mychart  Behavioral recommendations: Stress importance of self care and develop goals, begin journal writing to identify triggers.  Referral(s): Integrated [employer] (In Clinic)  I discussed the assessment and treatment plan with the patient and/or parent/guardian. They were provided an opportunity to ask questions and all were answered. They agreed with the plan and demonstrated an understanding of the instructions.   They were advised to call back or seek an in-person evaluation if the symptoms worsen or if the condition fails to improve as anticipated.  Gwyndolyn Saxon, LCSW

## 2022-01-11 ENCOUNTER — Encounter: Payer: Medicaid Other | Admitting: Licensed Clinical Social Worker

## 2022-02-02 ENCOUNTER — Telehealth: Payer: Self-pay | Admitting: Obstetrics and Gynecology

## 2022-02-02 NOTE — Telephone Encounter (Signed)
Nicole Harris called to say she has BV, and would like something called in.

## 2022-03-16 ENCOUNTER — Inpatient Hospital Stay (HOSPITAL_COMMUNITY)
Admission: AD | Admit: 2022-03-16 | Discharge: 2022-03-16 | Disposition: A | Discharge status: Home / Self Care | Payer: Medicaid Other | Attending: Obstetrics and Gynecology | Admitting: Obstetrics and Gynecology

## 2022-03-16 ENCOUNTER — Other Ambulatory Visit: Payer: Self-pay

## 2022-03-16 ENCOUNTER — Inpatient Hospital Stay (HOSPITAL_COMMUNITY): Payer: Medicaid Other

## 2022-03-16 ENCOUNTER — Encounter (HOSPITAL_COMMUNITY): Payer: Self-pay | Admitting: Obstetrics and Gynecology

## 2022-03-16 DIAGNOSIS — R1030 Lower abdominal pain, unspecified: Secondary | ICD-10-CM | POA: Insufficient documentation

## 2022-03-16 DIAGNOSIS — E860 Dehydration: Secondary | ICD-10-CM | POA: Insufficient documentation

## 2022-03-16 DIAGNOSIS — O09291 Supervision of pregnancy with other poor reproductive or obstetric history, first trimester: Secondary | ICD-10-CM | POA: Insufficient documentation

## 2022-03-16 DIAGNOSIS — Z3A08 8 weeks gestation of pregnancy: Secondary | ICD-10-CM | POA: Insufficient documentation

## 2022-03-16 DIAGNOSIS — O219 Vomiting of pregnancy, unspecified: Secondary | ICD-10-CM | POA: Insufficient documentation

## 2022-03-16 DIAGNOSIS — O26891 Other specified pregnancy related conditions, first trimester: Secondary | ICD-10-CM | POA: Insufficient documentation

## 2022-03-16 DIAGNOSIS — O99281 Endocrine, nutritional and metabolic diseases complicating pregnancy, first trimester: Secondary | ICD-10-CM | POA: Diagnosis not present

## 2022-03-16 HISTORY — DX: Headache, unspecified: R51.9

## 2022-03-16 LAB — URINALYSIS, ROUTINE W REFLEX MICROSCOPIC
Bilirubin Urine: NEGATIVE
Glucose, UA: NEGATIVE mg/dL
Hgb urine dipstick: NEGATIVE
Ketones, ur: 80 mg/dL — AB
Nitrite: NEGATIVE
Protein, ur: NEGATIVE mg/dL
Specific Gravity, Urine: 1.027 (ref 1.005–1.030)
pH: 6 (ref 5.0–8.0)

## 2022-03-16 LAB — POCT PREGNANCY, URINE: Preg Test, Ur: POSITIVE — AB

## 2022-03-16 MED ORDER — PROMETHAZINE HCL 25 MG PO TABS
25.0000 mg | ORAL_TABLET | Freq: Once | ORAL | Status: AC
  Administered 2022-03-16: 25 mg via ORAL
  Filled 2022-03-16: qty 1

## 2022-03-16 MED ORDER — DOXYLAMINE-PYRIDOXINE 10-10 MG PO TBEC: DELAYED_RELEASE_TABLET | ORAL | 1 refills | Status: DC

## 2022-03-16 MED ORDER — LACTATED RINGERS IV BOLUS: 1000.0000 mL | Freq: Once | INTRAVENOUS | Status: DC

## 2022-03-16 MED ORDER — SCOPOLAMINE 1 MG/3DAYS TD PT72
1.0000 | MEDICATED_PATCH | TRANSDERMAL | Status: DC
  Administered 2022-03-16: 1.5 mg via TRANSDERMAL
  Filled 2022-03-16: qty 1

## 2022-03-16 MED ORDER — SCOPOLAMINE 1 MG/3DAYS TD PT72: 1.0000 | MEDICATED_PATCH | TRANSDERMAL | 0 refills | Status: DC

## 2022-03-16 MED ORDER — SODIUM CHLORIDE 0.9 % IV SOLN
25.0000 mg | Freq: Four times a day (QID) | INTRAVENOUS | Status: DC | PRN
  Filled 2022-03-16: qty 1

## 2022-03-16 NOTE — Discharge Instructions (Signed)
Safe Medications in Pregnancy  ? ?Backache/Headache: ?Tylenol: 2 regular strength every 4 hours OR ?             2 Extra strength every 6 hours ? ?Nausea/Vomiting:  ?Bonine ?Dramamine ?Emetrol ?Ginger extract ?Sea bands ?Meclizine  ?Nausea medication to take during pregnancy:  ?Unisom (doxylamine succinate 25 mg tablets) Take one tablet daily at bedtime. If symptoms are not adequately controlled, the dose can be increased to a maximum recommended dose of two tablets daily (1/2 tablet in the morning, 1/2 tablet mid-afternoon and one at bedtime). ?Vitamin B6 100mg  tablets. Take one tablet twice a day (up to 200 mg per day). ? ? ? ?  ?

## 2022-03-16 NOTE — MAU Note (Signed)
.  Nicole Harris is a 26 y.o. at here in MAU reporting: nausea/vomiting x1-2 weeks, reports unable to keep food down but been able to consume some liquids. ?LMP: 01/12/2022 ?Onset of complaint: 1-2 weeks ?Pain score: 0 ?Vitals:  ? 03/16/22 1400  ?BP: 115/74  ?Pulse: 100  ?Resp: 18  ?Temp: 98.4 ?F (36.9 ?C)  ?SpO2: 98%  ?   ?FHT: N/A ?Lab orders placed from triage:   UPT ?

## 2022-03-16 NOTE — MAU Provider Note (Signed)
?History  ?  ? ?CSN: [id number] ? ?Arrival date and time: 03/16/22 1333 ? ? Event Date/Time  ? First Provider Initiated Contact with Patient 03/16/22 1501   ?  ? ?Chief Complaint  ?Patient presents with  ? Nausea  ? Emesis  ? ?She reports that she has not been able to keep much food or drink down for majority of pregnancy, redness, worsening past 2 days.  She had a small amount of grits this morning without emesis but has not kept any fluids down since yesterday.  She feels dehydrated (reports fatigue) and would like IV fluids.  She has had some lower abdominal cramping in the setting of this.  No emesis today, however several times yesterday and appeared like food/drink she last consumed.  She reports a history of hyperemesis with her last pregnancy that was ultimately terminated due to significant weight loss.  She does not have any nausea medicine at home.  She is found no relief with Zofran in the past. She has previously used mint for her other pregnancies, but this time it makes her more nauseous.  ? ?She denies any leakage of fluid, vaginal discharge, vaginal bleeding, or diarrhea/constipation.  No known sick contacts. ? ?She has a new patient intake on 3/20 with [facility].  ? ?Past Medical History:  ?Diagnosis Date  ? ADHD (attention deficit hyperactivity disorder)   ? Anemia   ? Anxiety   ? Bipolar disorder ([place])   ? Depression   ? Headache   ? Nausea/vomiting in pregnancy 07/10/2018  ? ? ?Past Surgical History:  ?Procedure Laterality Date  ? ADENOIDECTOMY    ? TONGUE SURGERY    ? TONSILLECTOMY    ? ? ?Family History  ?Problem Relation Age of Onset  ? Drug abuse Mother   ? Healthy Father   ? Cancer Maternal Grandmother   ? Thyroid disease Maternal Grandmother   ? Ovarian cancer Maternal Grandmother   ? Diabetes Paternal Grandmother   ? Hypertension Paternal Grandfather   ? ? ?Social History  ? ?Tobacco Use  ? Smoking status: Former  ?  Types: Cigarettes  ? Smokeless tobacco: Never  ?Vaping Use  ? Vaping Use:  Never used  ?Substance Use Topics  ? Alcohol use: Not Currently  ?  Comment: occasionally  ? Drug use: No  ? ? ?Allergies:  ?Allergies  ?Allergen Reactions  ? Latex Rash  ? Shellfish Allergy Rash  ? ? ?Medications Prior to Admission  ?Medication Sig Dispense Refill Last Dose  ? coconut oil OIL Apply 1 application topically as needed.  0 Past Month  ? dimenhyDRINATE (DRAMAMINE PO) Take by mouth 2 (two) times daily.   03/15/2022  ? famotidine (PEPCID) 10 MG tablet Take 10 mg by mouth 2 (two) times daily.   03/15/2022  ? Prenatal Vit-Fe Fumarate-FA (PRENATAL MULTIVITAMIN) TABS tablet Take 1 tablet by mouth daily at 12 noon.   03/15/2022  ? calcium carbonate (TUMS - DOSED IN MG ELEMENTAL CALCIUM) 500 MG chewable tablet Chew 1 tablet by mouth 3 (three) times daily as needed for indigestion or heartburn.     ? DULoxetine (CYMBALTA) 20 MG capsule Take 1 capsule (20 mg total) by mouth 2 (two) times daily. 60 capsule 3   ? ferrous sulfate 325 (65 FE) MG tablet Take 1 tablet (325 mg total) by mouth daily with breakfast. 30 tablet 11   ? promethazine (PHENERGAN) 25 MG tablet Take 1 tablet (25 mg total) by mouth every 6 (six) hours as needed  for nausea or vomiting. 30 tablet 1   ? ? ?Review of Systems  ?Constitutional:  Positive for fatigue. Negative for fever.  ?Respiratory:  Negative for chest tightness and shortness of breath.   ?Cardiovascular:  Negative for leg swelling.  ?Gastrointestinal:  Positive for nausea and vomiting. Negative for abdominal pain, constipation and diarrhea.  ?Genitourinary:  Negative for dysuria, vaginal bleeding and vaginal discharge.  ?Musculoskeletal:  Negative for back pain.  ?Skin:  Negative for pallor and rash.  ?Neurological:  Negative for dizziness, weakness, light-headedness, numbness and headaches.  ?Psychiatric/Behavioral:  Negative for behavioral problems.   ?Physical Exam  ? ?Blood pressure 115/74, pulse 100, temperature 98.4 ?F (36.9 ?C), temperature source Oral, resp. rate 18, height 5'  4" (1.626 m), weight 62.7 kg, last menstrual period 01/12/2022, SpO2 98 %, unknown if currently breastfeeding. ? ?Physical Exam ?Constitutional:   ?   General: She is not in acute distress. ?   Appearance: Normal appearance. She is not ill-appearing.  ?   Comments: Appears tired but nontoxic  ?HENT:  ?   Head: Normocephalic and atraumatic.  ?   Nose: Nose normal.  ?   Mouth/Throat:  ?   Comments: Slightly dry ?Eyes:  ?   Extraocular Movements: Extraocular movements intact.  ?Cardiovascular:  ?   Pulses: Normal pulses.  ?   Comments: Slightly tachycardic ?Pulmonary:  ?   Effort: Pulmonary effort is normal.  ?Abdominal:  ?   General: There is no distension.  ?   Palpations: Abdomen is soft.  ?   Tenderness: There is no guarding.  ?   Comments: Nontender with palpation throughout  ?Musculoskeletal:  ?   Cervical back: Normal range of motion.  ?Skin: ?   General: Skin is warm.  ?Neurological:  ?   Mental Status: She is alert and oriented to person, place, and time.  ?Psychiatric:     ?   Behavior: Behavior normal.  ? ? ?MAU Course  ? ?MDM ?UA with 80 ketones  ?IV fluid bolus, IV phenergan and reassess  ?OB US transvaginal in setting of cramping, suspect likely d/t poor po intake.  ? ?1600: Notified by RN that patient is refusing IV to be placed and would rather have ice chips. Will switch nausea medication to scopolamine patch.  Patient reports this worked well for her in a previous pregnancy. ? ?1840: Tolerated a cup of ice chips without difficulty or emesis. Scop patch in place. She is hungry and would like to try eating food at home, but would like an oral anti-emetic before trying.  ? ?1925: At time of discharge, patient vomited after taking phenergan and eating a cracker.  Discussed option of transitioning to a patient room (has been in a family room) and getting the IV placed with fluids/nausea medication vs picking up meds at the pharmacy and trying these with ice chips at home.  She would like to go home and try  this first, if unsuccessful will return to the MAU vs call Ob office. ? ?Assessment and Plan  ? ?1. Nausea and vomiting in pregnancy ?Clinically mild dehydration.  Offered IV fluids/IV antiemetic, however patient declined.  Trial of scopolamine patch and Phenergan somewhat successful, however did have 1 episode of emesis after eating a cracker/taking phenergan. Does not want zofran. Rx'd scopolamine patch and Diclegis, discussed sea bands, ginger, small frequent meals. ? ?2. [redacted] weeks gestation of pregnancy ?  ?3. Abdominal cramping in pregnancy  ?Resolved, in setting of poor po intake. Single  viable IUP at [redacted]w[redacted]d on [place].  ? ?Discharged home in stable condition.  MAU return precautions discussed. ? ? ?Nicole Harris ?03/16/2022, 3:04 PM  ?

## 2022-03-16 NOTE — MAU Note (Addendum)
Here for fluids.  Can't keep anything down, on going problem.  Has been having HA, though does not have one today.  Reports has hx of depression, has tried to find a provider in the past "they just brush her off", has been "sad since her last delivery. Denies any plan or intent to harm self.  Terminated last pregnancy because of hyperemesis ?

## 2022-03-16 NOTE — MAU Note (Signed)
Reports brief moment of cramping on LLQ. None now.  Pt going to lobby to wait on [place]. ?

## 2022-03-19 ENCOUNTER — Ambulatory Visit: Payer: Medicaid Other

## 2022-04-16 ENCOUNTER — Other Ambulatory Visit: Payer: Self-pay

## 2022-04-16 ENCOUNTER — Ambulatory Visit (INDEPENDENT_AMBULATORY_CARE_PROVIDER_SITE_OTHER): Payer: Medicaid Other

## 2022-04-16 DIAGNOSIS — Z3A00 Weeks of gestation of pregnancy not specified: Secondary | ICD-10-CM

## 2022-04-16 DIAGNOSIS — Z3480 Encounter for supervision of other normal pregnancy, unspecified trimester: Secondary | ICD-10-CM | POA: Insufficient documentation

## 2022-04-16 NOTE — Patient Instructions (Signed)
English Link: [email or web address] ? ? ? ?

## 2022-04-16 NOTE — Progress Notes (Signed)
New OB Intake ? ?I connected with  Nicole Harris on 04/16/22 at  9:00 AM EDT by telephone visit and verified that I am speaking with the correct person using two identifiers. Nurse is located at [facility] and pt is located at home. ? ?I discussed the limitations, risks, security and privacy concerns of performing an evaluation and management service by telephone and the availability of in person appointments. I also discussed with the patient that there may be a patient responsible charge related to this service. The patient expressed understanding and agreed to proceed. ? ?I explained I am completing New OB Intake today. We discussed her EDD of 10/19/22 that is based on LMP of 01/12/2022. Pt is G4/P2. I reviewed her allergies, medications, Medical/Surgical/OB history, and appropriate screenings. I informed her of [facility] services. Based on history, this is a/an uncomplicated pregnancy. ? ?Patient Active Problem List  ? Diagnosis Date Noted  ? Supervision of other normal pregnancy, antepartum 04/16/2022  ? Pelvic floor dysfunction in female 02/16/2021  ? Stress incontinence in female 02/16/2021  ? Vaginal delivery 02/07/2021  ? Indication for care in labor or delivery 02/05/2021  ? History of depression 10/19/2020  ? Hx of delivery by vacuum extraction, currently pregnant 09/14/2020  ? Anemia in pregnancy 11/04/2018  ? ? ?Concerns addressed today: ?N&V - Pt has relief with antiemetic rx. ?PHQ9=1 ?GAD7=4 - Pt declined counseling services.  ? ?Delivery Plans:  ?Plans to deliver at [facility].  ? ?MyChart/Babyscripts ?MyChart access verified. I explained pt will have some visits in office and some virtually. Babyscripts instructions given and order placed. Patient verifies receipt of registration text/e-mail. Account successfully created and app downloaded. ? ?Blood Pressure Cuff  ?Has BP cuff from previous pregnancy. Pt will bring BP cuff next visit for service. Explained after first prenatal appt pt will check weekly  and document in Babyscripts. ? ?Weight scale: Patient does have weight scale. ? ? ?Labs ?Discussed Avelina Laine genetic screening with patient. Would like both Panorama and Horizon drawn at new OB visit.Also if interested in genetic testing, tell patient she will need AFP 15-21 weeks to complete genetic testing Routine prenatal labs needed. ? ?Covid Vaccine ?Patient has not covid vaccine. Declined. ? ? ?Informed patient of [facility] website  and placed link in her AVS.  ? ?Social Determinants of Health ?Food Insecurity: Patient denies food insecurity. ?WIC Referral: Patient is interested in referral to [facility].  ?Transportation: Patient denies transportation needs. ?Childcare: Discussed no children allowed at ultrasound appointments. Offered childcare services; patient declines childcare services at this time. ? ?Send link to Pregnancy Navigators ?[facility] [employer] has a partnership with the [employer] to provide prenatal navigation for the most needed resources in our community. In order to see how we can help connect you to these resources we need consent to contact you. Please complete the very short consent using the link below:  ? ?English Link: [email or web address] ? ? ? ?Placed OB Box on problem list and updated ? ?First visit review ?I reviewed new OB appt with pt. I explained she will have a pelvic exam, ob bloodwork with genetic screening, and PAP smear. Explained pt will be seen by Gerrit Heck, CNM at first visit; encounter routed to appropriate provider. Explained that patient will be seen by pregnancy navigator following visit with provider. [facility] information placed in AVS.  ? ?Dalphine Handing, CMA ?04/16/2022  9:28 AM ? ?

## 2022-04-18 NOTE — Progress Notes (Signed)
Patient was assessed and managed by nursing staff during this encounter. I have reviewed the chart and agree with the documentation and plan. I have also made any necessary editorial changes. ? ?Mora Bellman, MD ?04/18/2022 6:40 AM  ? ?

## 2022-04-25 ENCOUNTER — Telehealth: Payer: Self-pay

## 2022-04-25 ENCOUNTER — Other Ambulatory Visit: Payer: Self-pay | Admitting: Family Medicine

## 2022-04-25 DIAGNOSIS — O219 Vomiting of pregnancy, unspecified: Secondary | ICD-10-CM

## 2022-04-25 MED ORDER — SCOPOLAMINE 1 MG/3DAYS TD PT72: 1.0000 | MEDICATED_PATCH | TRANSDERMAL | 1 refills | Status: DC | PRN

## 2022-04-25 NOTE — Telephone Encounter (Signed)
Returned call and pt is requesting refill on scopolamine patch before NOB appt, routed to provider for review. ?

## 2022-04-27 ENCOUNTER — Ambulatory Visit (INDEPENDENT_AMBULATORY_CARE_PROVIDER_SITE_OTHER): Payer: Medicaid Other

## 2022-04-27 ENCOUNTER — Other Ambulatory Visit (HOSPITAL_COMMUNITY)
Admission: RE | Admit: 2022-04-27 | Discharge: 2022-04-27 | Disposition: A | Discharge status: Home / Self Care | Payer: Medicaid Other | Source: Ambulatory Visit

## 2022-04-27 VITALS — BP 119/69 | HR 90 | Wt 141.0 lb

## 2022-04-27 DIAGNOSIS — Z124 Encounter for screening for malignant neoplasm of cervix: Secondary | ICD-10-CM | POA: Diagnosis present

## 2022-04-27 DIAGNOSIS — I499 Cardiac arrhythmia, unspecified: Secondary | ICD-10-CM

## 2022-04-27 DIAGNOSIS — Z3A15 15 weeks gestation of pregnancy: Secondary | ICD-10-CM | POA: Diagnosis present

## 2022-04-27 DIAGNOSIS — F419 Anxiety disorder, unspecified: Secondary | ICD-10-CM

## 2022-04-27 DIAGNOSIS — Z8659 Personal history of other mental and behavioral disorders: Secondary | ICD-10-CM

## 2022-04-27 DIAGNOSIS — Z3480 Encounter for supervision of other normal pregnancy, unspecified trimester: Secondary | ICD-10-CM

## 2022-04-27 DIAGNOSIS — F319 Bipolar disorder, unspecified: Secondary | ICD-10-CM

## 2022-04-27 NOTE — Progress Notes (Signed)
NOB c/o sciatica pain. ?

## 2022-04-27 NOTE — Progress Notes (Signed)
?  ? ?Subjective:  ? ?Nicole Harris is a 26 y.o. [id number] at 11100w0d by Definite LMP of Jan 12, 2022 being seen today for her first obstetrical visit.  Patient states this was an unplanned pregnancy.  Patient reports she not on birth control prior to conception. She reports she has taken birth control, in the past, but does not feel like it is a good fit for her. ? ?Gynecological/Obstetrical History: ?Patient reports no history of gynecological surgeries.  Patient no history of pap smears.  ? ?Pregnancy history fully reviewed. Patient  is unsure if she will  intend to breast feed. Patient obstetrical history is remarkable for  VAVD in 2020 .  ? ?Sexual Activity and Vaginal Concerns: Patient is currently sexually active and denies pain or discomfort during intercourse.  She also denies vaginal discharge, bleeding, irritation, or odor. Patient also denies pain or difficulty with urination.  ? ? ?Medical History/ROS: Patient denies medical history significant for cardiovascular, respiratory, gastrointestinal, or hematological disorders. Patient endorses history of MH disorders including anxiety and/or depression as well as bipolar disorder.  ? ?Patient reports  sciatica pain .  Patient endorses constipation and diarrhea as well as nausea and vomiting.  She reports she is not "too worried about her bowel movements" and has dicleglis for her nausea.   ? ?She denies drug or ETOH usage. She reports FOB is Estée Laudershaun [place] who is involved, supportive, but not present today.  Patient reports that she lives with FOB and kids and endorses safety at home.  Patient denies DV/A. Patient is not currently employed. ? ? ?HISTORY: ?OB History  ?Gravida Para Term Preterm AB Living  ?4 2 2  0 1 2  ?SAB IAB Ectopic Multiple Live Births  ?0 1 0 0 2  ?  ?# Outcome Date GA Lbr Len/2nd Weight Sex Delivery Anes PTL Lv  ?4 Current           ?3 IAB 11/06/21     TAB     ?2 Term 02/05/21 983w2d / 02:23 7 lb 12.3 oz (3.524 kg) F Vag-Spont EPI   LIV  ?   Name: Jolaine ClickWISEBURN,GIRL Ishika  ?   Apgar1: 9  Apgar5: 8  ?1 Term 02/02/19 7554w0d 25:57 / 01:37 8 lb 12.9 oz (3.994 kg) M Vag-Vacuum EPI  LIV  ?   Name: Abran DukeWISEBURN,BOY Ilynn  ?   Apgar1: 1  Apgar5: 9  ?  ?No pap smear on file.  Completed today. ? ?Past Medical History:  ?Diagnosis Date  ? ADHD (attention deficit hyperactivity disorder)   ? Anemia   ? Anxiety   ? Bipolar disorder ([facility])   ? Depression   ? Headache   ? Nausea/vomiting in pregnancy 07/10/2018  ? ?Past Surgical History:  ?Procedure Laterality Date  ? ADENOIDECTOMY    ? TONGUE SURGERY    ? TONSILLECTOMY    ? ?Family History  ?Problem Relation Age of Onset  ? Drug abuse Mother   ? Healthy Father   ? Cancer Maternal Grandmother   ? Thyroid disease Maternal Grandmother   ? Ovarian cancer Maternal Grandmother   ? Diabetes Paternal Grandmother   ? Hypertension Paternal Grandfather   ? ?Social History  ? ?Tobacco Use  ? Smoking status: Former  ?  Types: Cigarettes  ?  Passive exposure: Past  ? Smokeless tobacco: Never  ?Vaping Use  ? Vaping Use: Never used  ?Substance Use Topics  ? Alcohol use: Not Currently  ?  Comment: occasionally  ?  Drug use: No  ? ?Allergies  ?Allergen Reactions  ? Latex Rash  ? Shellfish Allergy Rash  ? ?Current Outpatient Medications on File Prior to Visit  ?Medication Sig Dispense Refill  ? calcium carbonate (TUMS - DOSED IN MG ELEMENTAL CALCIUM) 500 MG chewable tablet Chew 1 tablet by mouth 3 (three) times daily as needed for indigestion or heartburn.    ? coconut oil OIL Apply 1 application topically as needed.  0  ? dimenhyDRINATE (DRAMAMINE PO) Take by mouth 2 (two) times daily. (Patient not taking: Reported on 04/16/2022)    ? Doxylamine-Pyridoxine (DICLEGIS) 10-10 MG TBEC Take 2 tabs at bedtime. If needed, add another tab in the morning. If needed, add another tab in the afternoon, up to 4 tabs/day. 100 tablet 1  ? DULoxetine (CYMBALTA) 20 MG capsule Take 1 capsule (20 mg total) by mouth 2 (two) times daily. 60 capsule 3  ?  famotidine (PEPCID) 10 MG tablet Take 10 mg by mouth 2 (two) times daily. (Patient not taking: Reported on 04/16/2022)    ? ferrous sulfate 325 (65 FE) MG tablet Take 1 tablet (325 mg total) by mouth daily with breakfast. 30 tablet 11  ? Prenatal Vit-Fe Fumarate-FA (PRENATAL MULTIVITAMIN) TABS tablet Take 1 tablet by mouth daily at 12 noon.    ? promethazine (PHENERGAN) 25 MG tablet Take 1 tablet (25 mg total) by mouth every 6 (six) hours as needed for nausea or vomiting. 30 tablet 1  ? scopolamine (TRANSDERM-SCOP) 1 MG/3DAYS Place 1 patch (1.5 mg total) onto the skin every 3 (three) days as needed. 10 patch 1  ? ?No current facility-administered medications on file prior to visit.  ? ? ?Review of Systems ?Pertinent items noted in HPI and remainder of comprehensive ROS otherwise negative. ? ?Exam  ? ?Vitals:  ? 04/27/22 1016  ?BP: 119/69  ?Pulse: 90  ?Weight: 141 lb (64 kg)  ? ?Fetal Heart Rate (bpm): 150 ? ?Physical Exam ?Constitutional:   ?   Appearance: Normal appearance.  ?Genitourinary:  ?   Genitourinary Comments: CV collected ?Pap collected with brush and spatula  ?   Vaginal discharge present.  ?   No cervical motion tenderness, discharge, friability, polyp or nabothian cyst.  ?   Uterus is enlarged and tender.  ?HENT:  ?   Head: Normocephalic and atraumatic.  ?Eyes:  ?   Conjunctiva/sclera: Conjunctivae normal.  ?Cardiovascular:  ?   Rate and Rhythm: Normal rate. Rhythm irregular.  ?   Heart sounds: Normal heart sounds.  ?Pulmonary:  ?   Effort: Pulmonary effort is normal. No respiratory distress.  ?   Breath sounds: Normal breath sounds.  ?Abdominal:  ?   General: Bowel sounds are normal.  ?   Palpations: Abdomen is soft.  ?Musculoskeletal:  ?   Cervical back: Normal range of motion.  ?Neurological:  ?   Mental Status: She is alert and oriented to person, place, and time.  ?Skin: ?   General: Skin is warm and dry.  ?Psychiatric:     ?   Mood and Affect: Mood normal.     ?   Behavior: Behavior normal.   ?Vitals reviewed. Exam conducted with a chaperone present.  ? ? ?Assessment:  ? ?26 y.o. year old [id number] ?Patient Active Problem List  ? Diagnosis Date Noted  ? Regularly irregular pulse rhythym 04/28/2022  ? Supervision of other normal pregnancy, antepartum 04/16/2022  ? Pelvic floor dysfunction in female 02/16/2021  ? History of depression 10/19/2020  ?  Hx of delivery by vacuum extraction, currently pregnant 09/14/2020  ? ?  ?Plan:  ?1. Supervision of other normal pregnancy, antepartum ?-Congratulations given and patient welcomed to practice. ?-Discussed usage of Babyscripts and virtual visits as additional source of managing and completing PN visits.   ?*Instructed to take blood pressure and record weekly into babyscripts. ?*Reviewed prenatal visit schedule and platforms used for virtual visits.  ?-Anticipatory guidance for prenatal visits including labs, ultrasounds, and testing; Initial labs collected. ?-Genetic Screening discussed, First trimester screen, Quad screen, and NIPS: ordered. ?-Encouraged to complete and utilize MyChart Registration for her ability to review results, send requests, and have questions addressed.  ?-Discussed estimated due date of October 19, 2022. ?-Ultrasound discussed; fetal anatomic survey: ordered. ?-Continue prenatal vitamins  ?-Encouraged to seek out care at office or emergency room for urgent and/or emergent concerns. ?-Educated on the nature of [facility] with multiple MDs and other Advanced Practice Providers was explained to patient; also emphasized that residents, students are part of our team. Informed of her right to refuse care as she deems appropriate.  ?-No questions or concerns.  ? ? ?2. Pap smear for cervical cancer screening ?-Completed today. ? ?3. [redacted] weeks gestation of pregnancy ?-Addressed complaints. ? ?4. History of depression ?-No medications. ?-Discussed ambulatory IBH referral. Patient agreeable. ? ?5. Regularly  irregular pulse rhythym ?-Informed of cardio findings. ?-Discussed how this could be incidental findings, pregnancy related, or related to other unknown underlying condition. ?-Patient c/o dizziness, but denies SOB an

## 2022-04-28 DIAGNOSIS — I499 Cardiac arrhythmia, unspecified: Secondary | ICD-10-CM | POA: Insufficient documentation

## 2022-04-29 LAB — URINE CULTURE, OB REFLEX

## 2022-04-29 LAB — CULTURE, OB URINE

## 2022-05-01 LAB — CERVICOVAGINAL ANCILLARY ONLY
Chlamydia: NEGATIVE
Comment: NEGATIVE
Comment: NEGATIVE
Comment: NORMAL
Neisseria Gonorrhea: NEGATIVE
Trichomonas: NEGATIVE

## 2022-05-02 ENCOUNTER — Other Ambulatory Visit: Payer: Self-pay

## 2022-05-02 DIAGNOSIS — R42 Dizziness and giddiness: Secondary | ICD-10-CM

## 2022-05-02 DIAGNOSIS — Z3A15 15 weeks gestation of pregnancy: Secondary | ICD-10-CM

## 2022-05-02 DIAGNOSIS — Z3480 Encounter for supervision of other normal pregnancy, unspecified trimester: Secondary | ICD-10-CM

## 2022-05-02 DIAGNOSIS — I499 Cardiac arrhythmia, unspecified: Secondary | ICD-10-CM

## 2022-05-02 LAB — HCV INTERPRETATION

## 2022-05-02 LAB — CBC/D/PLT+RPR+RH+ABO+RUBIGG...
Antibody Screen: NEGATIVE
Basophils Absolute: 0 10*3/uL (ref 0.0–0.2)
Basos: 0 %
EOS (ABSOLUTE): 0 10*3/uL (ref 0.0–0.4)
Eos: 0 %
HCV Ab: NONREACTIVE
HIV Screen 4th Generation wRfx: NONREACTIVE
Hematocrit: 32.3 % — ABNORMAL LOW (ref 34.0–46.6)
Hemoglobin: 11.2 g/dL (ref 11.1–15.9)
Hepatitis B Surface Ag: NEGATIVE
Immature Grans (Abs): 0 10*3/uL (ref 0.0–0.1)
Immature Granulocytes: 0 %
Lymphocytes Absolute: 1.6 10*3/uL (ref 0.7–3.1)
Lymphs: 21 %
MCH: 28.1 pg (ref 26.6–33.0)
MCHC: 34.7 g/dL (ref 31.5–35.7)
MCV: 81 fL (ref 79–97)
Monocytes Absolute: 0.3 10*3/uL (ref 0.1–0.9)
Monocytes: 4 %
Neutrophils Absolute: 5.9 10*3/uL (ref 1.4–7.0)
Neutrophils: 75 %
Platelets: 243 10*3/uL (ref 150–450)
RBC: 3.99 x10E6/uL (ref 3.77–5.28)
RDW: 15 % (ref 11.7–15.4)
RPR Ser Ql: NONREACTIVE
Rh Factor: POSITIVE
Rubella Antibodies, IGG: 1.64 index (ref >0.99)
WBC: 7.9 10*3/uL (ref 3.4–10.8)

## 2022-05-02 LAB — AFP, SERUM, OPEN SPINA BIFIDA
AFP MoM: 0.82
AFP Value: 27.9 ng/mL
Gest. Age on Collection Date: 15.6 weeks
Maternal Age At EDD: 25.3 yr
OSBR Risk 1 IN: 10000
Test Results:: NEGATIVE
Weight: 141 [lb_av]

## 2022-05-02 LAB — HEMOGLOBIN A1C
Est. average glucose Bld gHb Est-mCnc: 105 mg/dL
Hgb A1c MFr Bld: 5.3 % (ref 4.8–5.6)

## 2022-05-09 DIAGNOSIS — R87612 Low grade squamous intraepithelial lesion on cytologic smear of cervix (LGSIL): Secondary | ICD-10-CM | POA: Insufficient documentation

## 2022-05-09 LAB — CYTOLOGY - PAP

## 2022-05-17 ENCOUNTER — Telehealth: Payer: Self-pay

## 2022-05-17 NOTE — Telephone Encounter (Addendum)
Called to confirm appt tomorrow. She confirmed.

## 2022-05-18 ENCOUNTER — Ambulatory Visit: Payer: Medicaid Other | Admitting: Cardiology

## 2022-05-22 NOTE — Progress Notes (Deleted)
Cardio-Obstetrics Clinic  New Evaluation  Date:  05/22/2022   ID:  Nicole Harris, DOB 10/25/1996, MRN [id number]  PCP:  Patient, No Pcp Per (Inactive)   [facility] Providers Cardiologist:  None  Electrophysiologist:  None       Referring MD: Gerrit Heck, CNM   Chief Complaint: Irregular heart rhythm  History of Present Illness:    Nicole Harris is a 26 y.o. female [G4P2012] who is being seen today for the evaluation of irregular heart rhythm at the request of Gerrit Heck, [place].   Patient seen by Gerrit Heck in clinic on 03/2022 where she was incidentally noted to have an irregular pulse on exam. She reported occasional dizziness but no other symptoms. She is now referred here for further evaluation.  Today, ***   Prior CV Studies Reviewed: The following studies were reviewed today: ***  Past Medical History:  Diagnosis Date   ADHD (attention deficit hyperactivity disorder)    Anemia    Anxiety    Bipolar disorder (HCC)    Depression    Headache    Nausea/vomiting in pregnancy 07/10/2018   Stress incontinence in female 02/16/2021    Past Surgical History:  Procedure Laterality Date   ADENOIDECTOMY     TONGUE SURGERY     TONSILLECTOMY     { Click here to update PMH, PSH, OB Hx then refresh note  :1}   OB History     Gravida  4   Para  2   Term  2   Preterm      AB  1   Living  2      SAB      IAB  1   Ectopic      Multiple  0   Live Births  2           { Click here to update OB Charting then refresh note  :1}    Current Medications: No outpatient medications have been marked as taking for the 05/25/22 encounter (Appointment) with Meriam Sprague, MD.     Allergies:   Latex and Shellfish allergy   Social History   Socioeconomic History   Marital status: Significant Other    Spouse name: Not on file   Number of children: 1   Years of education: Not on file   Highest education level: Not on file   Occupational History   Occupation: unemployed  Tobacco Use   Smoking status: Former    Types: Cigarettes    Passive exposure: Past   Smokeless tobacco: Never  Vaping Use   Vaping Use: Never used  Substance and Sexual Activity   Alcohol use: Not Currently    Comment: occasionally   Drug use: No   Sexual activity: Yes    Birth control/protection: None  Other Topics Concern   Not on file  Social History Narrative   Not on file   Social Determinants of Health   Financial Resource Strain: Not on file  Food Insecurity: Not on file  Transportation Needs: Not on file  Physical Activity: Not on file  Stress: Not on file  Social Connections: Not on file  { Click here to update SDOH then refresh :1}    Family History  Problem Relation Age of Onset   Drug abuse Mother    Healthy Father    Cancer Maternal Grandmother    Thyroid disease Maternal Grandmother    Ovarian cancer Maternal Grandmother    Diabetes Paternal Grandmother  Hypertension Paternal Grandfather    { Click here to update FH then refresh note    :1}   ROS:   Please see the history of present illness.    *** All other systems reviewed and are negative.   Labs/EKG Reviewed:    EKG:   EKG is *** ordered today.  The ekg ordered today demonstrates ***  Recent Labs: 04/27/2022: Hemoglobin 11.2; Platelets 243   Recent Lipid Panel No results found for: CHOL, TRIG, HDL, CHOLHDL, LDLCALC, LDLDIRECT  Physical Exam:    VS:  LMP 01/12/2022     Wt Readings from Last 3 Encounters:  04/27/22 141 lb (64 kg)  04/16/22 141 lb 3.2 oz (64 kg)  03/16/22 138 lb 3.2 oz (62.7 kg)     GEN: *** Well nourished, well developed in no acute distress HEENT: Normal NECK: No JVD; No carotid bruits LYMPHATICS: No lymphadenopathy CARDIAC: ***RRR, no murmurs, rubs, gallops RESPIRATORY:  Clear to auscultation without rales, wheezing or rhonchi  ABDOMEN: Soft, non-tender, non-distended MUSCULOSKELETAL:  No edema; No  deformity  SKIN: Warm and dry NEUROLOGIC:  Alert and oriented x 3 PSYCHIATRIC:  Normal affect    Risk Assessment/Risk Calculators:   { Click to calculate CARPREG II - THEN refresh note :1}    { Click to caclulate Mod WHO Class of CV Risk - THEN refresh note :1}     { Click for CHADS2VASc Score - THEN Refresh Note    :[id number]}      ASSESSMENT & PLAN:    #Irregular Rhythm: ECG today *** -Check zio  #Dizziness: Likely related to orthostasis in pregnancy which is very common. Discussed conservative measures such as increasing hydration, small frequent meals, slow position changes and compression socks.   There are no Patient Instructions on file for this visit.   Dispo:  No follow-ups on file.   Medication Adjustments/Labs and Tests Ordered: Current medicines are reviewed at length with the patient today.  Concerns regarding medicines are outlined above.  Tests Ordered: No orders of the defined types were placed in this encounter.  Medication Changes: No orders of the defined types were placed in this encounter.

## 2022-05-25 ENCOUNTER — Ambulatory Visit: Payer: Medicaid Other | Admitting: Cardiology

## 2022-05-25 ENCOUNTER — Other Ambulatory Visit: Payer: Self-pay | Admitting: *Deleted

## 2022-05-25 ENCOUNTER — Ambulatory Visit: Payer: Medicaid Other

## 2022-05-25 DIAGNOSIS — Z363 Encounter for antenatal screening for malformations: Secondary | ICD-10-CM | POA: Insufficient documentation

## 2022-05-25 DIAGNOSIS — O321XX0 Maternal care for breech presentation, not applicable or unspecified: Secondary | ICD-10-CM | POA: Insufficient documentation

## 2022-05-25 DIAGNOSIS — Z3480 Encounter for supervision of other normal pregnancy, unspecified trimester: Secondary | ICD-10-CM | POA: Insufficient documentation

## 2022-05-25 DIAGNOSIS — Z3A15 15 weeks gestation of pregnancy: Secondary | ICD-10-CM | POA: Insufficient documentation

## 2022-05-25 DIAGNOSIS — Z3A19 19 weeks gestation of pregnancy: Secondary | ICD-10-CM | POA: Insufficient documentation

## 2022-05-25 DIAGNOSIS — Z362 Encounter for other antenatal screening follow-up: Secondary | ICD-10-CM

## 2022-05-31 ENCOUNTER — Other Ambulatory Visit: Payer: Self-pay | Admitting: *Deleted

## 2022-05-31 MED ORDER — DICLEGIS 10-10 MG PO TBEC: DELAYED_RELEASE_TABLET | ORAL | 3 refills | Status: DC

## 2022-05-31 NOTE — Progress Notes (Signed)
Pt called to office needing refill on Diclegis. Refills sent to today.

## 2022-06-01 ENCOUNTER — Encounter: Payer: Medicaid Other | Admitting: Obstetrics and Gynecology

## 2022-06-11 IMAGING — US US OB < 14 WEEKS - US OB TV
1 series · 15 of 28 positions shown · non-contrast
Comparison: None.

CLINICAL DATA: Nausea.

EXAM:
OBSTETRIC <14 WK US AND TRANSVAGINAL OB US
TECHNIQUE: Both transabdominal and transvaginal ultrasound examinations were
performed for complete evaluation of the gestation as well as the
maternal uterus, adnexal regions, and pelvic cul-de-sac.
Transvaginal technique was performed to assess early pregnancy.

[Series 1: us ob < 14 weeks - us ob tv · 15 of 60 slices shown]
[im 1/60]
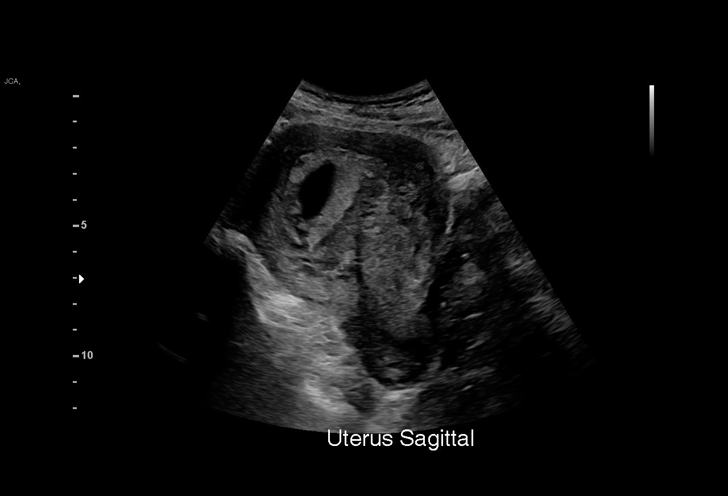
[im 5/60]
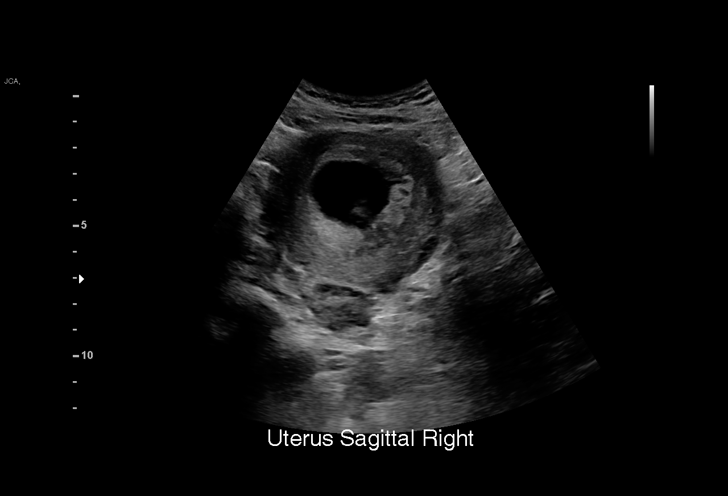
[im 9/60]
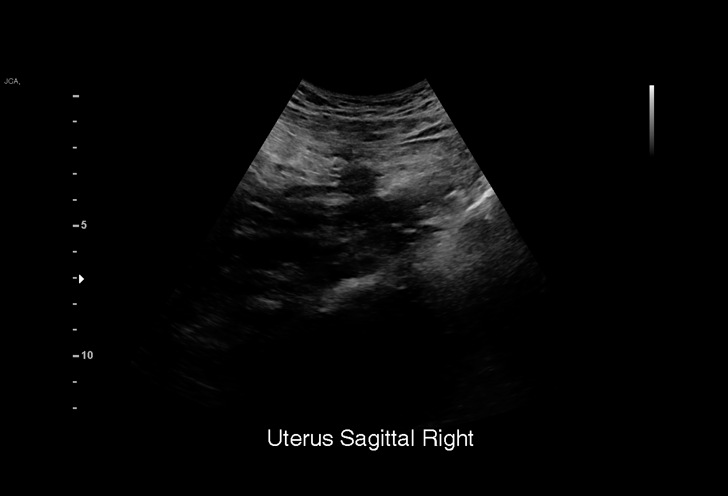
[im 14/60]
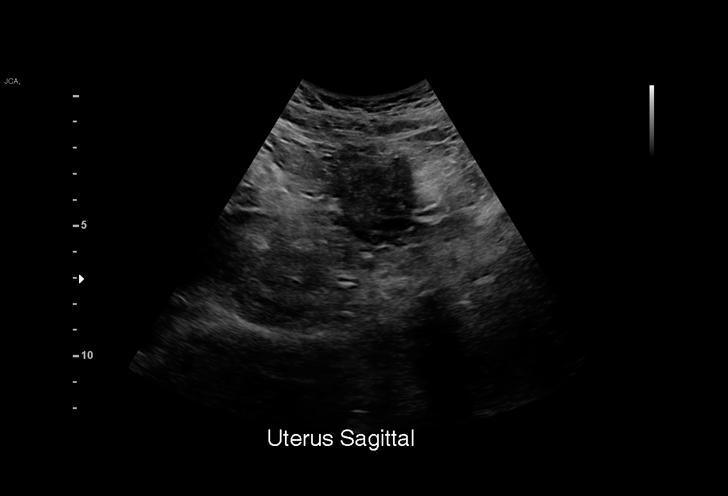
[im 18/60]
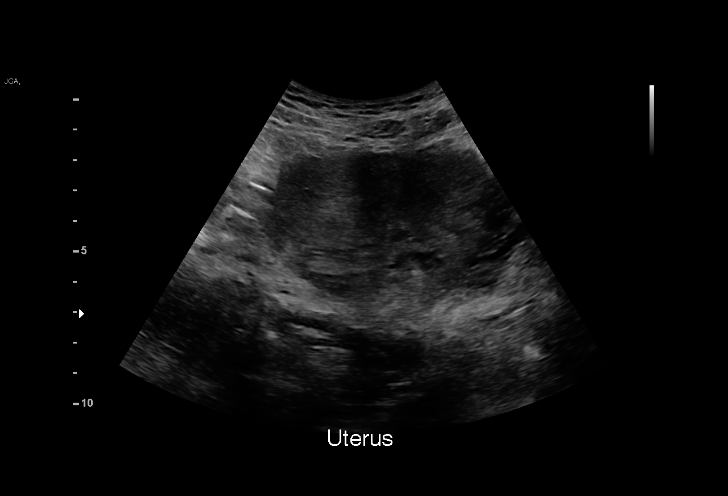
[im 22/60]
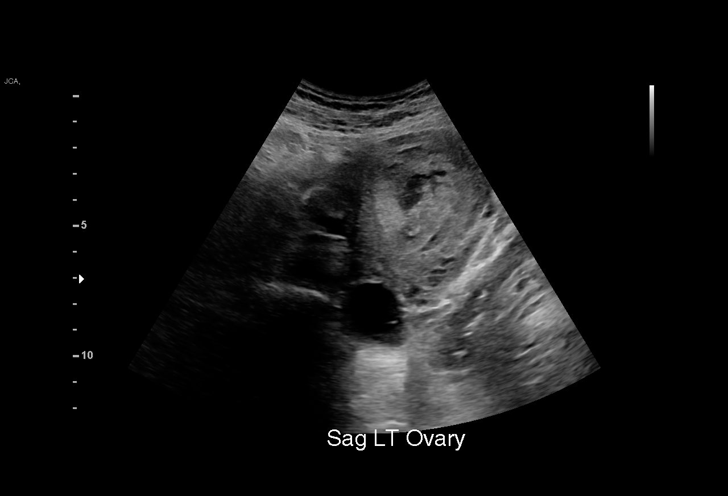
[im 27/60]
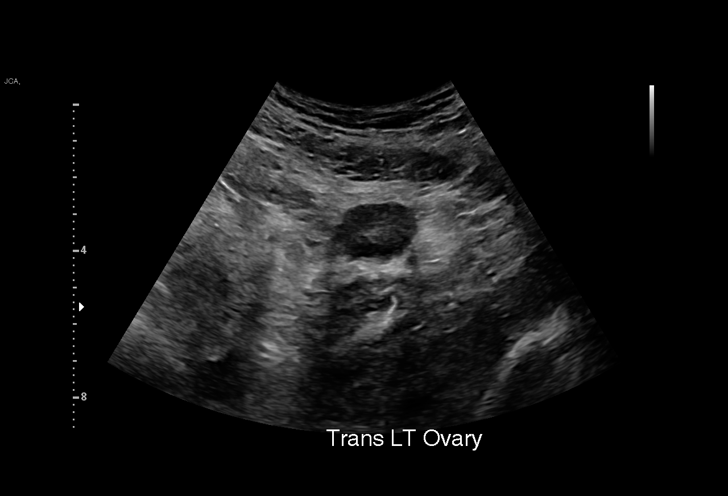
[im 31/60]
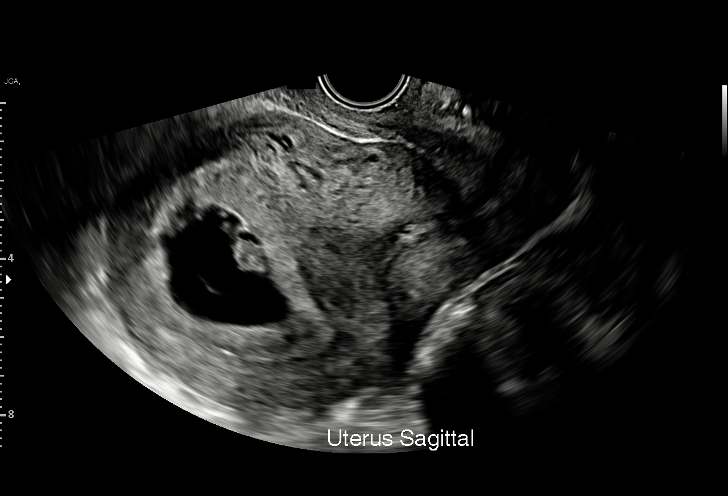
[im 33/60]
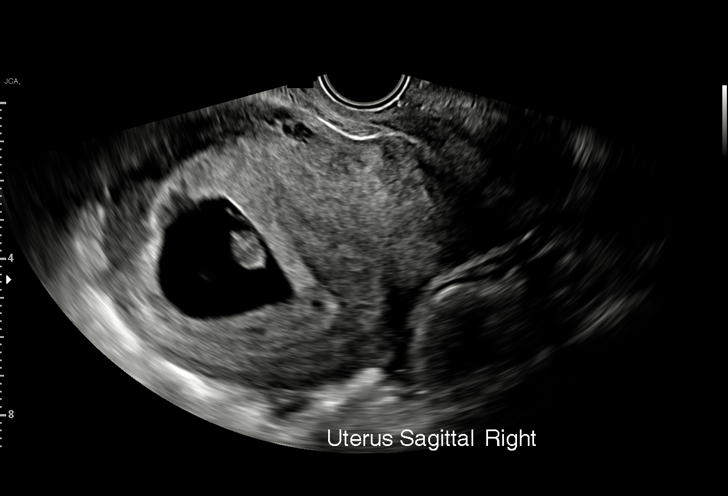
[im 38/60]
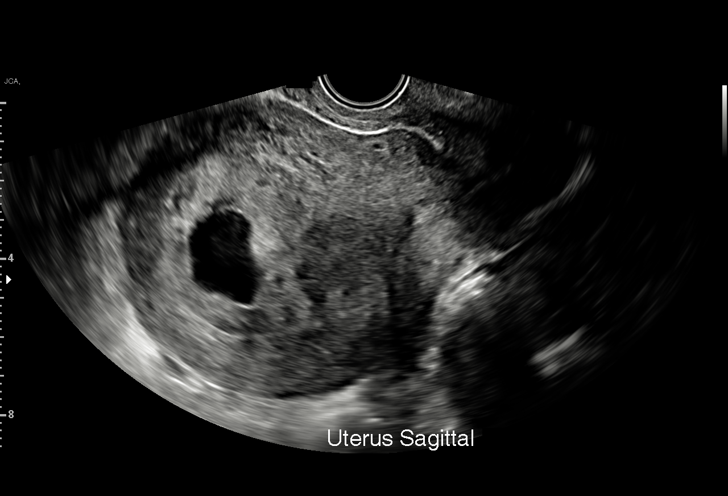
[im 42/60]
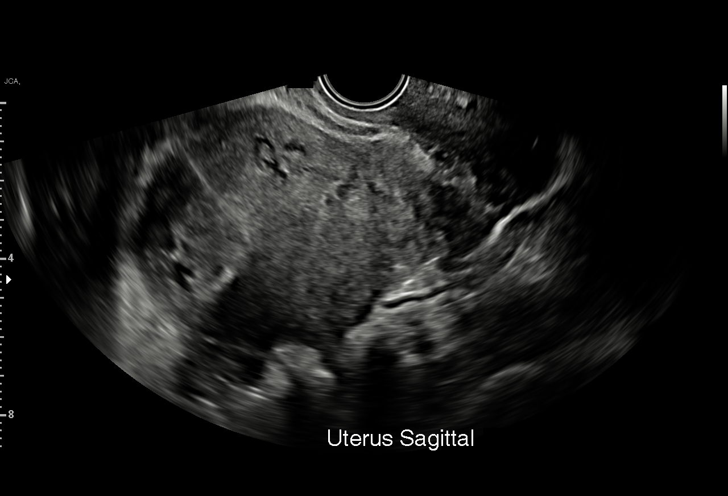
[im 46/60]
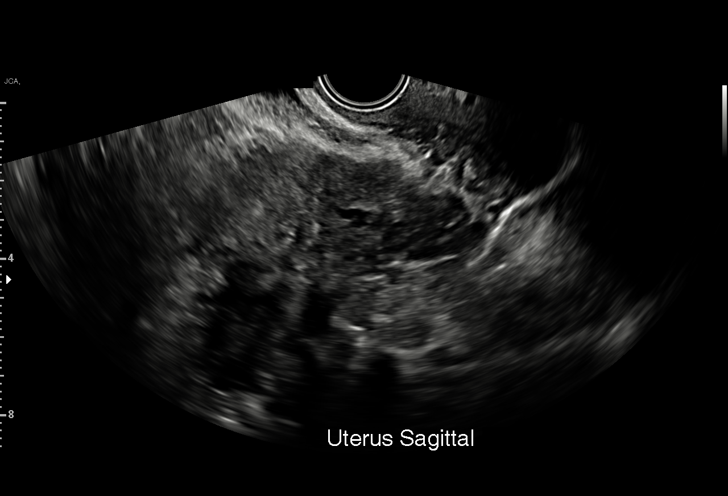
[im 51/60]
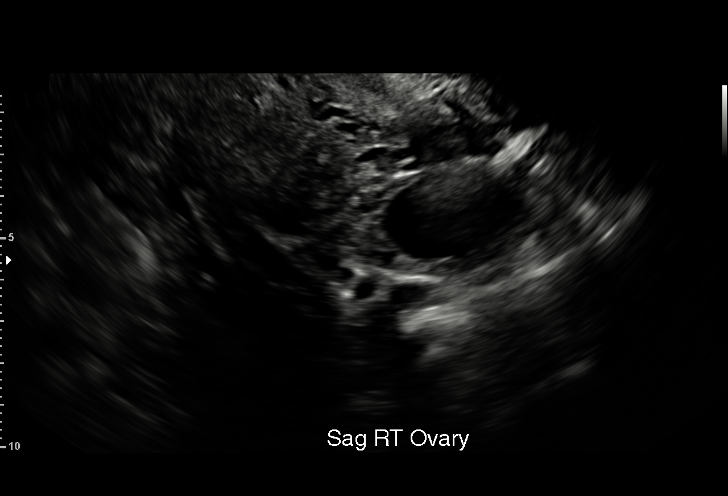
[im 55/60]
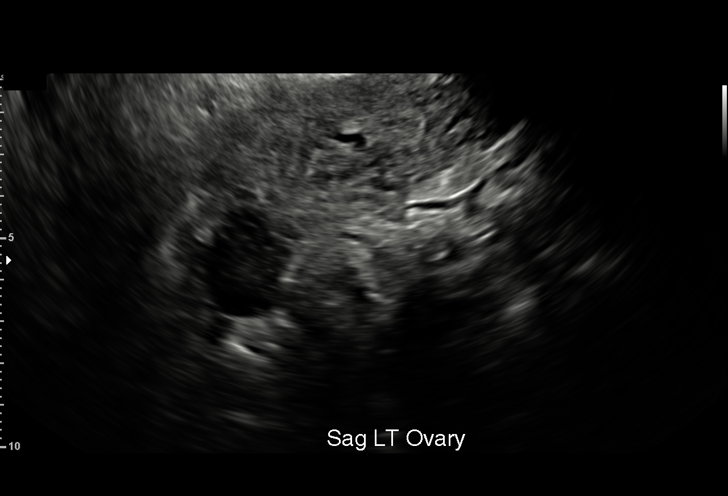
[im 60/60]
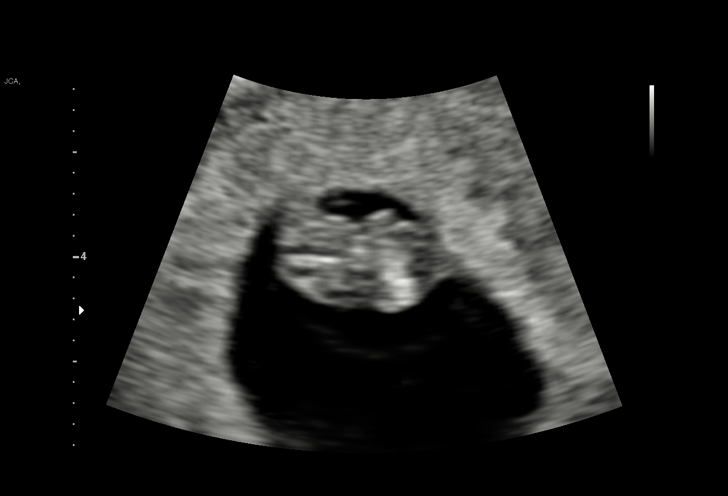

[15 of 28 positions shown; findings below may reference images not displayed]

FINDINGS: Intrauterine gestational sac: Single

Yolk sac:  Visualized.

Embryo:  Visualized.

Cardiac Activity: Visualized.

Heart Rate: 177 bpm

CRL:  18.8 mm   8 w   2 d                  US EDC: October 24, 2022

Subchorionic hemorrhage:  None visualized.

Maternal uterus/adnexae: The right ovary measures 4.8 cm x 2.7 cm x
4.1 cm.

The left ovary measures 2.7 cm x 2.0 cm x 2.0 cm.

No pelvic free fluid is seen.
IMPRESSION: Single, viable intrauterine pregnancy at approximately 8 weeks and 2
days gestation by ultrasound evaluation.

## 2022-06-15 ENCOUNTER — Ambulatory Visit: Payer: Medicaid Other | Admitting: Cardiology

## 2022-06-15 NOTE — Progress Notes (Deleted)
Cardio-Obstetrics Clinic  Follow Up Note   Date:  06/15/2022   ID:  Nicole Harris, DOB Mar 21, 1996, MRN [id number]  PCP:  Patient, No Pcp Per   [facility] Providers Cardiologist:  None  Electrophysiologist:  None   { Click to update primary MD,subspecialty MD or APP then REFRESH:1}     Referring MD: Gerrit Heck, CNM   Chief Complaint: ***  History of Present Illness:    Nicole Harris is a 26 y.o. female [G4P2012] who returns for follow up of ***   Prior CV Studies Reviewed: The following studies were reviewed today: ***  Past Medical History:  Diagnosis Date   ADHD (attention deficit hyperactivity disorder)    Anemia    Anxiety    Bipolar disorder (HCC)    Depression    Headache    Nausea/vomiting in pregnancy 07/10/2018   Stress incontinence in female 02/16/2021    Past Surgical History:  Procedure Laterality Date   ADENOIDECTOMY     TONGUE SURGERY     TONSILLECTOMY     { Click here to update PMH, PSH, OB Hx then refresh note  :1}   OB History     Gravida  4   Para  2   Term  2   Preterm      AB  1   Living  2      SAB      IAB  1   Ectopic      Multiple  0   Live Births  2           { Click here to update OB Charting then refresh note  :1}    Current Medications: No outpatient medications have been marked as taking for the 06/15/22 encounter (Appointment) with Thomasene Ripple, DO.     Allergies:   Latex and Shellfish allergy   Social History   Socioeconomic History   Marital status: Significant Other    Spouse name: Not on file   Number of children: 1   Years of education: Not on file   Highest education level: Not on file  Occupational History   Occupation: unemployed  Tobacco Use   Smoking status: Former    Types: Cigarettes    Passive exposure: Past   Smokeless tobacco: Never  Vaping Use   Vaping Use: Never used  Substance and Sexual Activity   Alcohol use: Not Currently    Comment: occasionally    Drug use: No   Sexual activity: Yes    Birth control/protection: None  Other Topics Concern   Not on file  Social History Narrative   Not on file   Social Determinants of Health   Financial Resource Strain: Low Risk  (01/30/2019)   Overall Financial Resource Strain (CARDIA)    Difficulty of Paying Living Expenses: Not hard at all  Food Insecurity: Food Insecurity Present (01/30/2019)   Hunger Vital Sign    Worried About Running Out of Food in the Last Year: Sometimes true    Ran Out of Food in the Last Year: Never true  Transportation Needs: Unmet Transportation Needs (01/30/2019)   PRAPARE - Administrator, Civil Service (Medical): Yes    Lack of Transportation (Non-Medical): Not on file  Physical Activity: Not on file  Stress: No Stress Concern Present (01/30/2019)   [employer] of Occupational Health - Occupational Stress Questionnaire    Feeling of Stress : Only a little  Social Connections: Not on file  {  Click here to update SDOH then refresh :1}    Family History  Problem Relation Age of Onset   Drug abuse Mother    Healthy Father    Cancer Maternal Grandmother    Thyroid disease Maternal Grandmother    Ovarian cancer Maternal Grandmother    Diabetes Paternal Grandmother    Hypertension Paternal Grandfather    { Click here to update FH then refresh note    :1}   ROS:   Please see the history of present illness.    *** All other systems reviewed and are negative.   Labs/EKG Reviewed:    EKG:   EKG is *** ordered today.  The ekg ordered today demonstrates ***  Recent Labs: 04/27/2022: Hemoglobin 11.2; Platelets 243   Recent Lipid Panel No results found for: "CHOL", "TRIG", "HDL", "CHOLHDL", "LDLCALC", "LDLDIRECT"  Physical Exam:    VS:  LMP 01/12/2022     Wt Readings from Last 3 Encounters:  04/27/22 141 lb (64 kg)  04/16/22 141 lb 3.2 oz (64 kg)  03/16/22 138 lb 3.2 oz (62.7 kg)     GEN: *** Well nourished, well developed in no  acute distress HEENT: Normal NECK: No JVD; No carotid bruits LYMPHATICS: No lymphadenopathy CARDIAC: ***RRR, no murmurs, rubs, gallops RESPIRATORY:  Clear to auscultation without rales, wheezing or rhonchi  ABDOMEN: Soft, non-tender, non-distended MUSCULOSKELETAL:  No edema; No deformity  SKIN: Warm and dry NEUROLOGIC:  Alert and oriented x 3 PSYCHIATRIC:  Normal affect    Risk Assessment/Risk Calculators:   { Click to calculate CARPREG II - THEN refresh note :1}    { Click to caclulate Mod WHO Class of CV Risk - THEN refresh note :1}     { Click for CHADS2VASc Score - THEN Refresh Note    :[id number]}      ASSESSMENT & PLAN:    *** There are no Patient Instructions on file for this visit.   Dispo:  No follow-ups on file.   Medication Adjustments/Labs and Tests Ordered: Current medicines are reviewed at length with the patient today.  Concerns regarding medicines are outlined above.  Tests Ordered: No orders of the defined types were placed in this encounter.  Medication Changes: No orders of the defined types were placed in this encounter.

## 2022-06-21 ENCOUNTER — Ambulatory Visit: Payer: Medicaid Other | Attending: Obstetrics and Gynecology

## 2022-06-21 DIAGNOSIS — Z362 Encounter for other antenatal screening follow-up: Secondary | ICD-10-CM

## 2022-06-21 DIAGNOSIS — O358XX0 Maternal care for other (suspected) fetal abnormality and damage, not applicable or unspecified: Secondary | ICD-10-CM | POA: Diagnosis not present

## 2022-06-21 DIAGNOSIS — Z3A22 22 weeks gestation of pregnancy: Secondary | ICD-10-CM

## 2022-06-21 NOTE — Progress Notes (Deleted)
Cardio-Obstetrics Clinic  New Evaluation  Date:  06/21/2022   ID:  Nicole Harris, DOB Oct 07, 1996, MRN [id number]  PCP:  Patient, No Pcp Per   [facility] Providers Cardiologist:  None  Electrophysiologist:  None       Referring MD: Nicole Harris, CNM   Chief Complaint: Irregular heart rhythm  History of Present Illness:    Nicole Harris is a 26 y.o. female [G4P2012] who is being seen today for the evaluation of irregular heart rhythm at the request of Nicole Harris, [place].   Patient seen by Nicole Harris in clinic on 03/2022 where she was incidentally noted to have an irregular pulse on exam. She reported occasional dizziness but no other symptoms. She is now referred here for further evaluation.  Today, ***   Prior CV Studies Reviewed: The following studies were reviewed today: ***  Past Medical History:  Diagnosis Date   ADHD (attention deficit hyperactivity disorder)    Anemia    Anxiety    Bipolar disorder (HCC)    Depression    Headache    Nausea/vomiting in pregnancy 07/10/2018   Stress incontinence in female 02/16/2021    Past Surgical History:  Procedure Laterality Date   ADENOIDECTOMY     TONGUE SURGERY     TONSILLECTOMY     { Click here to update PMH, PSH, OB Hx then refresh note  :1}   OB History     Gravida  4   Para  2   Term  2   Preterm      AB  1   Living  2      SAB      IAB  1   Ectopic      Multiple  0   Live Births  2           { Click here to update OB Charting then refresh note  :1}    Current Medications: No outpatient medications have been marked as taking for the 06/22/22 encounter (Appointment) with Meriam Sprague, MD.     Allergies:   Latex and Shellfish allergy   Social History   Socioeconomic History   Marital status: Significant Other    Spouse name: Not on file   Number of children: 1   Years of education: Not on file   Highest education level: Not on file  Occupational History    Occupation: unemployed  Tobacco Use   Smoking status: Former    Types: Cigarettes    Passive exposure: Past   Smokeless tobacco: Never  Vaping Use   Vaping Use: Never used  Substance and Sexual Activity   Alcohol use: Not Currently    Comment: occasionally   Drug use: No   Sexual activity: Yes    Birth control/protection: None  Other Topics Concern   Not on file  Social History Narrative   Not on file   Social Determinants of Health   Financial Resource Strain: Low Risk  (01/30/2019)   Overall Financial Resource Strain (CARDIA)    Difficulty of Paying Living Expenses: Not hard at all  Food Insecurity: Food Insecurity Present (01/30/2019)   Hunger Vital Sign    Worried About Running Out of Food in the Last Year: Sometimes true    Ran Out of Food in the Last Year: Never true  Transportation Needs: Unmet Transportation Needs (01/30/2019)   PRAPARE - Administrator, Civil Service (Medical): Yes    Lack of Transportation (Non-Medical):  Not on file  Physical Activity: Not on file  Stress: No Stress Concern Present (01/30/2019)   [employer] of Occupational Health - Occupational Stress Questionnaire    Feeling of Stress : Only a little  Social Connections: Not on file  { Click here to update SDOH then refresh :1}    Family History  Problem Relation Age of Onset   Drug abuse Mother    Healthy Father    Cancer Maternal Grandmother    Thyroid disease Maternal Grandmother    Ovarian cancer Maternal Grandmother    Diabetes Paternal Grandmother    Hypertension Paternal Grandfather    { Click here to update FH then refresh note    :1}   ROS:   Please see the history of present illness.    *** All other systems reviewed and are negative.   Labs/EKG Reviewed:    EKG:   EKG is *** ordered today.  The ekg ordered today demonstrates ***  Recent Labs: 04/27/2022: Hemoglobin 11.2; Platelets 243   Recent Lipid Panel No results found for: "CHOL", "TRIG",  "HDL", "CHOLHDL", "LDLCALC", "LDLDIRECT"  Physical Exam:    VS:  LMP 01/12/2022     Wt Readings from Last 3 Encounters:  04/27/22 141 lb (64 kg)  04/16/22 141 lb 3.2 oz (64 kg)  03/16/22 138 lb 3.2 oz (62.7 kg)     GEN: *** Well nourished, well developed in no acute distress HEENT: Normal NECK: No JVD; No carotid bruits LYMPHATICS: No lymphadenopathy CARDIAC: ***RRR, no murmurs, rubs, gallops RESPIRATORY:  Clear to auscultation without rales, wheezing or rhonchi  ABDOMEN: Soft, non-tender, non-distended MUSCULOSKELETAL:  No edema; No deformity  SKIN: Warm and dry NEUROLOGIC:  Alert and oriented x 3 PSYCHIATRIC:  Normal affect    Risk Assessment/Risk Calculators:   { Click to calculate CARPREG II - THEN refresh note :1}    { Click to caclulate Mod WHO Class of CV Risk - THEN refresh note :1}     { Click for CHADS2VASc Score - THEN Refresh Note    :[id number]}      ASSESSMENT & PLAN:    #Irregular Rhythm: ECG today *** -Check zio  #Dizziness: Likely related to orthostasis in pregnancy which is very common. Discussed conservative measures such as increasing hydration, small frequent meals, slow position changes and compression socks.   There are no Patient Instructions on file for this visit.   Dispo:  No follow-ups on file.   Medication Adjustments/Labs and Tests Ordered: Current medicines are reviewed at length with the patient today.  Concerns regarding medicines are outlined above.  Tests Ordered: No orders of the defined types were placed in this encounter.  Medication Changes: No orders of the defined types were placed in this encounter.

## 2022-06-22 ENCOUNTER — Ambulatory Visit: Payer: Medicaid Other | Admitting: Cardiology

## 2022-07-10 ENCOUNTER — Encounter: Payer: Medicaid Other | Admitting: Obstetrics & Gynecology

## 2022-07-10 ENCOUNTER — Other Ambulatory Visit: Payer: Self-pay

## 2022-07-10 ENCOUNTER — Other Ambulatory Visit: Payer: Self-pay | Admitting: Family Medicine

## 2022-07-10 MED ORDER — DICLEGIS 10-10 MG PO TBEC
DELAYED_RELEASE_TABLET | ORAL | 3 refills | Status: DC
Start: 2022-07-10 — End: 2022-08-10

## 2022-07-10 NOTE — Progress Notes (Signed)
Pharmacy states pt did not pick up rx and it has been closed out,  new order for diclegis sent

## 2022-07-16 ENCOUNTER — Ambulatory Visit (INDEPENDENT_AMBULATORY_CARE_PROVIDER_SITE_OTHER): Payer: Medicaid Other | Admitting: Student

## 2022-07-16 ENCOUNTER — Encounter: Payer: Self-pay | Admitting: Student

## 2022-07-16 VITALS — BP 127/66 | HR 88 | Wt 157.2 lb

## 2022-07-16 DIAGNOSIS — M5431 Sciatica, right side: Secondary | ICD-10-CM

## 2022-07-16 DIAGNOSIS — Z3A26 26 weeks gestation of pregnancy: Secondary | ICD-10-CM

## 2022-07-16 DIAGNOSIS — Z3480 Encounter for supervision of other normal pregnancy, unspecified trimester: Secondary | ICD-10-CM

## 2022-07-16 DIAGNOSIS — O26899 Other specified pregnancy related conditions, unspecified trimester: Secondary | ICD-10-CM

## 2022-07-16 DIAGNOSIS — I499 Cardiac arrhythmia, unspecified: Secondary | ICD-10-CM

## 2022-07-16 DIAGNOSIS — Z3482 Encounter for supervision of other normal pregnancy, second trimester: Secondary | ICD-10-CM

## 2022-07-16 DIAGNOSIS — R519 Headache, unspecified: Secondary | ICD-10-CM

## 2022-07-16 MED ORDER — CYCLOBENZAPRINE HCL 5 MG PO TABS: 5.0000 mg | ORAL_TABLET | Freq: Three times a day (TID) | ORAL | 0 refills | Status: DC | PRN

## 2022-07-16 MED ORDER — MAGNESIUM OXIDE -MG SUPPLEMENT 200 MG PO TABS: 400.0000 mg | ORAL_TABLET | Freq: Every day | ORAL | 3 refills | Status: DC

## 2022-07-16 NOTE — Progress Notes (Signed)
PRENATAL VISIT NOTE  Subjective:  Nicole Harris is a 26 y.o. [phone] at [redacted]w[redacted]d being seen today for ongoing prenatal care.  She is currently monitored for the following issues for this low-risk pregnancy and has Hx of delivery by vacuum extraction, currently pregnant; History of depression; Pelvic floor dysfunction in female; Supervision of other normal pregnancy, antepartum; Regularly irregular pulse rhythym; and Low grade squamous intraepithelial lesion (LGSIL) on cervical Pap smear on their problem list.  Patient reports fatigue, headache, and dizziness . Patient reports these symptoms have continued since her initial prenatal visit. CBC was WNL at that time so a referral was placed for Cardio Obstetrics. Patient was unable to attend appointment due to transportation issues and provider canceling appointment. Patient is requesting a referral again.Patient shared with CMA upon checking in that she feels short of breath at times while sitting still. Patient reports feeling this way for brief moments and describes the sensation as a "weight on her shoulders". Patient is not currently experiencing any of her reported symptoms.   Contractions: Not present. Vag. Bleeding: None.  Movement: Present. Denies leaking of fluid.   The following portions of the patient's history were reviewed and updated as appropriate: allergies, current medications, past family history, past medical history, past social history, past surgical history and problem list.   Objective:   Vitals:   07/16/22 1041  BP: 127/66  Pulse: 88  Weight: 157 lb 3.2 oz (71.3 kg)    Fetal Status: Fetal Heart Rate (bpm): 143 Fundal Height: 26 cm Movement: Present     General:  Alert, oriented and cooperative. Patient is in no acute distress.  Skin: Skin is warm and dry. No rash noted.   Cardiovascular: Normal heart rate noted  Respiratory: Normal respiratory effort, no problems with respiration noted  Abdomen: Soft, gravid,  appropriate for gestational age.   Pelvic: Cervical exam deferred        Extremities: Normal range of motion.  Edema: Trace at [facility]  Mental Status: Normal mood and affect. Normal behavior. Anxious thoughts   Assessment and Plan:  Pregnancy: [id number] at [redacted]w[redacted]d 1. Supervision of other normal pregnancy, antepartum - Normal exam, fetal movement is vigorous  2. [redacted] weeks gestation of pregnancy - Anticipatory guidance for next visit discussed - Plan for third trimester blood work and tdap  3. Right sciatic nerve pain - recurrent condition from prior pregnancies  - Magnesium Oxide -Mg Supplement (MAG-OXIDE) 200 MG TABS; Take 2 tablets (400 mg total) by mouth at bedtime. If that amount causes loose stools in the am, switch to 200mg  daily at bedtime.  Dispense: 60 tablet; Refill: 3 - cyclobenzaprine (FLEXERIL) 5 MG tablet; Take 1 tablet (5 mg total) by mouth 3 (three) times daily as needed for muscle spasms.  Dispense: 20 tablet; Refill: 0  4. Regularly irregular pulse rhythym - AMB Referral to Cardio Obstetrics  5. Headache in pregnancy, antepartum - BP has been normotensive, today 127/66 -Carefully counseled patient on the indications of obtaining blood work due to her current symptoms.Patient has been normotensive at prenatal visits, so preeclampsia unlikely, however wanted to rule out potential causes of general malaise. - Protein / creatinine ratio, urine collected -Patient has severe needle phobia and was not open to lab work today. Patient is agreeable to follow-up lab work as medically indicated at return gtt appointment  Encouraged to seek out care at emergency room for urgent and/or emergent concerns if symptoms worsen or progress. Educated to seek care at [facility] for  severe headache or headache unrelieved by analgesic, SOB, and/or chest pain. Patient verbalized understanding  Preterm labor symptoms and general obstetric precautions also reviewed including but not limited to vaginal bleeding,  contractions, leaking of fluid and fetal movement were reviewed in detail with the patient. Please refer to After Visit Summary for other counseling recommendations.   Return in about 1 week (around 07/23/2022) for IN-PERSON, LOB/GTT.  Future Appointments  Date Time Provider Department Center  07/23/2022  9:15 AM [facility] None  07/23/2022 10:35 AM Constant, Gigi Gin, MD [facility] None    Corlis Hove, NP

## 2022-07-16 NOTE — Progress Notes (Signed)
Pt presents for ROB visit. Pt c/o sciatic and is requesting Rx. Pt also c/o heart palpations for the duration of her pregnancy. Pt symptoms present she feels "extremely exhausted, light headed and short of breathe. Pt missed appt with cardiologist and has been unable to reschedule.

## 2022-07-17 LAB — PROTEIN / CREATININE RATIO, URINE
Creatinine, Urine: 239.4 mg/dL
Protein, Ur: 19.4 mg/dL
Protein/Creat Ratio: 81 mg/g creat (ref 0–200)

## 2022-07-20 ENCOUNTER — Ambulatory Visit: Payer: Medicaid Other | Admitting: Licensed Clinical Social Worker

## 2022-07-23 ENCOUNTER — Ambulatory Visit (INDEPENDENT_AMBULATORY_CARE_PROVIDER_SITE_OTHER): Payer: Medicaid Other | Admitting: Licensed Clinical Social Worker

## 2022-07-23 ENCOUNTER — Ambulatory Visit (INDEPENDENT_AMBULATORY_CARE_PROVIDER_SITE_OTHER): Payer: Medicaid Other | Admitting: Obstetrics and Gynecology

## 2022-07-23 ENCOUNTER — Other Ambulatory Visit: Payer: Medicaid Other

## 2022-07-23 VITALS — BP 103/68 | HR 103 | Wt 157.0 lb

## 2022-07-23 DIAGNOSIS — F32A Depression, unspecified: Secondary | ICD-10-CM

## 2022-07-23 DIAGNOSIS — O99340 Other mental disorders complicating pregnancy, unspecified trimester: Secondary | ICD-10-CM | POA: Diagnosis not present

## 2022-07-23 DIAGNOSIS — O219 Vomiting of pregnancy, unspecified: Secondary | ICD-10-CM

## 2022-07-23 DIAGNOSIS — Z3480 Encounter for supervision of other normal pregnancy, unspecified trimester: Secondary | ICD-10-CM

## 2022-07-23 DIAGNOSIS — M5431 Sciatica, right side: Secondary | ICD-10-CM | POA: Insufficient documentation

## 2022-07-23 MED ORDER — METOCLOPRAMIDE HCL 5 MG PO TABS: 5.0000 mg | ORAL_TABLET | Freq: Four times a day (QID) | ORAL | 1 refills | Status: DC | PRN

## 2022-07-23 NOTE — Patient Instructions (Addendum)
EATING with nausea and vomiting in pregnancy  Take medication  DICLEGIS: 1 tab in the AM, 1 tab in the afternoon and 2 tabs at bedtime REGLAN/Metoclopramide: take 5 mg every 6 hours- 30 minutes before meals. Add protein to your foods.  For example, eat smoothies with protein powder or peanut butter.  Eat small and frequent meals.

## 2022-07-23 NOTE — Progress Notes (Signed)
ROB, declined TDap vaccine, she ate Apple Sauce this Morning and refused to rescheduled GTT. C/o Rx does not help pain.

## 2022-07-23 NOTE — Progress Notes (Signed)
PRENATAL VISIT NOTE  Subjective:  Nicole Harris is a 25 y.o. [phone] at [redacted]w[redacted]d being seen today for ongoing prenatal care.  She is currently monitored for the following issues for this low-risk pregnancy and has Hx of delivery by vacuum extraction, currently pregnant; History of depression; Pelvic floor dysfunction in female; Supervision of other normal pregnancy, antepartum; Regularly irregular pulse rhythym; and Low grade squamous intraepithelial lesion (LGSIL) on cervical Pap smear on their problem list.  Patient reports nausea.  Contractions: Not present. Vag. Bleeding: None.  Movement: Present. Denies leaking of fluid.   N/V worse with eating meats, veggies, warm foods and drinks. She does eat, but in small meals, fruits and potatoes mostly. Diclegis does help. Scopolamine patch burns. Phenergan has not helped in the past.   Pt ate applesauce this morning. Still wants to take her glucola today.   Pt has worse sciatic pain on R side and travels down to knee or foot. Flexeril does not help.  The following portions of the patient's history were reviewed and updated as appropriate: allergies, current medications, past family history, past medical history, past social history, past surgical history and problem list.   Objective:   Vitals:   07/23/22 0927  BP: 103/68  Pulse: (!) 103  Weight: 157 lb (71.2 kg)    Fetal Status: Fetal Heart Rate (bpm): 142   Movement: Present     General:  Alert, oriented and cooperative. Patient is in no acute distress.  Skin: Skin is warm and dry. No rash noted.   Cardiovascular: Normal heart rate noted  Respiratory: Normal respiratory effort, no problems with respiration noted  Abdomen: Soft, gravid, appropriate for gestational age.  Pain/Pressure: Present     Pelvic: Cervical exam deferred        Extremities: Normal range of motion.  Edema: Trace  Mental Status: Normal mood and affect. Normal behavior. Normal judgment and thought content.    Assessment and Plan:  Pregnancy: [id number] at [redacted]w[redacted]d      Problem  Sciatica of Right Side   A/w pregnancy.  Flexeril not helpful.  Started home exercises as of 07/23/22    Regularly Irregular Pulse Rhythym   Was referred for workup, but no showed. Pt declines concerns on 07/23/22.   Supervision of Other Normal Pregnancy, Antepartum    Nursing Staff Provider  Office Location  [facility] Dating  LMP  [facility] Model [X]  Traditional [ ]  Centering [ ]  Mom-Baby Dyad    Language  English  Anatomy  Limited, but Normal. F/U Scheduled  Flu Vaccine  Declined  Genetic/Carrier Screen  NIPS:   Neg AFP:   Neg Horizon: Neg  TDaP Vaccine   Declined 07/23/2022  Hgb A1C or  GTT Early 5.3 [ ]  1 hr gtt on 07/23/22  COVID Vaccine Declined    LAB RESULTS   Rhogam   Blood Type O/Positive/-- (04/28 1145)   Baby Feeding Plan BREAST Antibody Negative (04/28 1145)  Contraception NONE Rubella 1.64 (04/28 1145)  Circumcision YES IF BOY RPR Non Reactive (04/28 1145)   Pediatrician  [facility] HBsAg Negative (04/28 1145)   Support Person Ashaun Wallington HCVAb   Prenatal Classes No  HIV Non Reactive (04/28 1145)     BTL Consent  GBS   (For PCN allergy, check sensitivities)   VBAC Consent  Pap        DME Rx 12-17-1970 ] BP cuff 12-17-1970 ] Weight Scale Waterbirth  [ ]  Class [ ]  Consent [ ]  CNM  visit  PHQ9 & GAD7 Arly.Keller ] new OB 1/4 Arly.Keller ] 28 weeks  [  ] 36 weeks Induction  [ ]  Orders Entered [ ] Foley Y/N    Nausea and Vomiting in Pregnancy   On Diclegis 10-10 4 tabs daily, Reglan 5 mg q6 hrs as of 07/23/22.   Cannot tolerate phenergan and scopolamine.     Orders Placed This Encounter  Procedures   RPR   CBC   HIV antibody (with reflex)   Glucose Tolerance, 1 Hour     Awaiting glucola. Preterm labor symptoms and general obstetric precautions including but not limited to vaginal bleeding, contractions, leaking of fluid and fetal movement were reviewed in detail with the patient. Please refer to After Visit  Summary for other counseling recommendations.    Future Appointments  Date Time Provider Department Center  07/23/2022 10:35 AM Constant, 07/25/22, MD [facility] None  07/23/2022 11:00 AM Gigi Gin, LCSW [facility] None    07/25/2022, DO   Attestation of Attending Supervision of Advanced Practitioner (CNM/NP): Evaluation and management procedures were performed by the Advanced Practitioner under my supervision and collaboration.  I have reviewed the Advanced Practitioner's note and chart, and I agree with the management and plan.  Peggy Constant 07/23/2022 11:03 AM

## 2022-07-24 LAB — CBC
Hematocrit: 29.1 % — ABNORMAL LOW (ref 34.0–46.6)
Hemoglobin: 9.3 g/dL — ABNORMAL LOW (ref 11.1–15.9)
MCH: 25.9 pg — ABNORMAL LOW (ref 26.6–33.0)
MCHC: 32 g/dL (ref 31.5–35.7)
MCV: 81 fL (ref 79–97)
Platelets: 249 10*3/uL (ref 150–450)
RBC: 3.59 x10E6/uL — ABNORMAL LOW (ref 3.77–5.28)
RDW: 12.6 % (ref 11.7–15.4)
WBC: 9.5 10*3/uL (ref 3.4–10.8)

## 2022-07-24 LAB — HIV ANTIBODY (ROUTINE TESTING W REFLEX): HIV Screen 4th Generation wRfx: NONREACTIVE

## 2022-07-24 LAB — GLUCOSE TOLERANCE, 1 HOUR: Glucose, 1Hr PP: 171 mg/dL (ref 70–199)

## 2022-07-24 LAB — RPR: RPR Ser Ql: NONREACTIVE

## 2022-07-24 NOTE — BH Specialist Note (Signed)
Integrated Behavioral Health Initial In-Person Visit  MRN: [id number] Name: Nicole Harris  Number of Integrated Behavioral Health Clinician visits: 1- Initial Visit  Session Start time: 1050    Session End time: 1120  Total time in minutes: 30 In person at [facility]   Types of Service: Individual psychotherapy  Interpretor:No. Interpretor Name and Language: none   Warm Hand Off Completed.        Subjective: Nicole Harris is a 26 y.o. female accompanied by n/a Patient was referred by Dr. Jolayne Panther for history of depression . Patient reports the following symptoms/concerns: Major depressive disorder  Duration of problem: over one year; Severity of problem: mild  Objective: Mood: good  and Affect: Appropriate Risk of harm to self or others: No plan to harm self or others  Life Context: Family and Social: Lives with father of baby and children School/Work: [employer]  Self-Care: n/a Life Changes: new pregnancy  Patient and/or Family's Strengths/Protective Factors: Concrete supports in place (healthy food, safe environments, etc.)  Goals Addressed: Patient will: Reduce symptoms of: depression Increase knowledge and/or ability of: coping skills  Demonstrate ability to: Increase adequate support systems for patient/family  Progress towards Goals: Ongoing  Interventions: Interventions utilized: Supportive Counseling  Standardized Assessments completed: PHQ 9  Patient and/or Family Response: Ms. Ballester responded well to in person ibh visit. Ms. Esteban reports depressed mood, relationship conflict, crying, difficultly sleeping. Ms. Tierney reports partner is has been support for quite some time.   Assessment: Patient currently experiencing depression affecting pregnancy.   Patient may benefit from integrated behavioral health.  Plan: Follow up with behavioral health clinician on : 08/08/2022 Behavioral recommendations: Prioritize rest, develop coparenting  plan with father of child, keep medical appts, take prescribed medication as directed, engage in self care. Mindfulness and relaxation techniques.  Referral(s): Integrated [employer] (In Clinic) "From scale of 1-10, how likely are you to follow plan?":    Gwyndolyn Saxon, LCSW

## 2022-07-24 NOTE — BH Specialist Note (Deleted)
Integrated Behavioral Health Follow Up In-Person Visit  MRN: [id number] Name: Nicole Harris  Number of Integrated Behavioral Health Clinician visits: No data recorded Session Start time: No data recorded  Session End time: No data recorded Total time in minutes: No data recorded  Types of Service: {CHL AMB TYPE OF SERVICE:[phone]}  Interpretor:{yes YF:[id number]} Interpretor Name and Language: ***  Subjective: Nicole Harris is a 26 y.o. female accompanied by {Patient accompanied by:[phone]} Patient was referred by *** for ***. Patient reports the following symptoms/concerns: *** Duration of problem: ***; Severity of problem: {Mild/Moderate/Severe:20260}  Objective: Mood: {BHH MOOD:22306} and Affect: {BHH AFFECT:22307} Risk of harm to self or others: {CHL AMB BH Suicide Current Mental Status:21022748}  Life Context: Family and Social: *** School/Work: *** Self-Care: *** Life Changes: ***  Patient and/or Family's Strengths/Protective Factors: {CHL AMB BH PROTECTIVE FACTORS:[phone]}  Goals Addressed: Patient will:  Reduce symptoms of: {IBH Symptoms:21014056}   Increase knowledge and/or ability of: {IBH Patient Tools:21014057}   Demonstrate ability to: {IBH Goals:21014053}  Progress towards Goals: {CHL AMB BH PROGRESS TOWARDS GOALS:[phone]}  Interventions: Interventions utilized:  {IBH Interventions:21014054} Standardized Assessments completed: {IBH Screening Tools:21014051}  Patient and/or Family Response: ***  Patient Centered Plan: Patient is on the following Treatment Plan(s): *** Assessment: Patient currently experiencing ***.   Patient may benefit from ***.  Plan: Follow up with behavioral health clinician on : *** Behavioral recommendations: *** Referral(s): {IBH Referrals:21014055} "From scale of 1-10, how likely are you to follow plan?": ***  Gwyndolyn Saxon, LCSW

## 2022-07-25 ENCOUNTER — Encounter: Payer: Self-pay | Admitting: Obstetrics and Gynecology

## 2022-07-25 ENCOUNTER — Other Ambulatory Visit: Payer: Self-pay | Admitting: Emergency Medicine

## 2022-07-26 ENCOUNTER — Telehealth: Payer: Self-pay | Admitting: Emergency Medicine

## 2022-07-27 NOTE — Telephone Encounter (Signed)
Discussed repeat glucola.

## 2022-08-02 ENCOUNTER — Other Ambulatory Visit: Payer: Medicaid Other

## 2022-08-02 VITALS — BP 97/54 | HR 90

## 2022-08-02 DIAGNOSIS — Z3480 Encounter for supervision of other normal pregnancy, unspecified trimester: Secondary | ICD-10-CM

## 2022-08-02 NOTE — Progress Notes (Signed)
Pt presents for 3hr Gtt. At 9:27 am, this RN was asked to assist patient from lobby to exam room. Pt appears pale and reports not feeling well. Pt escorted by RN and CMA to exam room to rest in lying position, with legs elevated.   9:30 am- BP 100/55, P 95. Pt AOx4.  9:45am -Pt resting with eyes closed, lying down  on left side with legs elevated, getting blood draw. Reports lightheadness due to hunger but otherwise stable.   9:52am- BP 97/54, P 90. Pt AOx4  Call bell demonstrated, Continues to observe.

## 2022-08-03 ENCOUNTER — Telehealth: Payer: Self-pay

## 2022-08-03 LAB — GESTATIONAL GLUCOSE TOLERANCE
Glucose, Fasting: 90 mg/dL (ref 70–94)
Glucose, GTT - 1 Hour: 176 mg/dL (ref 70–179)
Glucose, GTT - 2 Hour: 149 mg/dL (ref 70–154)
Glucose, GTT - 3 Hour: 85 mg/dL (ref 70–139)

## 2022-08-03 NOTE — Telephone Encounter (Signed)
Returned call, to discuss results

## 2022-08-06 ENCOUNTER — Encounter: Payer: Self-pay | Admitting: Obstetrics and Gynecology

## 2022-08-08 ENCOUNTER — Ambulatory Visit: Payer: Medicaid Other | Admitting: Licensed Clinical Social Worker

## 2022-08-08 ENCOUNTER — Encounter: Payer: Medicaid Other | Admitting: Obstetrics and Gynecology

## 2022-08-10 ENCOUNTER — Ambulatory Visit (INDEPENDENT_AMBULATORY_CARE_PROVIDER_SITE_OTHER): Payer: Medicaid Other | Admitting: Cardiology

## 2022-08-10 ENCOUNTER — Encounter: Payer: Self-pay | Admitting: Cardiology

## 2022-08-10 VITALS — BP 102/44 | HR 93 | Ht 64.0 in | Wt 158.4 lb

## 2022-08-10 DIAGNOSIS — R0602 Shortness of breath: Secondary | ICD-10-CM

## 2022-08-10 DIAGNOSIS — R002 Palpitations: Secondary | ICD-10-CM | POA: Diagnosis not present

## 2022-08-10 DIAGNOSIS — R42 Dizziness and giddiness: Secondary | ICD-10-CM

## 2022-08-10 DIAGNOSIS — I493 Ventricular premature depolarization: Secondary | ICD-10-CM

## 2022-08-10 DIAGNOSIS — R55 Syncope and collapse: Secondary | ICD-10-CM

## 2022-08-10 DIAGNOSIS — I498 Other specified cardiac arrhythmias: Secondary | ICD-10-CM

## 2022-08-10 NOTE — Progress Notes (Signed)
Cardio-Obstetrics Clinic  New Evaluation  Date:  08/10/2022   ID:  Nicole Harris, DOB 1996/11/02, MRN [id number]  PCP:  Patient, No Pcp Per   [facility] Providers Cardiologist:  Berniece Salines, DO  Electrophysiologist:  None       Referring MD: Johnston Ebbs, NP   Chief Complaint: I am having a lot of palpitations  History of Present Illness:    Nicole Harris is a 26 y.o. female [G4P2012] who is being seen today for the evaluation of irregular heartbeat at the request of Johnston Ebbs, NP.  Medical history including ADHD, bipolar disorder, anxiety, depression here to be evaluated for irregular heartbeat. She tells me that she is having multiple palpitations during the day.  She feel like her heart is irregular.  At times when she feels them at last form 5 to 10 minutes or it could be longer.  Most times she does go and lie down and it passes.  During this time she does feel short of breath.  She is concerned about this.  She has not passed out.  There is no chest pain.   Prior CV Studies Reviewed: The following studies were reviewed today: None  Past Medical History:  Diagnosis Date   ADHD (attention deficit hyperactivity disorder)    Anemia    Anxiety    Bipolar disorder ([place])    Depression    Headache    Nausea/vomiting in pregnancy 07/10/2018   Stress incontinence in female 02/16/2021    Past Surgical History:  Procedure Laterality Date   ADENOIDECTOMY     TONGUE SURGERY     TONSILLECTOMY        OB History     Gravida  4   Para  2   Term  2   Preterm      AB  1   Living  2      SAB      IAB  1   Ectopic      Multiple  0   Live Births  2               Current Medications: Current Meds  Medication Sig   cyclobenzaprine (FLEXERIL) 5 MG tablet Take 1 tablet (5 mg total) by mouth 3 (three) times daily as needed for muscle spasms.   Doxylamine-Pyridoxine (DICLEGIS) 10-10 MG TBEC TAKE 2 TABLETS BY MOUTH AT BEDTIME. IF NEEDED,  ADD ANOTHER TABLET IN THE MORNING. IF NEEDED ADD ANOTHER TABLET IN THE AFTERNOON. MAX 4 TABS/DAY   [DISCONTINUED] DICLEGIS 10-10 MG TBEC Take 2 tabs at bedtime. If needed, add another tab in the morning. If needed, add another tab in the afternoon, up to 4 tabs/day.     Allergies:   Latex and Shellfish allergy   Social History   Socioeconomic History   Marital status: Significant Other    Spouse name: Not on file   Number of children: 1   Years of education: Not on file   Highest education level: Not on file  Occupational History   Occupation: unemployed  Tobacco Use   Smoking status: Former    Types: Cigarettes    Passive exposure: Past   Smokeless tobacco: Never  Vaping Use   Vaping Use: Never used  Substance and Sexual Activity   Alcohol use: Not Currently    Comment: occasionally   Drug use: No   Sexual activity: Yes    Birth control/protection: None  Other Topics Concern   Not on file  Social History Narrative   Not on file   Social Determinants of Health   Financial Resource Strain: Low Risk  (01/30/2019)   Overall Financial Resource Strain (CARDIA)    Difficulty of Paying Living Expenses: Not hard at all  Food Insecurity: Food Insecurity Present (01/30/2019)   Hunger Vital Sign    Worried About Running Out of Food in the Last Year: Sometimes true    Ran Out of Food in the Last Year: Never true  Transportation Needs: Unmet Transportation Needs (01/30/2019)   PRAPARE - Hydrologist (Medical): Yes    Lack of Transportation (Non-Medical): Not on file  Physical Activity: Not on file  Stress: No Stress Concern Present (01/30/2019)   [place]    Feeling of Stress : Only a little  Social Connections: Not on file      Family History  Problem Relation Age of Onset   Drug abuse Mother    Healthy Father    Cancer Maternal Grandmother    Thyroid disease Maternal  Grandmother    Ovarian cancer Maternal Grandmother    Diabetes Paternal Grandmother    Hypertension Paternal Grandfather       ROS:   Review of Systems  Constitution: Negative for decreased appetite, fever and weight gain.  HENT: Negative for congestion, ear discharge, hoarse voice and sore throat.   Eyes: Negative for discharge, redness, vision loss in right eye and visual halos.  Cardiovascular: Report palpitation.  Negative for chest pain, dyspnea on exertion, leg swelling, orthopnea. Respiratory: Negative for cough, hemoptysis, shortness of breath and snoring.   Endocrine: Negative for heat intolerance and polyphagia.  Hematologic/Lymphatic: Negative for bleeding problem. Does not bruise/bleed easily.  Skin: Negative for flushing, nail changes, rash and suspicious lesions.  Musculoskeletal: Negative for arthritis, joint pain, muscle cramps, myalgias, neck pain and stiffness.  Gastrointestinal: Negative for abdominal pain, bowel incontinence, diarrhea and excessive appetite.  Genitourinary: Negative for decreased libido, genital sores and incomplete emptying.  Neurological: Negative for brief paralysis, focal weakness, headaches and loss of balance.  Psychiatric/Behavioral: Negative for altered mental status, depression and suicidal ideas.  Allergic/Immunologic: Negative for HIV exposure and persistent infections.    Labs/EKG Reviewed:    EKG:   EKG is was ordered today.  The ekg ordered today demonstrates sinus rhythm, heart rate 93 bpm with arrhythmia and occasional premature ventricular complex.   Recent Labs: 07/23/2022: Hemoglobin 9.3; Platelets 249   Recent Lipid Panel No results found for: "CHOL", "TRIG", "HDL", "CHOLHDL", "LDLCALC", "LDLDIRECT"  Physical Exam:    VS:  BP (!) 102/44 (BP Location: Right Arm, Patient Position: Sitting, Cuff Size: Normal)   Pulse 93   Ht $R'5\' 4"'ZT$  (1.626 m)   Wt 158 lb 6.4 oz (71.8 kg)   LMP 01/12/2022   SpO2 100%   BMI 27.19 kg/m      Wt Readings from Last 3 Encounters:  08/10/22 158 lb 6.4 oz (71.8 kg)  07/23/22 157 lb (71.2 kg)  07/16/22 157 lb 3.2 oz (71.3 kg)     GEN:  Well nourished, well developed in no acute distress HEENT: Normal NECK: No JVD; No carotid bruits LYMPHATICS: No lymphadenopathy CARDIAC: RRR, no murmurs, rubs, gallops RESPIRATORY:  Clear to auscultation without rales, wheezing or rhonchi  ABDOMEN: Soft, non-tender, non-distended MUSCULOSKELETAL:  No edema; No deformity  SKIN: Warm and dry NEUROLOGIC:  Alert and oriented x 3 PSYCHIATRIC:  Normal affect    Risk  Assessment/Risk Calculators:     CARPREG II Risk Prediction Index Score:  1.  The patient's risk for a primary cardiac event is 5%.   Modified World Health Organization [facility]) Classification of Maternal CV Risk   Class I         ASSESSMENT & PLAN:    PVC Palpitation  EKG today showed evidence of PVC with sinus arrhythmia.  I will like to do is get a basic metabolic panel in this patient.  Her last blood work for this was in 2018.  1 to make sure that her electrolytes are not off.  If this is all normal and she still experiencing symptoms I will try low-dose propanolol. In the meantime we will get an echo to rule out any structural abnormalities.  The patient is in agreement with the above plan. The patient left the office in stable condition.  The patient will follow up in 12 weeks.  Patient Instructions  Medication Instructions:  Your physician recommends that you continue on your current medications as directed. Please refer to the Current Medication list given to you today.  *If you need a refill on your cardiac medications before your next appointment, please call your pharmacy*   Lab Work: TODAY: CMET, Mag If you have labs (blood work) drawn today and your tests are completely normal, you will receive your results only by: [facility] (if you have MyChart) OR A paper copy in the mail If you have any lab test  that is abnormal or we need to change your treatment, we will call you to review the results.   Testing/Procedures: Your physician has requested that you have an echocardiogram. Echocardiography is a painless test that uses sound waves to create images of your heart. It provides your doctor with information about the size and shape of your heart and how well your heart's chambers and valves are working. This procedure takes approximately one hour. There are no restrictions for this procedure.    Follow-Up: At [facility], you and your health needs are our priority.  As part of our continuing mission to provide you with exceptional heart care, we have created designated Provider Care Teams.  These Care Teams include your primary Cardiologist (physician) and Advanced Practice Providers (APPs -  Physician Assistants and Nurse Practitioners) who all work together to provide you with the care you need, when you need it.  We recommend signing up for the patient portal called "MyChart".  Sign up information is provided on this After Visit Summary.  MyChart is used to connect with patients for Virtual Visits (Telemedicine).  Patients are able to view lab/test results, encounter notes, upcoming appointments, etc.  Non-urgent messages can be sent to your provider as well.   To learn more about what you can do with MyChart, go to [email or web address].    Your next appointment:   12 week(s)  The format for your next appointment:   In Person  Provider:   Berniece Salines, DO [address], [place], [place] [address]     Other Instructions   Important Information About Sugar         Dispo:  Return in about 12 weeks (around 11/02/2022).   Medication Adjustments/Labs and Tests Ordered: Current medicines are reviewed at length with the patient today.  Concerns regarding medicines are outlined above.  Tests Ordered: Orders Placed This Encounter  Procedures   Comp Met (CMET)   Magnesium    EKG 12-Lead   ECHOCARDIOGRAM COMPLETE  Medication Changes: No orders of the defined types were placed in this encounter.

## 2022-08-10 NOTE — Patient Instructions (Addendum)
Medication Instructions:  Your physician recommends that you continue on your current medications as directed. Please refer to the Current Medication list given to you today.  *If you need a refill on your cardiac medications before your next appointment, please call your pharmacy*   Lab Work: TODAY: CMET, Mag If you have labs (blood work) drawn today and your tests are completely normal, you will receive your results only by: MyChart Message (if you have MyChart) OR A paper copy in the mail If you have any lab test that is abnormal or we need to change your treatment, we will call you to review the results.   Testing/Procedures: Your physician has requested that you have an echocardiogram. Echocardiography is a painless test that uses sound waves to create images of your heart. It provides your doctor with information about the size and shape of your heart and how well your heart's chambers and valves are working. This procedure takes approximately one hour. There are no restrictions for this procedure.    Follow-Up: At [facility], you and your health needs are our priority.  As part of our continuing mission to provide you with exceptional heart care, we have created designated Provider Care Teams.  These Care Teams include your primary Cardiologist (physician) and Advanced Practice Providers (APPs -  Physician Assistants and Nurse Practitioners) who all work together to provide you with the care you need, when you need it.  We recommend signing up for the patient portal called "MyChart".  Sign up information is provided on this After Visit Summary.  MyChart is used to connect with patients for Virtual Visits (Telemedicine).  Patients are able to view lab/test results, encounter notes, upcoming appointments, etc.  Non-urgent messages can be sent to your provider as well.   To learn more about what you can do with MyChart, go to [email or web address].    Your next appointment:   12  week(s)  The format for your next appointment:   In Person  Provider:   Thomasene Ripple, DO [address], [place], [place] [address]     Other Instructions   Important Information About Sugar

## 2022-08-20 IMAGING — US US MFM OB COMP +14 WKS
1 series · 16 of 28 positions shown · non-contrast
Comparison: none

[Series 1: us mfm ob comp +14 wks · 60 acquisitions, 16 frames shown]
[im 1/60]
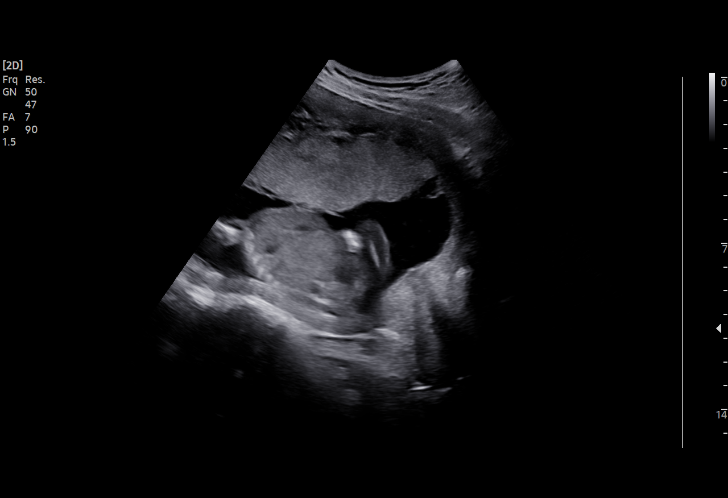
[im 5/60]
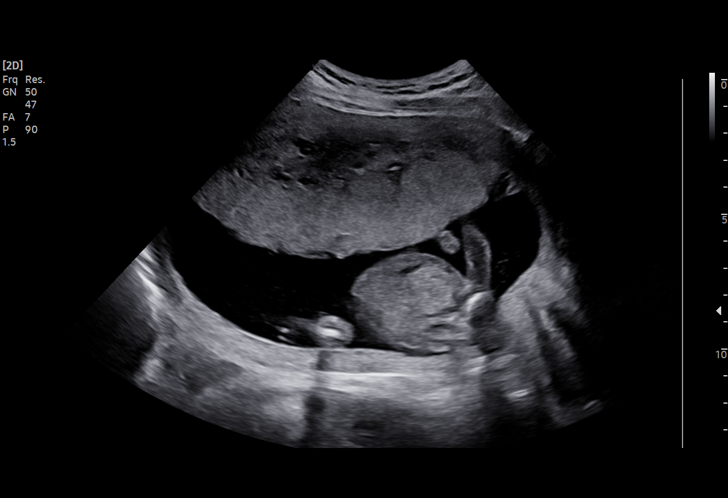
[im 9/60]
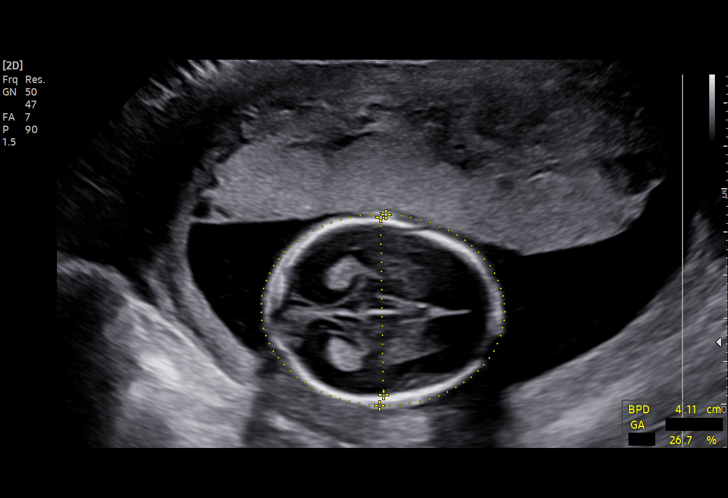
[im 14/60]
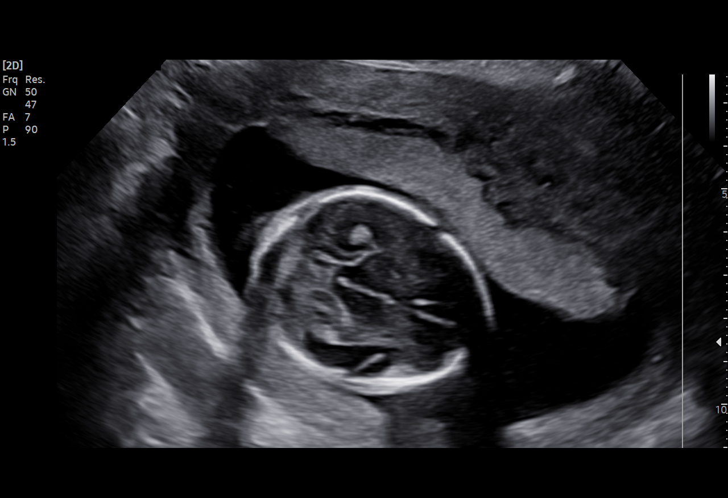
[im 16/60]
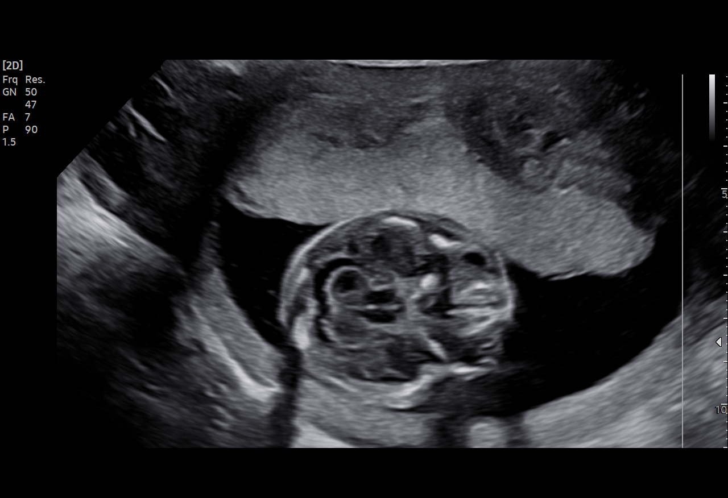
[im 20/60]
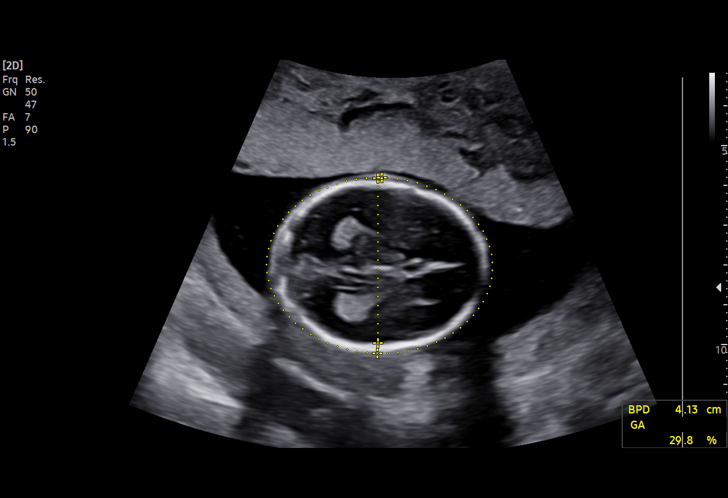
[im 25/60]
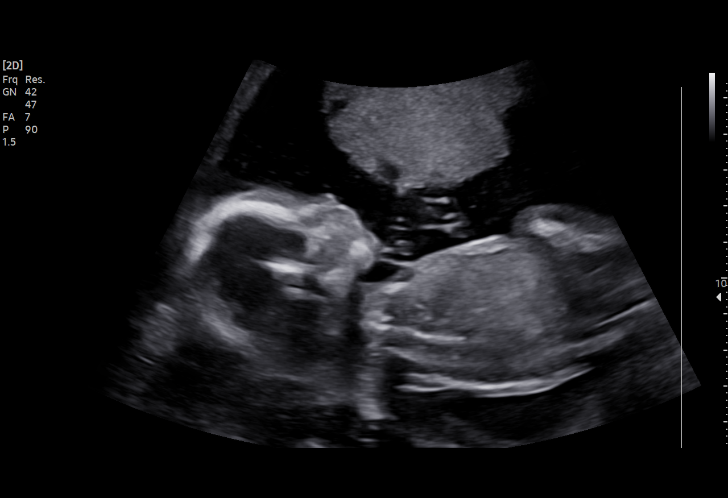
[im 29/60]
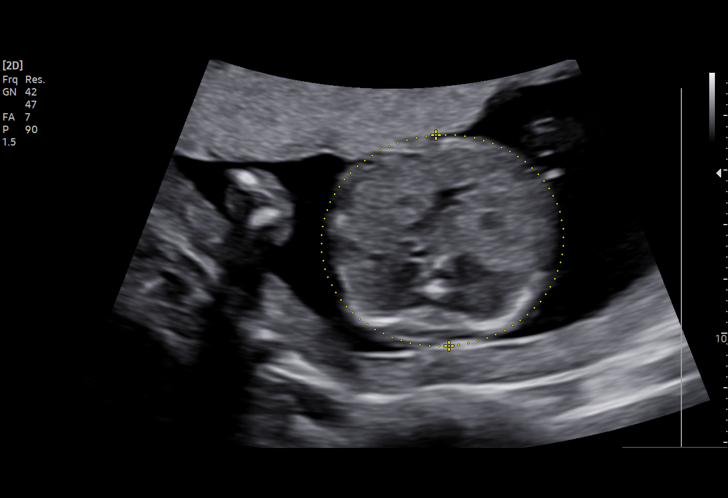
[im 31/60]
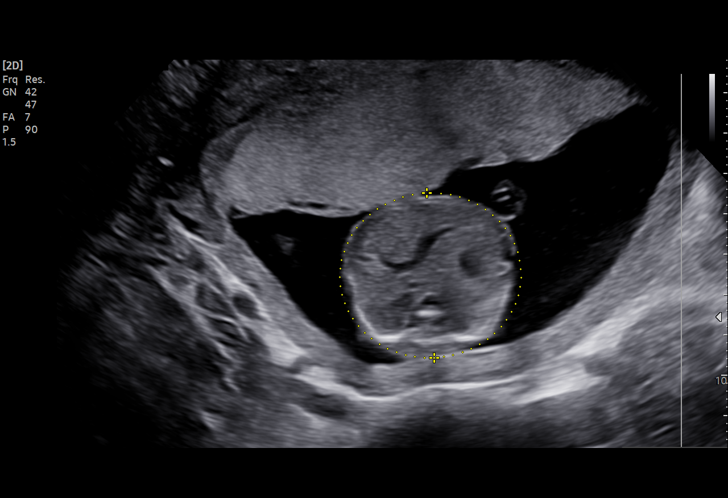
[im 35/60]
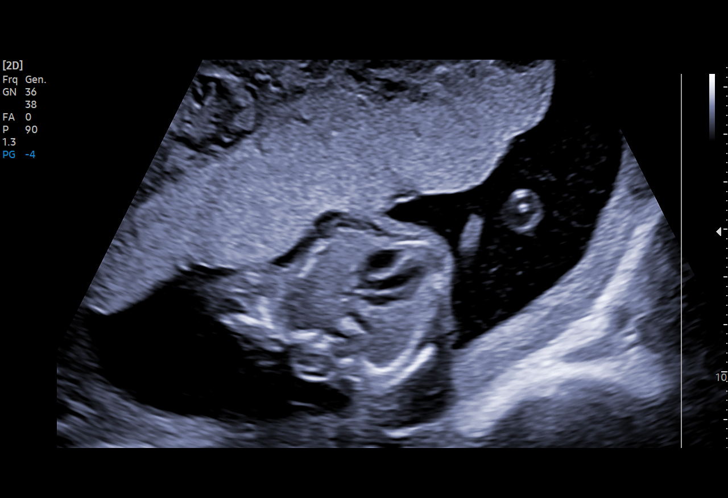
[im 40/60]
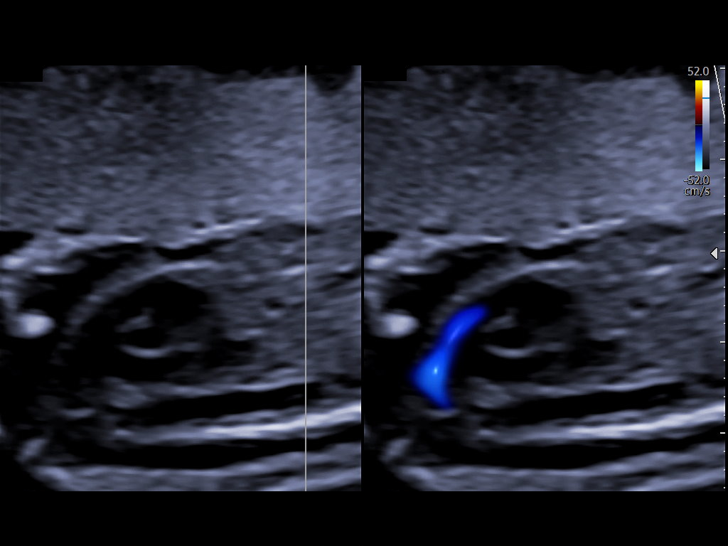
[im 44/60]
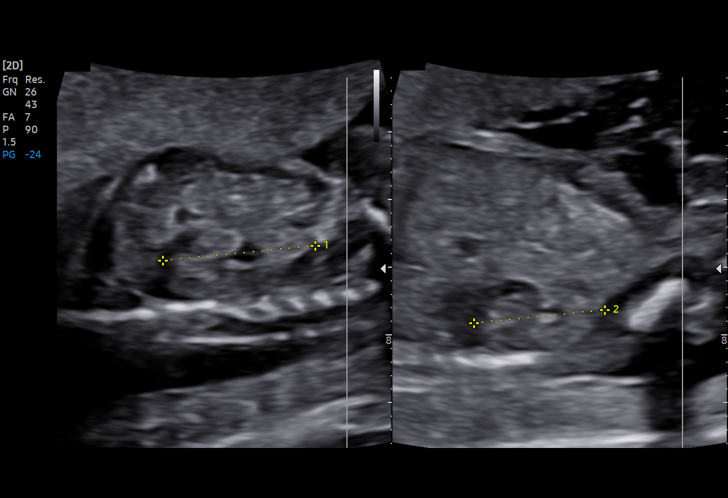
[im 46/60]
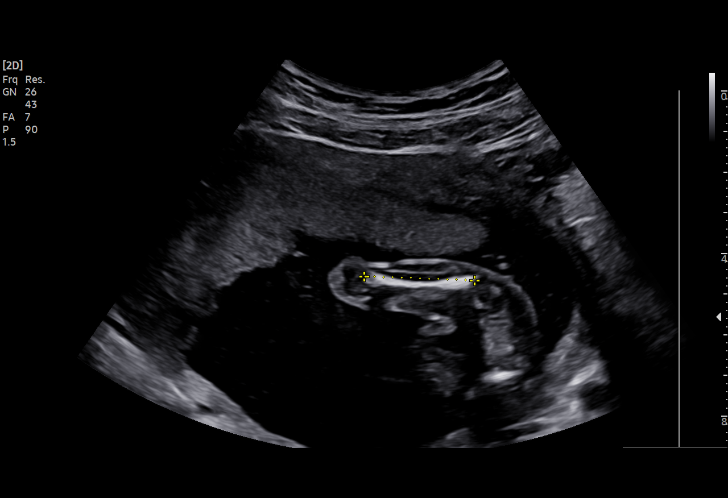
[im 51/60]
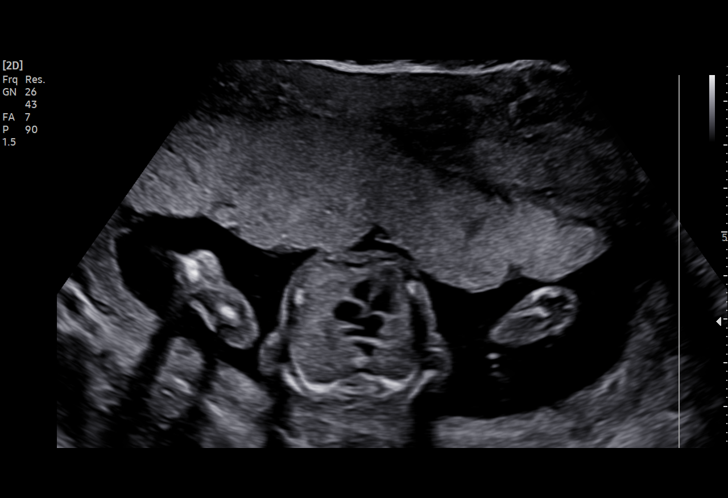
[im 55/60]
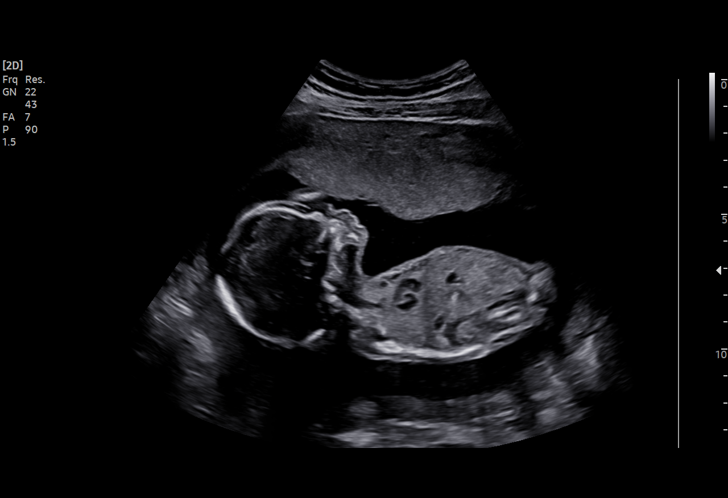
[im 60/60]
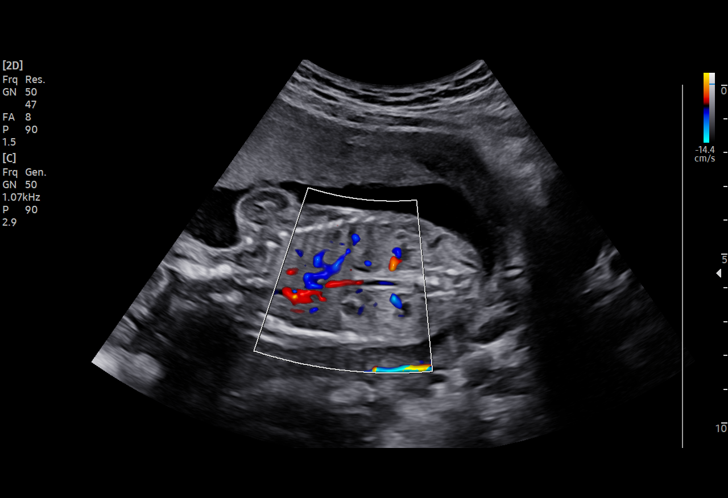

[16 of 28 positions shown; findings below may reference images not displayed]

CNM

 1  US MFM OB COMP + 14 WK                76805.01    LORENZ JUMPER

Indications

 19 weeks gestation of pregnancy
 Encounter for antenatal screening for
 malformations
 Low Risk NIPS

## 2022-08-22 ENCOUNTER — Encounter: Payer: Self-pay | Admitting: Obstetrics and Gynecology

## 2022-08-22 ENCOUNTER — Ambulatory Visit (INDEPENDENT_AMBULATORY_CARE_PROVIDER_SITE_OTHER): Payer: Medicaid Other | Admitting: Obstetrics and Gynecology

## 2022-08-22 VITALS — BP 119/56 | HR 104 | Wt 159.1 lb

## 2022-08-22 DIAGNOSIS — Z3480 Encounter for supervision of other normal pregnancy, unspecified trimester: Secondary | ICD-10-CM

## 2022-08-22 NOTE — Progress Notes (Signed)
Pt presents for ROB. Pt went to cardiologist 8/11 and has questions about lab results.

## 2022-08-22 NOTE — Progress Notes (Signed)
Subjective:  Nicole Harris is a 26 y.o. [phone] at [redacted]w[redacted]d being seen today for ongoing prenatal care.  She is currently monitored for the following issues for this low-risk pregnancy and has Nausea and vomiting in pregnancy; Hx of delivery by vacuum extraction, currently pregnant; History of depression; Supervision of other normal pregnancy, antepartum; Regularly irregular pulse rhythym; Low grade squamous intraepithelial lesion (LGSIL) on cervical Pap smear; and Sciatica of right side on their problem list.  Patient reports general discomforts of pregnancy.  Contractions: Not present. Vag. Bleeding: None.  Movement: Present. Denies leaking of fluid.   The following portions of the patient's history were reviewed and updated as appropriate: allergies, current medications, past family history, past medical history, past social history, past surgical history and problem list. Problem list updated.  Objective:   Vitals:   08/22/22 0847  BP: (!) 119/56  Pulse: (!) 104  Weight: 159 lb 1.6 oz (72.2 kg)    Fetal Status: Fetal Heart Rate (bpm): 134   Movement: Present     General:  Alert, oriented and cooperative. Patient is in no acute distress.  Skin: Skin is warm and dry. No rash noted.   Cardiovascular: Normal heart rate noted  Respiratory: Normal respiratory effort, no problems with respiration noted  Abdomen: Soft, gravid, appropriate for gestational age. Pain/Pressure: Present     Pelvic:  Cervical exam deferred        Extremities: Normal range of motion.  Edema: None  Mental Status: Normal mood and affect. Normal behavior. Normal judgment and thought content.   Urinalysis:      Assessment and Plan:  Pregnancy: [id number] at [redacted]w[redacted]d  1. Supervision of other normal pregnancy, antepartum Stable Has been seen by OB Cards, see their note  Preterm labor symptoms and general obstetric precautions including but not limited to vaginal bleeding, contractions, leaking of fluid and fetal  movement were reviewed in detail with the patient. Please refer to After Visit Summary for other counseling recommendations.  Return in about 2 weeks (around 09/05/2022) for OB visit, face to face, any provider.   Hermina Staggers, MD

## 2022-08-22 NOTE — Patient Instructions (Signed)

## 2022-08-23 ENCOUNTER — Encounter: Payer: Medicaid Other | Admitting: Obstetrics and Gynecology

## 2022-08-27 ENCOUNTER — Encounter: Payer: Self-pay | Admitting: Obstetrics and Gynecology

## 2022-08-28 ENCOUNTER — Ambulatory Visit (HOSPITAL_COMMUNITY): Payer: Medicaid Other | Attending: Cardiovascular Disease

## 2022-08-28 DIAGNOSIS — I493 Ventricular premature depolarization: Secondary | ICD-10-CM | POA: Insufficient documentation

## 2022-08-28 DIAGNOSIS — R0602 Shortness of breath: Secondary | ICD-10-CM | POA: Diagnosis not present

## 2022-08-28 LAB — ECHOCARDIOGRAM COMPLETE
Area-P 1/2: 4.49 cm2
S' Lateral: 3.4 cm

## 2022-09-06 ENCOUNTER — Ambulatory Visit (INDEPENDENT_AMBULATORY_CARE_PROVIDER_SITE_OTHER): Payer: Medicaid Other | Admitting: Licensed Clinical Social Worker

## 2022-09-06 ENCOUNTER — Encounter: Payer: Self-pay | Admitting: Obstetrics and Gynecology

## 2022-09-06 ENCOUNTER — Ambulatory Visit (INDEPENDENT_AMBULATORY_CARE_PROVIDER_SITE_OTHER): Payer: Medicaid Other | Admitting: Obstetrics and Gynecology

## 2022-09-06 VITALS — BP 124/72 | HR 99 | Wt 155.8 lb

## 2022-09-06 DIAGNOSIS — O99340 Other mental disorders complicating pregnancy, unspecified trimester: Secondary | ICD-10-CM

## 2022-09-06 DIAGNOSIS — Z3480 Encounter for supervision of other normal pregnancy, unspecified trimester: Secondary | ICD-10-CM

## 2022-09-06 DIAGNOSIS — F32A Depression, unspecified: Secondary | ICD-10-CM | POA: Diagnosis not present

## 2022-09-06 DIAGNOSIS — O219 Vomiting of pregnancy, unspecified: Secondary | ICD-10-CM

## 2022-09-06 DIAGNOSIS — Z3A33 33 weeks gestation of pregnancy: Secondary | ICD-10-CM

## 2022-09-06 DIAGNOSIS — Z3483 Encounter for supervision of other normal pregnancy, third trimester: Secondary | ICD-10-CM

## 2022-09-06 NOTE — Progress Notes (Addendum)
LOW-RISK PREGNANCY OFFICE VISIT Patient name: Nicole Harris MRN [id number]  Date of birth: 1996/07/30 Chief Complaint:   Routine Prenatal Visit  History of Present Illness:   Nicole Harris is a 26 y.o. [phone] female at [redacted]w[redacted]d with an Estimated Date of Delivery: 10/19/22 being seen today for ongoing management of a low-risk pregnancy.  Today she reports nausea, vomiting, and fear of baby being too large like the first baby . She reports her N/V is so severe she can barely make it through the day. She reports weakness and dizziness for most of the day. She reports "smoking a few puffs of marijuana" to help stimulate her appetite and "to be able to keep food and liquids down. I and so afraid I have caused him harm. I only did it, because I wanted to be able to eat, so that he could get stronger while still inside." She also reports she "had to" terminate her last pregnancy last year d/t "extreme" hyperemesis gravidarum. She has not healed emotionally from that trauma. Contractions: Not present. Vag. Bleeding: None.  Movement: Present. denies leaking of fluid. Review of Systems:   Pertinent items are noted in HPI Denies abnormal vaginal discharge w/ itching/odor/irritation, headaches, visual changes, shortness of breath, chest pain, abdominal pain, severe nausea/vomiting, or problems with urination or bowel movements unless otherwise stated above. Pertinent History Reviewed:  Reviewed past medical,surgical, social, obstetrical and family history.  Reviewed problem list, medications and allergies. Physical Assessment:   Vitals:   09/06/22 0957  BP: 124/72  Pulse: 99  Weight: 155 lb 12.8 oz (70.7 kg)  Body mass index is 26.74 kg/m.        Physical Examination:   General appearance: Well appearing, and in no distress  Mental status: Alert, oriented to person, place, and time  Skin: Warm & dry  Cardiovascular: Normal heart rate noted  Respiratory: Normal respiratory effort, no  distress  Abdomen: Soft, gravid, nontender  Pelvic: Cervical exam deferred         Extremities: Edema: None  Fetal Status: Fetal Heart Rate (bpm): 142 Fundal Height: 35 cm Movement: Present    No results found for this or any previous visit (from the past 24 hour(s)).  Assessment & Plan:  1) Low-risk pregnancy [id number] at [redacted]w[redacted]d with an Estimated Date of Delivery: 10/19/22   2) Supervision of other normal pregnancy, antepartum  - [place] MFM OB FOLLOW UP for growth in 3-4 weeks - Seen by Gwyndolyn Saxon, LCSW today to discuss coping mechanisms for dealing with her feelings from the termination she had last year - Patient also voiced concern about having "a really bad experience with the last delivering provider." She never received a call back about her concerns. She does not want to have that happen again. So, she would prefer to have a midwife for her delivery.   3) Nausea and vomiting in pregnancy  - Advised that there are no studies that support the use of marijuana in pregnancy - Informed patient that a portion of the umbilical cord will be sent off for testing (includes drug testing) and a SW consult will be placed - F/U with Gwyndolyn Saxon, LCSW about process in-patient - Patient very tearful expressing fear of "causing him harm."  4)  [redacted] weeks gestation of pregnancy  Meds: No orders of the defined types were placed in this encounter.  Labs/procedures today: IBH  Plan:  Continue routine obstetrical care   Reviewed: Preterm labor symptoms and general obstetric precautions including  but not limited to vaginal bleeding, contractions, leaking of fluid and fetal movement were reviewed in detail with the patient.  All questions were answered. Has home bp cuff. Check bp weekly, let us know if >140/90.   Follow-up: Return in about 2 weeks (around 09/20/2022) for Return OB w/GBS.  Orders Placed This Encounter  Procedures   [place] MFM OB FOLLOW UP   Raelyn Mora MSN, CNM 09/06/2022 1:00  PM

## 2022-09-06 NOTE — Patient Instructions (Signed)

## 2022-09-06 NOTE — BH Specialist Note (Signed)
Integrated Behavioral Health Initial In-Person Visit  MRN: [id number] Name: Nicole Harris  Number of Integrated Behavioral Health Clinician visits: 4 Session Start time:   1130am Session End time: 1154am Total time in minutes: 24 mins in person at [facility]   Types of Service: Individual psychotherapy  Interpretor:No. Interpretor Name and Language: none   Warm Hand Off Completed.        Subjective: Nicole Harris is a 26 y.o. female accompanied by n/a Patient was referred by Nicole Harris for depression. Patient reports the following symptoms/concerns: depressed mood, social isolation, feeling on edge, fatigue, limited family and social support Duration of problem: over one year; Severity of problem: mild  Objective: Mood: Depressed and Affect: Appropriate Risk of harm to self or others: No plan to harm self or others  Life Context: Family and Social: lives with partner and children School/Work: [employer]  Self-Care: n/a Life Changes: new pregnancy  Patient and/or Family's Strengths/Protective Factors: Concrete supports in place (healthy food, safe environments, etc.)  Goals Addressed: Patient will: Reduce symptoms of: depression Increase knowledge and/or ability of: coping skills  Demonstrate ability to: Increase adequate support systems for patient/family  Progress towards Goals: Ongoing  Interventions: Interventions utilized: Mindfulness or Management consultant, Supportive Counseling, and Link to [facility]  Standardized Assessments completed: PHQ 9  Patient and/or Family Response: Nicole Harris reports increased nausea and loss of appetite is contributing to depressed mood and fatigue.   Assessment: Patient currently experiencing depression affecting pregnancy.   Patient may benefit from integrated behavioral health.  Plan: Follow up with behavioral health clinician on : 09/27/2022 Behavioral recommendations: mindfulness and grounding technique to  boost mood, rest and delegate task. Engage in self care and participate in a support/peer group Referral(s): Integrated [employer] (In Clinic) "From scale of 1-10, how likely are you to follow plan?":    Nicole Saxon, LCSW

## 2022-09-06 NOTE — Progress Notes (Signed)
Pt reports fetal movement with some pressure. Pt reports difficulty with gaining weight. She states that she has tried multiple medications for nausea with no relief.

## 2022-09-20 ENCOUNTER — Ambulatory Visit (INDEPENDENT_AMBULATORY_CARE_PROVIDER_SITE_OTHER): Payer: Medicaid Other

## 2022-09-20 ENCOUNTER — Other Ambulatory Visit (HOSPITAL_COMMUNITY)
Admission: RE | Admit: 2022-09-20 | Discharge: 2022-09-20 | Disposition: A | Discharge status: Home / Self Care | Payer: Medicaid Other | Source: Ambulatory Visit

## 2022-09-20 VITALS — BP 108/63 | HR 85 | Wt 158.0 lb

## 2022-09-20 DIAGNOSIS — Z3A35 35 weeks gestation of pregnancy: Secondary | ICD-10-CM | POA: Diagnosis present

## 2022-09-20 DIAGNOSIS — Z3480 Encounter for supervision of other normal pregnancy, unspecified trimester: Secondary | ICD-10-CM | POA: Diagnosis present

## 2022-09-20 DIAGNOSIS — Z3483 Encounter for supervision of other normal pregnancy, third trimester: Secondary | ICD-10-CM

## 2022-09-20 MED ORDER — BLOOD PRESSURE KIT DEVI: 1.0000 | 0 refills | Status: DC | PRN

## 2022-09-20 NOTE — Progress Notes (Signed)
LOW-RISK PREGNANCY OFFICE VISIT  Patient name: Nicole Harris MRN [id number]  Date of birth: February 03, 1996 Chief Complaint:   Routine Prenatal Visit  Subjective:   Nicole Harris is a 26 y.o. [phone] female at [redacted]w[redacted]d with an Estimated Date of Delivery: 10/19/22 being seen today for ongoing management of a low-risk pregnancy aeb has Nausea and vomiting in pregnancy; Hx of delivery by vacuum extraction, currently pregnant; History of depression; Supervision of other normal pregnancy, antepartum; Regularly irregular pulse rhythym; Low grade squamous intraepithelial lesion (LGSIL) on cervical Pap smear; and Sciatica of right side on their problem list.  Patient presents today, alone, with  concerns .   Patient reports abdominal cramping or discomfort with movements.  She states she has been having contractions that are every 4 minutes "since before my last appt." She also expresses concern with darkened urine color despite what she feels is adequate water intake.  She also questions if she will be induced at 38 weeks d/t her history of her first baby "getting stuck."  She reports she was induced at 38 weeks with her 2nd d/t this.  Patient reports vaginal concerns including abnormal discharge, leaking of fluid, and bleeding.  Patient endorses fetal movement.  Contractions: Irritability. Vag. Bleeding: None.  Movement: Present.  Reviewed past medical,surgical, social, obstetrical and family history as well as problem list, medications and allergies.  Objective   Vitals:   09/20/22 1000  BP: 108/63  Pulse: 85  Weight: 158 lb (71.7 kg)  Body mass index is 27.12 kg/m.  Total Weight Gain:23 lb (10.4 kg)         Physical Examination:   General appearance: Well appearing, and in no distress  Mental status: Alert, oriented to person, place, and time  Skin: Warm & dry  Cardiovascular: Normal heart rate noted  Respiratory: Normal respiratory effort, no distress  Abdomen: Soft, gravid,  nontender, AGA with Fundal Height: 35 cm  Pelvic: Cervical exam performed  Dilation: Closed Effacement (%): 50 Station: Ballotable Presentation: Undeterminable  Extremities: Edema: None  Fetal Status: Fetal Heart Rate (bpm): 132  Movement: Present   No results found for this or any previous visit (from the past 24 hour(s)).  Assessment & Plan:  Low-risk pregnancy of a 26 y.o., [id number] at [redacted]w[redacted]d with an Estimated Date of Delivery: 10/19/22   1. Supervision of other normal pregnancy, antepartum -Anticipatory guidance for upcoming appts. -Patient to schedule next appt in 1 weeks for an in-person visit. -Discussed availability and candidacy for virtual visit. Patient would like to have option, but states bp cuff broke. -New prescription for cuff sent to pharmacy on file.   2. [redacted] weeks gestation of pregnancy -Addressed complaints and concerns. -Patient informed that induction based on fetal weight would be through MFM discretion. Further informed that this rarely occurs before 38 weeks. -Patient scheduled for [place] and encouraged to speak with MFM about recommendations based on fetal weight. -Reassured that contractions are normal and may increase based on activity and fluid intake.  -Encouraged rest when possible.  -Instructed to monitor urine color in setting of increased intake and if it remains dark or with onset of symptoms plan for testing.       Meds: No orders of the defined types were placed in this encounter.  Labs/procedures today:  Lab Orders         Strep Gp B NAA      Reviewed: Preterm labor symptoms and general obstetric precautions including but not limited to vaginal bleeding,  contractions, leaking of fluid and fetal movement were reviewed in detail with the patient.  All questions were answered.  Follow-up: Return in about 1 week (around 09/27/2022) for [place].  Orders Placed This Encounter  Procedures   Strep Gp B NAA   Maryann Conners MSN, CNM 09/20/2022

## 2022-09-20 NOTE — Progress Notes (Signed)
Pt reports fetal movement with irritability.

## 2022-09-21 LAB — CERVICOVAGINAL ANCILLARY ONLY
Chlamydia: NEGATIVE
Comment: NEGATIVE
Comment: NORMAL
Neisseria Gonorrhea: NEGATIVE

## 2022-09-22 LAB — STREP GP B NAA: Strep Gp B NAA: NEGATIVE

## 2022-09-27 ENCOUNTER — Ambulatory Visit: Payer: Medicaid Other | Admitting: *Deleted

## 2022-09-27 ENCOUNTER — Ambulatory Visit: Payer: Medicaid Other | Attending: Obstetrics and Gynecology

## 2022-09-27 ENCOUNTER — Encounter: Payer: Self-pay | Admitting: Obstetrics and Gynecology

## 2022-09-27 ENCOUNTER — Encounter: Payer: Medicaid Other | Admitting: Certified Nurse Midwife

## 2022-09-27 VITALS — BP 115/56 | HR 83

## 2022-09-27 DIAGNOSIS — Z3689 Encounter for other specified antenatal screening: Secondary | ICD-10-CM | POA: Insufficient documentation

## 2022-09-27 DIAGNOSIS — Z3480 Encounter for supervision of other normal pregnancy, unspecified trimester: Secondary | ICD-10-CM

## 2022-09-27 DIAGNOSIS — Z3A36 36 weeks gestation of pregnancy: Secondary | ICD-10-CM | POA: Diagnosis not present

## 2022-10-04 ENCOUNTER — Ambulatory Visit (INDEPENDENT_AMBULATORY_CARE_PROVIDER_SITE_OTHER): Payer: Medicaid Other | Admitting: Licensed Clinical Social Worker

## 2022-10-04 DIAGNOSIS — F32A Depression, unspecified: Secondary | ICD-10-CM | POA: Diagnosis not present

## 2022-10-04 DIAGNOSIS — O99340 Other mental disorders complicating pregnancy, unspecified trimester: Secondary | ICD-10-CM | POA: Diagnosis not present

## 2022-10-04 NOTE — BH Specialist Note (Signed)
Integrated Behavioral Health via Telemedicine Visit  10/04/2022 Nicole Harris [id number]  Number of [facility] Clinician visits: 5 Session Start time:  945am    Session End time: 1016am Total time in minutes: 30 Via phone per pt request.   Referring Provider: Laury Deep CNM Patient/Family location: Home  [facility] Provider location: [place]  All persons participating in visit: Pt Nicole Harris and LCSW A. Eural Holzschuh  Types of Service: Individual psychotherapy and Telephone visit  I connected with Nicole Harris and/or Nicole Harris n/a via  Telephone or Geologist, engineering  (Video is Care[employer]ility application) and verified that I am speaking with the correct person using two identifiers. Discussed confidentiality: Yes   I discussed the limitations of telemedicine and the availability of in person appointments.  Discussed there is a possibility of technology failure and discussed alternative modes of communication if that failure occurs.  I discussed that engaging in this telemedicine visit, they consent to the provision of behavioral healthcare and the services will be billed under their insurance.  Patient and/or legal guardian expressed understanding and consented to Telemedicine visit: Yes   Presenting Concerns: Patient and/or family reports the following symptoms/concerns: depression affecting pregnancy Duration of problem: over one year ; Severity of problem: mild  Patient and/or Family's Strengths/Protective Factors: Concrete supports in place (healthy food, safe environments, etc.)  Goals Addressed: Patient will:  Reduce symptoms of: depression   Increase knowledge and/or ability of: coping skills   Demonstrate ability to: Increase adequate support systems for patient/family  Progress towards Goals: Ongoing  Interventions: Interventions utilized:  Supportive Counseling Standardized Assessments completed: PHQ 9  Patient  and/or Family Response: Nicole Harris reports partner is not supportive and she is overwhelmed and stress with household tasks. Nicole Harris reports fatigue, difficulty sleeping and depressed mood.   Assessment: Patient currently experiencing depression affecting pregnancy.   Patient may benefit from integrated behavioral health.  Plan: Follow up with behavioral health clinician on : mood check and postpartum visit.  Behavioral recommendations: discuss concerns with partner for added support, delegate task to prevent burnout, prioritze rest, develop plan and/or smart goals to become self sufficient.  Referral(s): [place] (In Clinic)  I discussed the assessment and treatment plan with the patient and/or parent/guardian. They were provided an opportunity to ask questions and all were answered. They agreed with the plan and demonstrated an understanding of the instructions.   They were advised to call back or seek an in-person evaluation if the symptoms worsen or if the condition fails to improve as anticipated.  Lynnea Ferrier, LCSW

## 2022-10-10 ENCOUNTER — Encounter (HOSPITAL_COMMUNITY): Payer: Self-pay | Admitting: *Deleted

## 2022-10-10 ENCOUNTER — Telehealth (HOSPITAL_COMMUNITY): Payer: Self-pay | Admitting: *Deleted

## 2022-10-10 ENCOUNTER — Encounter: Payer: Self-pay | Admitting: Advanced Practice Midwife

## 2022-10-10 ENCOUNTER — Other Ambulatory Visit: Payer: Self-pay | Admitting: Advanced Practice Midwife

## 2022-10-10 ENCOUNTER — Ambulatory Visit (INDEPENDENT_AMBULATORY_CARE_PROVIDER_SITE_OTHER): Payer: Medicaid Other | Admitting: Advanced Practice Midwife

## 2022-10-10 VITALS — BP 110/72 | HR 72 | Wt 160.2 lb

## 2022-10-10 DIAGNOSIS — Z3480 Encounter for supervision of other normal pregnancy, unspecified trimester: Secondary | ICD-10-CM

## 2022-10-10 DIAGNOSIS — Z3A38 38 weeks gestation of pregnancy: Secondary | ICD-10-CM

## 2022-10-10 DIAGNOSIS — M5431 Sciatica, right side: Secondary | ICD-10-CM

## 2022-10-10 DIAGNOSIS — O99891 Other specified diseases and conditions complicating pregnancy: Secondary | ICD-10-CM

## 2022-10-10 DIAGNOSIS — Z3483 Encounter for supervision of other normal pregnancy, third trimester: Secondary | ICD-10-CM

## 2022-10-10 DIAGNOSIS — M7918 Myalgia, other site: Secondary | ICD-10-CM

## 2022-10-10 NOTE — Progress Notes (Signed)
Patient presents for [place] visit. Pt is having increased pain in her left hip that is concerning. Also states there's a "pause" in her urine stream that concerns her.

## 2022-10-10 NOTE — Telephone Encounter (Signed)
Preadmission screen  

## 2022-10-10 NOTE — Progress Notes (Signed)
   PRENATAL VISIT NOTE  Subjective:  Nicole Harris is a 26 y.o. [phone] at [redacted]w[redacted]d being seen today for ongoing prenatal care.  She is currently monitored for the following issues for this low-risk pregnancy and has Nausea and vomiting in pregnancy; Hx of delivery by vacuum extraction, currently pregnant; History of depression; Supervision of other normal pregnancy, antepartum; Regularly irregular pulse rhythym; Low grade squamous intraepithelial lesion (LGSIL) on cervical Pap smear; and Sciatica of right side on their problem list.  Patient reports  pelvic pain, back pain .  Contractions: Irregular. Vag. Bleeding: None.  Movement: Present. Denies leaking of fluid.   The following portions of the patient's history were reviewed and updated as appropriate: allergies, current medications, past family history, past medical history, past social history, past surgical history and problem list.   Objective:   Vitals:   10/10/22 0934  BP: 110/72  Pulse: 72  Weight: 160 lb 3.2 oz (72.7 kg)    Fetal Status: Fetal Heart Rate (bpm): 129 Fundal Height: 39 cm Movement: Present  Presentation: Vertex  General:  Alert, oriented and cooperative. Patient is in no acute distress.  Skin: Skin is warm and dry. No rash noted.   Cardiovascular: Normal heart rate noted  Respiratory: Normal respiratory effort, no problems with respiration noted  Abdomen: Soft, gravid, appropriate for gestational age.  Pain/Pressure: Present     Pelvic: Cervical exam performed in the presence of a chaperone Dilation: 1 Effacement (%): 50 Station: -3  Extremities: Normal range of motion.  Edema: None  Mental Status: Normal mood and affect. Normal behavior. Normal judgment and thought content.   Assessment and Plan:  Pregnancy: [id number] at [redacted]w[redacted]d 1. Supervision of other normal pregnancy, antepartum --Anticipatory guidance about next visits/weeks of pregnancy given.  --Pt with significant pelvic pain, difficult to do cervical  exam in office today because patient unable to lie down or move legs for exam due to pain. --Pt desires elective IOL. Discussed likely increased length of labor, possible increase in risk of cesarean with patient.  Pt may also have time or day postponed for other medical inductions or active labor.  Pt states understanding and desires induction. --IOL scheduled at 39 weeks (range of dates given to schedule). Orders placed.   2. [redacted] weeks gestation of pregnancy   3. Pain in symphysis pubis during pregnancy   4. Right sciatic nerve pain    Term labor symptoms and general obstetric precautions including but not limited to vaginal bleeding, contractions, leaking of fluid and fetal movement were reviewed in detail with the patient. Please refer to After Visit Summary for other counseling recommendations.   Return for Induction on 10/13 or 10/14.  Future Appointments  Date Time Provider [place]  10/12/2022  6:45 AM [facility] SCHED ROOM [facility] None  11/05/2022  3:00 PM Tobb, Godfrey Pick, DO [facility] None    Fatima Blank, CNM

## 2022-10-10 NOTE — Patient Instructions (Signed)
Labor Precautions Reasons to come to [facility]U at [facility]:  1.  Contractions are  5 minutes apart or less, each last 1 minute, these have been going on for 1-2 hours, and you cannot walk or talk during them 2.  You have a large gush of fluid, or a trickle of fluid that will not stop and you have to wear a pad 3.  You have bleeding that is bright red, heavier than spotting--like menstrual bleeding (spotting can be normal in early labor or after a check of your cervix) 4.  You do not feel the baby moving like he/she normally does  

## 2022-10-12 ENCOUNTER — Inpatient Hospital Stay (HOSPITAL_COMMUNITY): Payer: Medicaid Other

## 2022-10-13 ENCOUNTER — Inpatient Hospital Stay (HOSPITAL_COMMUNITY): Payer: Medicaid Other | Admitting: Anesthesiology

## 2022-10-13 ENCOUNTER — Other Ambulatory Visit: Payer: Self-pay

## 2022-10-13 ENCOUNTER — Encounter (HOSPITAL_COMMUNITY): Payer: Self-pay | Admitting: Family Medicine

## 2022-10-13 ENCOUNTER — Inpatient Hospital Stay (HOSPITAL_COMMUNITY)
Admission: RE | Admit: 2022-10-13 | Discharge: 2022-10-15 | DRG: 807 | Disposition: A | Discharge status: Home / Self Care | Payer: Medicaid Other | Attending: Obstetrics & Gynecology | Admitting: Obstetrics & Gynecology

## 2022-10-13 DIAGNOSIS — O3493 Maternal care for abnormality of pelvic organ, unspecified, third trimester: Secondary | ICD-10-CM

## 2022-10-13 DIAGNOSIS — M5431 Sciatica, right side: Secondary | ICD-10-CM | POA: Diagnosis present

## 2022-10-13 DIAGNOSIS — Z8659 Personal history of other mental and behavioral disorders: Secondary | ICD-10-CM

## 2022-10-13 DIAGNOSIS — O9902 Anemia complicating childbirth: Secondary | ICD-10-CM | POA: Diagnosis present

## 2022-10-13 DIAGNOSIS — Z87891 Personal history of nicotine dependence: Secondary | ICD-10-CM

## 2022-10-13 DIAGNOSIS — O26893 Other specified pregnancy related conditions, third trimester: Secondary | ICD-10-CM | POA: Diagnosis present

## 2022-10-13 DIAGNOSIS — Z3480 Encounter for supervision of other normal pregnancy, unspecified trimester: Secondary | ICD-10-CM

## 2022-10-13 DIAGNOSIS — Z3A39 39 weeks gestation of pregnancy: Secondary | ICD-10-CM

## 2022-10-13 DIAGNOSIS — O9081 Anemia of the puerperium: Secondary | ICD-10-CM | POA: Insufficient documentation

## 2022-10-13 LAB — CBC
HCT: 29.2 % — ABNORMAL LOW (ref 36.0–46.0)
Hemoglobin: 8.7 g/dL — ABNORMAL LOW (ref 12.0–15.0)
MCH: 22.3 pg — ABNORMAL LOW (ref 26.0–34.0)
MCHC: 29.8 g/dL — ABNORMAL LOW (ref 30.0–36.0)
MCV: 74.9 fL — ABNORMAL LOW (ref 80.0–100.0)
Platelets: 254 10*3/uL (ref 150–400)
RBC: 3.9 MIL/uL (ref 3.87–5.11)
RDW: 16 % — ABNORMAL HIGH (ref 11.5–15.5)
WBC: 6.4 10*3/uL (ref 4.0–10.5)
nRBC: 0 % (ref 0.0–0.2)

## 2022-10-13 MED ORDER — OXYTOCIN-SODIUM CHLORIDE 30-0.9 UT/500ML-% IV SOLN
1.0000 m[IU]/min | INTRAVENOUS | Status: DC
  Administered 2022-10-13: 1 m[IU]/min via INTRAVENOUS

## 2022-10-13 MED ORDER — PHENYLEPHRINE 80 MCG/ML (10ML) SYRINGE FOR IV PUSH (FOR BLOOD PRESSURE SUPPORT)
80.0000 ug | PREFILLED_SYRINGE | INTRAVENOUS | Status: DC | PRN
  Administered 2022-10-13: 80 ug via INTRAVENOUS

## 2022-10-13 MED ORDER — LIDOCAINE HCL (PF) 1 % IJ SOLN: 30.0000 mL | INTRAMUSCULAR | Status: DC | PRN

## 2022-10-13 MED ORDER — LACTATED RINGERS IV SOLN: 500.0000 mL | INTRAVENOUS | Status: DC | PRN

## 2022-10-13 MED ORDER — TERBUTALINE SULFATE 1 MG/ML IJ SOLN
0.2500 mg | Freq: Once | INTRAMUSCULAR | Status: DC | PRN
  Filled 2022-10-13: qty 1

## 2022-10-13 MED ORDER — ACETAMINOPHEN 325 MG PO TABS: 650.0000 mg | ORAL_TABLET | ORAL | Status: DC | PRN

## 2022-10-13 MED ORDER — PHENYLEPHRINE 80 MCG/ML (10ML) SYRINGE FOR IV PUSH (FOR BLOOD PRESSURE SUPPORT)
80.0000 ug | PREFILLED_SYRINGE | INTRAVENOUS | Status: DC | PRN
  Filled 2022-10-13: qty 10

## 2022-10-13 MED ORDER — TERBUTALINE SULFATE 1 MG/ML IJ SOLN
0.2500 mg | Freq: Once | INTRAMUSCULAR | Status: AC | PRN
  Administered 2022-10-13: 0.25 mg via SUBCUTANEOUS

## 2022-10-13 MED ORDER — OXYCODONE-ACETAMINOPHEN 5-325 MG PO TABS: 2.0000 | ORAL_TABLET | ORAL | Status: DC | PRN

## 2022-10-13 MED ORDER — MISOPROSTOL 50MCG HALF TABLET
50.0000 ug | ORAL_TABLET | ORAL | Status: DC | PRN
  Administered 2022-10-13: 50 ug via BUCCAL

## 2022-10-13 MED ORDER — OXYTOCIN BOLUS FROM INFUSION
333.0000 mL | Freq: Once | INTRAVENOUS | Status: AC
  Administered 2022-10-13: 333 mL via INTRAVENOUS

## 2022-10-13 MED ORDER — LACTATED RINGERS IV SOLN: INTRAVENOUS | Status: DC

## 2022-10-13 MED ORDER — MISOPROSTOL 50MCG HALF TABLET: 50.0000 ug | ORAL_TABLET | ORAL | Status: DC | PRN

## 2022-10-13 MED ORDER — LACTATED RINGERS IV SOLN: 500.0000 mL | Freq: Once | INTRAVENOUS | Status: AC

## 2022-10-13 MED ORDER — TERBUTALINE SULFATE 1 MG/ML IJ SOLN: 0.2500 mg | Freq: Once | INTRAMUSCULAR | Status: DC | PRN

## 2022-10-13 MED ORDER — SOD CITRATE-CITRIC ACID 500-334 MG/5ML PO SOLN: 30.0000 mL | ORAL | Status: DC | PRN

## 2022-10-13 MED ORDER — MISOPROSTOL 50MCG HALF TABLET: 50.0000 ug | ORAL_TABLET | Freq: Once | ORAL | Status: DC

## 2022-10-13 MED ORDER — DIPHENHYDRAMINE HCL 50 MG/ML IJ SOLN: 12.5000 mg | INTRAMUSCULAR | Status: DC | PRN

## 2022-10-13 MED ORDER — ONDANSETRON HCL 4 MG/2ML IJ SOLN: 4.0000 mg | Freq: Four times a day (QID) | INTRAMUSCULAR | Status: DC | PRN

## 2022-10-13 MED ORDER — OXYTOCIN-SODIUM CHLORIDE 30-0.9 UT/500ML-% IV SOLN
2.5000 [IU]/h | INTRAVENOUS | Status: DC
  Filled 2022-10-13: qty 500

## 2022-10-13 MED ORDER — EPHEDRINE 5 MG/ML INJ: 10.0000 mg | INTRAVENOUS | Status: DC | PRN

## 2022-10-13 MED ORDER — FENTANYL-BUPIVACAINE-NACL 0.5-0.125-0.9 MG/250ML-% EP SOLN
12.0000 mL/h | EPIDURAL | Status: DC | PRN
  Administered 2022-10-13: 12 mL/h via EPIDURAL
  Filled 2022-10-13: qty 250

## 2022-10-13 MED ORDER — OXYCODONE-ACETAMINOPHEN 5-325 MG PO TABS: 1.0000 | ORAL_TABLET | ORAL | Status: DC | PRN

## 2022-10-13 MED ORDER — MISOPROSTOL 50MCG HALF TABLET
50.0000 ug | ORAL_TABLET | Freq: Once | ORAL | Status: DC
  Filled 2022-10-13: qty 1

## 2022-10-13 MED ORDER — LIDOCAINE HCL (PF) 1 % IJ SOLN
INTRAMUSCULAR | Status: DC | PRN
  Administered 2022-10-13 (×2): 4 mL via EPIDURAL

## 2022-10-13 NOTE — Anesthesia Procedure Notes (Signed)
Epidural Patient location during procedure: OB Start time: 10/13/2022 6:22 PM End time: 10/13/2022 6:25 PM  Staffing Anesthesiologist: Brennan Bailey, MD Performed: anesthesiologist   Preanesthetic Checklist Completed: patient identified, IV checked, risks and benefits discussed, monitors and equipment checked, pre-op evaluation and timeout performed  Epidural Patient position: sitting Prep: DuraPrep and site prepped and draped Patient monitoring: continuous pulse ox, blood pressure and heart rate Approach: midline Location: L3-L4 Injection technique: LOR air  Needle:  Needle type: Tuohy  Needle gauge: 17 G Needle length: 9 cm Needle insertion depth: 5 cm Catheter type: closed end flexible Catheter size: 19 Gauge Catheter at skin depth: 10 cm Test dose: negative and Other (1% lidocaine)  Assessment Events: blood not aspirated, injection not painful, no injection resistance, no paresthesia and negative IV test  Additional Notes Patient identified. Risks, benefits, and alternatives discussed with patient including but not limited to bleeding, infection, nerve damage, paralysis, failed block, incomplete pain control, headache, blood pressure changes, nausea, vomiting, reactions to medication, itching, and postpartum back pain. Confirmed with bedside nurse the patient's most recent platelet count. Confirmed with patient that they are not currently taking any anticoagulation, have any bleeding history, or any family history of bleeding disorders. Patient expressed understanding and wished to proceed. All questions were answered. Sterile technique was used throughout the entire procedure. Please see nursing notes for vital signs.   Crisp LOR on first pass. Test dose was given through epidural catheter and negative prior to continuing to dose epidural or start infusion. Warning signs of high block given to the patient including shortness of breath, tingling/numbness in hands, complete  motor block, or any concerning symptoms with instructions to call for help. Patient was given instructions on fall risk and not to get out of bed. All questions and concerns addressed with instructions to call with any issues or inadequate analgesia.  Reason for block:procedure for pain

## 2022-10-13 NOTE — Discharge Summary (Signed)
Postpartum Discharge Summary  Date of Service updated***     Patient Name: Nicole Harris DOB: Feb 15, 1996 MRN: [id number]  Date of admission: 10/13/2022 Delivery date:10/13/2022  Delivering provider: Shelda Pal  Date of discharge: 10/13/2022  Admitting diagnosis: [redacted] weeks gestation of pregnancy [Z3A.39] Intrauterine pregnancy: [redacted]w[redacted]d     Secondary diagnosis:  Principal Problem:   Vaginal delivery Active Problems:   History of depression   Supervision of other normal pregnancy, antepartum   [redacted] weeks gestation of pregnancy  Additional problems: ***    Discharge diagnosis: Term Pregnancy Delivered                                              Post partum procedures:{Postpartum procedures:23558} Augmentation: Pitocin and Cytotec Complications: None  Hospital course: Induction of Labor With Vaginal Delivery   26 y.o. yo [id number] at [redacted]w[redacted]d was admitted to the hospital 10/13/2022 for induction of labor.  Indication for induction: Elective.  Patient had an labor course complicated bynone Membrane Rupture Time/Date: 11:06 PM ,10/13/2022   Delivery Method:Vaginal, Spontaneous  Episiotomy: None  Lacerations:  None  Details of delivery can be found in separate delivery note.  Patient had a postpartum course complicated by***. Patient is discharged home 10/13/22.  Newborn Data: Birth date:10/13/2022  Birth time:11:06 PM  Gender:Female  Living status:Living  Apgars:9 ,9  Weight:   Magnesium Sulfate received: {Mag received:30440022} BMZ received: No Rhophylac:N/A MMR:N/A T-DaP:{Tdap:23962} Flu: {SMO:70786} Transfusion:{Transfusion received:30440034}  Physical exam  Vitals:   10/13/22 1905 10/13/22 1910 10/13/22 2230 10/13/22 2234  BP: 120/76  (!) 91/41 113/61  Pulse: 89  71 77  Resp: 18     Temp: 98.1 F (36.7 C)   98.2 F (36.8 C)  TempSrc: Oral   Oral  SpO2: 100% 100% 97%   Weight:      Height:       General: {Exam; general:21111117} Lochia: {Desc;  appropriate/inappropriate:30686::"appropriate"} Uterine Fundus: {Desc; firm/soft:30687} Incision: {Exam; incision:21111123} DVT Evaluation: {Exam; dvt:2111122} Labs: Lab Results  Component Value Date   WBC 6.4 10/13/2022   HGB 8.7 (L) 10/13/2022   HCT 29.2 (L) 10/13/2022   MCV 74.9 (L) 10/13/2022   PLT 254 10/13/2022      Latest Ref Rng & Units 02/14/2017    1:20 PM  CMP  Glucose 65 - 99 mg/dL 84   BUN 6 - 20 mg/dL 8   Creatinine 0.44 - 1.00 mg/dL 0.67   Sodium 135 - 145 mmol/L 140   Potassium 3.5 - 5.1 mmol/L 4.2   Chloride 101 - 111 mmol/L 106   CO2 22 - 32 mmol/L 25   Calcium 8.9 - 10.3 mg/dL 9.9    Edinburgh Score:    02/06/2021   10:50 AM  Edinburgh Postnatal Depression Scale Screening Tool  I have been able to laugh and see the funny side of things. 1  I have looked forward with enjoyment to things. 1  I have blamed myself unnecessarily when things went wrong. 2  I have been anxious or worried for no good reason. 3  I have felt scared or panicky for no good reason. 0  Things have been getting on top of me. 1  I have been so unhappy that I have had difficulty sleeping. 0  I have felt sad or miserable. 2  I have been so unhappy that I have been crying. 2  The thought of harming myself has occurred to me. 1  Edinburgh Postnatal Depression Scale Total 13     After visit meds:  Allergies as of 10/13/2022       Reactions   Latex Rash   Shellfish Allergy Rash     Med Rec must be completed prior to using this [facility]***        Discharge home in stable condition Infant Feeding: {Baby feeding:23562} Infant Disposition:{CHL IP OB HOME WITH OGACGB:84730} Discharge instruction: per After Visit Summary and Postpartum booklet. Activity: Advance as tolerated. Pelvic rest for 6 weeks.  Diet: {OB YLUD:43700525} Future Appointments: Future Appointments  Date Time Provider [place]  11/05/2022  3:00 PM Tobb, Godfrey Pick, DO [facility] None   Follow up  Visit: Message sent to [facility] on 10/14  Please schedule this patient for a In person postpartum visit in 6 weeks with the following provider: Any provider. Additional Postpartum F/U:Postpartum Depression checkup  Low risk pregnancy complicated by:  n/a Delivery mode:  Vaginal, Spontaneous  Anticipated Birth Control:   declined   10/13/2022 Shelda Pal, DO

## 2022-10-13 NOTE — Progress Notes (Cosign Needed Addendum)
Labor Progress Note MELA PERHAM is a 26 y.o. [id number] at [redacted]w[redacted]d presented for eIOL  S: Pt feeling R hip pain. Epidural placed  O:  BP 120/76   Pulse 89   Temp 98.1 F (36.7 C) (Oral)   Resp 18   Ht 5\' 4"  (1.626 m)   Wt 73 kg   LMP 01/12/2022   SpO2 100%   BMI 27.62 kg/m  EFM: 130 bpm/Moderate variability/ 15x15 accels/ None decels  CVE: Dilation: 2.5 Effacement (%): 50 Cervical Position: Posterior Station: -2 Presentation: Vertex Exam by:: Nicanor Bake, RN   A&P: 26 y.o. [phone] [redacted]w[redacted]d  Iol as above #Labor: Progressing well. Will attempt FB shortly #Pain: Epidural #FWB: CAT 1 #GBS negative  Ilean China, SNM

## 2022-10-13 NOTE — Progress Notes (Signed)
Nicole Harris is a 26 y.o. S2G3151 at [redacted]w[redacted]d by LMP admitted for induction of labor due to Elective at term.  Subjective: Patient tearful, previous traumatic delivery with a nuchal cord. Introductions quickly exchanged.   Objective: BP 124/73   Pulse 86   Temp 98.1 F (36.7 C) (Oral)   Resp 15   Ht 5\' 4"  (1.626 m)   Wt 73 kg   LMP 01/12/2022   BMI 27.62 kg/m  No intake/output data recorded. No intake/output data recorded.  FHT:  FHR: 160 bpm, variability: moderate,  accelerations:  Abscent,  decelerations:  Present prolonged x1 and variables with contractions  UC:   palpable strong. Every 2 mins  SVE:   Dilation: 1 Effacement (%): 50 Station: -2 Exam by:: Nicole Bake, RN  Labs: Lab Results  Component Value Date   WBC 6.4 10/13/2022   HGB 8.7 (L) 10/13/2022   HCT 29.2 (L) 10/13/2022   MCV 74.9 (L) 10/13/2022   PLT 254 10/13/2022    CNM called to bedside audible fetal heart tones in the the 70's. Fundal tone firm, with minimal relaxation. Per RN FHTs down for >83mins. Terb ordered and Dr. Harolyn Harris notified.   - After terb, FHT began climbing up to 120's.  - @ 12:32 Dr. Harolyn Harris at bedside FHT 160's and sustaining. Fundal tone relaxing.    Assessment / Plan: Induction of labor due to term with favorable cervix,  progressing well on pitocin  Labor:  s/p dual cytotec. Allow fetus to recover and possibly discuss a foley balloon with low dose pit at next interval.  Preeclampsia:   n/a  Fetal Wellbeing:  Category II Pain Control:  Labor support without medications at this time  I/D:  n/a Anticipated MOD:  NSVD  Nicole Harris, CNM 10/13/2022, 12:36 PM

## 2022-10-13 NOTE — H&P (Signed)
OBSTETRIC ADMISSION HISTORY AND PHYSICAL  Nicole Harris is a 26 y.o. female [phone] with IUP at [redacted]w[redacted]d by LMP presenting for eIOL (due to pelvic/hip pain). She reports +FMs, No LOF, no VB, no blurry vision, headaches or peripheral edema, and RUQ pain.  She plans on breast feeding. She request nothing for birth control (her husband is getting a vasectomy and she will use abstinence/condoms until he is cleared). 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  Past Medical History: Past Medical History:  Diagnosis Date   ADHD (attention deficit hyperactivity disorder)    Anemia    Anxiety    Bipolar disorder ([place])    Depression    Headache    Nausea/vomiting in pregnancy 07/10/2018   Stress incontinence in female 02/16/2021   Past Surgical History: Past Surgical History:  Procedure Laterality Date   ADENOIDECTOMY     TONGUE SURGERY     TONSILLECTOMY     Obstetrical History: OB History     Gravida  4   Para  2   Term  2   Preterm      AB  1   Living  2      SAB      IAB  1   Ectopic      Multiple  0   Live Births  2          Social History Social History   Socioeconomic History   Marital status: Significant Other    Spouse name: Not on file   Number of children: 1   Years of education: Not on file   Highest education level: Not on file  Occupational History   Occupation: unemployed  Tobacco Use   Smoking status: Former    Types: Cigarettes    Passive exposure: Past   Smokeless tobacco: Never  Vaping Use   Vaping Use: Never used  Substance and Sexual Activity   Alcohol use: Not Currently    Comment: occasionally   Drug use: Not Currently    Types: Marijuana   Sexual activity: Yes    Birth control/protection: None   Other Topics Concern   Not on file  Social History Narrative   Not on file   Social Determinants of Health   Financial Resource Strain: Low Risk  (01/30/2019)   Overall Financial Resource Strain (CARDIA)    Difficulty of Paying Living Expenses: Not hard at all  Food Insecurity: No Food Insecurity (10/13/2022)   Hunger Vital Sign    Worried About Running Out of Food in the Last Year: Never true    Ran Out of Food in the Last Year: Never true  Transportation Needs: No Transportation Needs (10/13/2022)   PRAPARE - Hydrologist (Medical): No    Lack of Transportation (Non-Medical): No  Physical Activity: Not on file  Stress: No Stress Concern Present (01/30/2019)   [place]    Feeling of Stress : Only a little  Social Connections: Not on file   Family History: Family History  Problem Relation Age of Onset   Drug abuse Mother    Healthy Father    Cancer Maternal Grandmother    Thyroid disease Maternal Grandmother    Ovarian cancer Maternal Grandmother  Diabetes Paternal Grandmother    Hypertension Paternal Grandfather    Allergies: Allergies  Allergen Reactions   Latex Rash   Shellfish Allergy Rash   Medications Prior to Admission  Medication Sig Dispense Refill Last Dose   cyclobenzaprine (FLEXERIL) 5 MG tablet Take 1 tablet (5 mg total) by mouth 3 (three) times daily as needed for muscle spasms. 20 tablet 0 Past Month   Doxylamine-Pyridoxine (DICLEGIS) 10-10 MG TBEC TAKE 2 TABLETS BY MOUTH AT BEDTIME. IF NEEDED, ADD ANOTHER TABLET IN THE MORNING. IF NEEDED ADD ANOTHER TABLET IN THE AFTERNOON. MAX 4 TABS/DAY 100 tablet 3 Past Month   Blood Pressure Monitoring (BLOOD PRESSURE KIT) DEVI 1 kit by Does not apply route as needed. 1 each 0    Review of Systems  All systems reviewed and negative except as stated in HPI  Blood pressure 127/74, pulse 84, temperature 98 F (36.7 C),  temperature source Oral, resp. rate 17, height $RemoveBe'5\' 4"'NaMucXiEc$  (1.626 m), weight 160 lb 14.4 oz (73 kg), last menstrual period 01/12/2022, SpO2 100 %, unknown if currently breastfeeding. General appearance: alert, cooperative, appears stated age, and no distress Lungs: clear to auscultation bilaterally Heart: regular rate and rhythm Abdomen: soft, non-tender; bowel sounds normal Pelvic: normal external female genitalia Extremities: Homans sign is negative, no sign of DVT DTR's normal Presentation: cephalic Fetal monitoring: Baseline: 130 bpm, Variability: Good {> 6 bpm), Accelerations: Reactive, and Decelerations: Absent Uterine activity: started earlier this morning, q3-80min Dilation: 1 Effacement (%): 50 Station: -2 Exam by:: Merton Border, student midwife  Prenatal labs: ABO, Rh: --/--/O POS (10/14 1044) Antibody: POS (10/14 1044) Rubella: 1.64 (04/28 1145) RPR: Non Reactive (07/24 1102)  HBsAg: Negative (04/28 1145)  HIV: Non Reactive (07/24 1102)  GBS: Negative/-- (09/21 1053)  3hr GTT normal Genetic screening  normal Anatomy US normal  Prenatal Transfer Tool  Maternal Diabetes: No Genetic Screening: Normal Maternal Ultrasounds/Referrals: Normal Fetal Ultrasounds or other Referrals:  Referred to Materal Fetal Medicine  Maternal Substance Abuse:  No Significant Maternal Medications:  None Significant Maternal Lab Results:  None Number of Prenatal Visits:Less than or equal to 3 verified prenatal visits Other Comments:  None  Results for orders placed or performed during the hospital encounter of 10/13/22 (from the past 24 hour(s))  CBC   Collection Time: 10/13/22 10:44 AM  Result Value Ref Range   WBC 6.4 4.0 - 10.5 K/uL   RBC 3.90 3.87 - 5.11 MIL/uL   Hemoglobin 8.7 (L) 12.0 - 15.0 g/dL   HCT 29.2 (L) 36.0 - 46.0 %   MCV 74.9 (L) 80.0 - 100.0 fL   MCH 22.3 (L) 26.0 - 34.0 pg   MCHC 29.8 (L) 30.0 - 36.0 g/dL   RDW 16.0 (H) 11.5 - 15.5 %   Platelets 254 150 - 400 K/uL    nRBC 0.0 0.0 - 0.2 %  Type and screen   Collection Time: 10/13/22 10:44 AM  Result Value Ref Range   ABO/RH(D) O POS    Antibody Screen POS    Sample Expiration 10/16/2022,2359    Antibody Identification NO CLINICALLY SIGNIFICANT ANTIBODY IDENTIFIED    Unit Number [id number]    Blood Component Type RBC LR PHER1    Unit division 00    Status of Unit ALLOCATED    Transfusion Status PENDING    Crossmatch Result COMPATIBLE    Unit Number [id number]    Blood Component Type RED CELLS,LR    Unit division 00    Status of Unit ALLOCATED  Transfusion Status PENDING    Crossmatch Result COMPATIBLE   BPAM RBC   Collection Time: 10/13/22 10:44 AM  Result Value Ref Range   Blood Product Unit Number [id number]    PRODUCT CODE E0352Y81    Unit Type and Rh 5100    Blood Product Expiration Date 859093112162    Blood Product Unit Number [id number]    PRODUCT CODE N1833P82    Unit Type and Rh 5100    Blood Product Expiration Date 518984210312     Patient Active Problem List   Diagnosis Date Noted   [redacted] weeks gestation of pregnancy 10/13/2022   Sciatica of right side 07/23/2022   Low grade squamous intraepithelial lesion (LGSIL) on cervical Pap smear 05/09/2022   Regularly irregular pulse rhythym 04/28/2022   Supervision of other normal pregnancy, antepartum 04/16/2022   History of depression 10/19/2020   Hx of delivery by vacuum extraction, currently pregnant 09/14/2020   Nausea and vomiting in pregnancy 07/10/2018    Assessment/Plan:  CODY OLIGER is a 26 y.o. O1V8867 at [redacted]w[redacted]d here for eIOL at term for ongoing pelvic, back and sciatic pain.  #Labor: Latent, will begin with 50 oral cytotec since pt is already contracting and will encourage ambulation #Pain: Planning epidural #FWB: Cat 1 #ID:  GBS neg #MOF: Breast  #MOC: Vasectomy/condoms #Circ:  Yes  Hemoglobin 8.7, will run TXA during 2nd stage and recommend postpartum venofer infusion.  Gaylan Gerold, CNM,  MSN, [place] Certified Nurse Midwife, [facility]

## 2022-10-13 NOTE — Anesthesia Preprocedure Evaluation (Signed)
Anesthesia Evaluation  Patient identified by MRN, date of birth, ID band Patient awake    Reviewed: Allergy & Precautions, Patient's Chart, lab work & pertinent test results  History of Anesthesia Complications Negative for: history of anesthetic complications  Airway Mallampati: II  TM Distance: >3 FB Neck ROM: Full    Dental no notable dental hx.    Pulmonary former smoker,    Pulmonary exam normal        Cardiovascular negative cardio ROS Normal cardiovascular exam     Neuro/Psych  Headaches, Anxiety Depression Bipolar Disorder    GI/Hepatic negative GI ROS, Neg liver ROS,   Endo/Other  negative endocrine ROS  Renal/GU negative Renal ROS  negative genitourinary   Musculoskeletal negative musculoskeletal ROS (+)   Abdominal   Peds  Hematology  (+) Blood dyscrasia (Hgb 8.7), anemia ,   Anesthesia Other Findings Day of surgery medications reviewed with patient.  Reproductive/Obstetrics (+) Pregnancy                             Anesthesia Physical Anesthesia Plan  ASA: 2  Anesthesia Plan: Epidural   Post-op Pain Management:    Induction:   PONV Risk Score and Plan: Treatment may vary due to age or medical condition  Airway Management Planned: Natural Airway  Additional Equipment: Fetal Monitoring  Intra-op Plan:   Post-operative Plan:   Informed Consent: I have reviewed the patients History and Physical, chart, labs and discussed the procedure including the risks, benefits and alternatives for the proposed anesthesia with the patient or authorized representative who has indicated his/her understanding and acceptance.       Plan Discussed with:   Anesthesia Plan Comments:         Anesthesia Quick Evaluation

## 2022-10-13 NOTE — Progress Notes (Signed)
Nicole Harris is a 26 y.o. S5K5397 at [redacted]w[redacted]d by  admitted for elective induction for pelvic/hip pain.  Subjective:  Sitting with family at bedside, eating. Feeling some contractions.     Objective: BP 127/74   Pulse 84   Temp 98 F (36.7 C) (Oral)   Resp 17   Ht 5\' 4"  (1.626 m)   Wt 73 kg   LMP 01/12/2022   SpO2 100%   BMI 27.62 kg/m  No intake/output data recorded. Total I/O In: 1007.1 [P.O.:240; I.V.:767.1] Out: -   FHT:  FHR: 145 bpm, variability: moderate,  accelerations:  Present,  decelerations:  Absent UC:   irregular, every 2-4 minutes SVE:   Dilation: 1 Effacement (%): 50 Station: -2 Exam by:: Ilean China, student midwife  Labs: Lab Results  Component Value Date   WBC 6.4 10/13/2022   HGB 8.7 (L) 10/13/2022   HCT 29.2 (L) 10/13/2022   MCV 74.9 (L) 10/13/2022   PLT 254 10/13/2022    Assessment / Plan: Attempted to place foley bulb with speculum, Patient unable to tolerate speculum and supine position due to pelvic and hip pain. Will start pitocin at low dose for cervical ripening.   Labor:  Pitocin , then AROM when appropriate. Fetal Wellbeing:  Category I Pain Control:   Epidural prn I/D:   GBS negative Anticipated MOD:  NSVD  Hollace Hayward, Student-MidWife 10/13/2022, 6:32 PM

## 2022-10-14 ENCOUNTER — Encounter (HOSPITAL_COMMUNITY): Payer: Self-pay | Admitting: Family Medicine

## 2022-10-14 DIAGNOSIS — O9081 Anemia of the puerperium: Secondary | ICD-10-CM | POA: Insufficient documentation

## 2022-10-14 LAB — CBC
HCT: 26 % — ABNORMAL LOW (ref 36.0–46.0)
Hemoglobin: 8 g/dL — ABNORMAL LOW (ref 12.0–15.0)
MCH: 23.1 pg — ABNORMAL LOW (ref 26.0–34.0)
MCHC: 30.8 g/dL (ref 30.0–36.0)
MCV: 74.9 fL — ABNORMAL LOW (ref 80.0–100.0)
Platelets: 228 10*3/uL (ref 150–400)
RBC: 3.47 MIL/uL — ABNORMAL LOW (ref 3.87–5.11)
RDW: 15.9 % — ABNORMAL HIGH (ref 11.5–15.5)
WBC: 10.3 10*3/uL (ref 4.0–10.5)
nRBC: 0 % (ref 0.0–0.2)

## 2022-10-14 LAB — ABO/RH: ABO/RH(D): O POS

## 2022-10-14 LAB — RPR: RPR Ser Ql: NONREACTIVE

## 2022-10-14 MED ORDER — TETANUS-DIPHTH-ACELL PERTUSSIS 5-2.5-18.5 LF-MCG/0.5 IM SUSY: 0.5000 mL | PREFILLED_SYRINGE | Freq: Once | INTRAMUSCULAR | Status: DC

## 2022-10-14 MED ORDER — ONDANSETRON HCL 4 MG/2ML IJ SOLN: 4.0000 mg | INTRAMUSCULAR | Status: DC | PRN

## 2022-10-14 MED ORDER — SIMETHICONE 80 MG PO CHEW: 80.0000 mg | CHEWABLE_TABLET | ORAL | Status: DC | PRN

## 2022-10-14 MED ORDER — DIBUCAINE (PERIANAL) 1 % EX OINT: 1.0000 | TOPICAL_OINTMENT | CUTANEOUS | Status: DC | PRN

## 2022-10-14 MED ORDER — ZOLPIDEM TARTRATE 5 MG PO TABS: 5.0000 mg | ORAL_TABLET | Freq: Every evening | ORAL | Status: DC | PRN

## 2022-10-14 MED ORDER — SODIUM CHLORIDE 0.9 % IV SOLN
500.0000 mg | Freq: Once | INTRAVENOUS | Status: DC
  Filled 2022-10-14: qty 25

## 2022-10-14 MED ORDER — WITCH HAZEL-GLYCERIN EX PADS: 1.0000 | MEDICATED_PAD | CUTANEOUS | Status: DC | PRN

## 2022-10-14 MED ORDER — BENZOCAINE-MENTHOL 20-0.5 % EX AERO: 1.0000 | INHALATION_SPRAY | CUTANEOUS | Status: DC | PRN

## 2022-10-14 MED ORDER — ONDANSETRON HCL 4 MG PO TABS: 4.0000 mg | ORAL_TABLET | ORAL | Status: DC | PRN

## 2022-10-14 MED ORDER — LORATADINE 10 MG PO TABS
10.0000 mg | ORAL_TABLET | Freq: Every day | ORAL | Status: DC
  Administered 2022-10-14: 10 mg via ORAL
  Filled 2022-10-14 (×2): qty 1

## 2022-10-14 MED ORDER — SENNOSIDES-DOCUSATE SODIUM 8.6-50 MG PO TABS
2.0000 | ORAL_TABLET | ORAL | Status: DC
  Administered 2022-10-14: 2 via ORAL
  Filled 2022-10-14 (×2): qty 2

## 2022-10-14 MED ORDER — ACETAMINOPHEN 325 MG PO TABS: 650.0000 mg | ORAL_TABLET | ORAL | Status: DC | PRN

## 2022-10-14 MED ORDER — PRENATAL MULTIVITAMIN CH
1.0000 | ORAL_TABLET | Freq: Every day | ORAL | Status: DC
  Administered 2022-10-14: 1 via ORAL
  Filled 2022-10-14 (×2): qty 1

## 2022-10-14 MED ORDER — SODIUM CHLORIDE 0.9% FLUSH
3.0000 mL | Freq: Two times a day (BID) | INTRAVENOUS | Status: DC
  Administered 2022-10-14: 3 mL via INTRAVENOUS

## 2022-10-14 MED ORDER — COCONUT OIL OIL: 1.0000 | TOPICAL_OIL | Status: DC | PRN

## 2022-10-14 MED ORDER — IBUPROFEN 600 MG PO TABS
600.0000 mg | ORAL_TABLET | Freq: Four times a day (QID) | ORAL | Status: DC
  Administered 2022-10-14 – 2022-10-15 (×4): 600 mg via ORAL
  Filled 2022-10-14 (×6): qty 1

## 2022-10-14 MED ORDER — SODIUM CHLORIDE 0.9 % IV SOLN: 250.0000 mL | INTRAVENOUS | Status: DC | PRN

## 2022-10-14 MED ORDER — DIPHENHYDRAMINE HCL 25 MG PO CAPS: 25.0000 mg | ORAL_CAPSULE | Freq: Four times a day (QID) | ORAL | Status: DC | PRN

## 2022-10-14 MED ORDER — SODIUM CHLORIDE 0.9% FLUSH: 3.0000 mL | INTRAVENOUS | Status: DC | PRN

## 2022-10-14 NOTE — Lactation Note (Signed)
This note was copied from a baby's chart. Lactation Consultation Note  Patient Name: Nicole Harris [id number] Date: 10/14/2022 Age: 24 hours / experienced BF x 2  Reason for consult: Initial assessment;Term (Baby latched as LC entered the room, per dad start time 1209. LC noted swallows. LC adjusted the position without releasing the baby. LC recommended to mom to increase depth allow the baby to hug her br by placing arm under the Br. Depth noted.)  Maternal Data Has patient been taught Hand Expression?:  (per mom comfortable with hand expressing. experienced BF x 2) Does the patient have breastfeeding experience prior to this delivery?: Yes How long did the patient breastfeed?: per mom BF her 1st baby 1 year and her 2nd baby 8 months and pumped for another month  Feeding Mother's Current Feeding Choice: Breast Milk  LATCH Score Latch:  (latched on the Lt Br)  Audible Swallowing:  (swallows noted)  Type of Nipple: Everted at rest and after stimulation  Comfort (Breast/Nipple):  (per mom comfortable)  Hold (Positioning):  (mom latched the baby by herself prior to [facility] consult)   Lactation Tools Discussed/Used    Interventions Interventions: Breast feeding basics reviewed;Adjust position;Education;LC Services brochure  Discharge Pump: DEBP;Personal (per mom has to order the new parts on line)  Consult Status Consult Status: Follow-up Date: 10/15/22 Follow-up type: In-patient    [place] 10/14/2022, 12:30 PM

## 2022-10-14 NOTE — Anesthesia Postprocedure Evaluation (Signed)
Anesthesia Post Note  Patient: Nicole Harris  Procedure(s) Performed: AN AD [place]     Patient location during evaluation: Mother Baby Anesthesia Type: Epidural Level of consciousness: awake and alert Pain management: pain level controlled Vital Signs Assessment: post-procedure vital signs reviewed and stable Respiratory status: spontaneous breathing, nonlabored ventilation and respiratory function stable Cardiovascular status: stable Postop Assessment: no headache, no backache, epidural receding, no apparent nausea or vomiting, patient able to bend at knees, adequate PO intake and able to ambulate Anesthetic complications: no   No notable events documented.  Last Vitals:  Vitals:   10/14/22 0300 10/14/22 0635  BP: 116/76 123/71  Pulse: 78 75  Resp: 18 18  Temp: 36.7 C   SpO2:      Last Pain:  Vitals:   10/14/22 0745  TempSrc:   PainSc: 0-No pain   Pain Goal:                   [employer]

## 2022-10-14 NOTE — Progress Notes (Addendum)
Post Partum Day 1 Subjective: No complaints, up ad lib, voiding, tolerating PO, and + flatus.  Baby is stable at bedside, breastfeeding. Normal expected lochia  Objective: Blood pressure 123/71, pulse 75, temperature 98 F (36.7 C), temperature source Oral, resp. rate 18, height 5\' 4"  (1.626 m), weight 73 kg, last menstrual period 01/12/2022, SpO2 100 %, unknown if currently breastfeeding.  Physical Exam:  General: alert and no distress Lochia: appropriate Uterine Fundus: firm DVT Evaluation: No evidence of DVT seen on physical exam. Negative Homan's sign. No cords or calf tenderness. No significant calf/ankle edema.  Recent Labs    10/13/22 1044 10/14/22 0537  HGB 8.7* 8.0*  HCT 29.2* 26.0*   Assessment/Plan: Breastfeeding, Circumcision prior to discharge, and Contraception Vasectomy Counseled about Venofer given for anemia that was noted prior to delivery, EBL was 84 ml. Patient declined this, wants oral iron therapy which is already prescribed.  Patient desires circumcision for her female infant.  Circumcision procedure details discussed, risks and benefits of procedure were also discussed.  These include but are not limited to: Benefits of circumcision in men include reduction in the rates of urinary tract infection (UTI), penile cancer, some sexually transmitted infections, penile inflammatory and retractile disorders, as well as easier hygiene.  Risks include bleeding , infection, injury of glans which may lead to penile deformity or urinary tract issues, unsatisfactory cosmetic appearance and other potential complications related to the procedure.  It was emphasized that this is an elective procedure.  Patient wants to proceed with circumcision; written informed consent obtained.  Will do circumcision soon, routine circumcision and post circumcision care ordered for the infant. Routine postpartum care.    LOS: 1 day   Verita Schneiders, MD 10/14/2022, 7:45 AM

## 2022-10-14 NOTE — Clinical Social Work Maternal (Addendum)
CLINICAL SOCIAL WORK MATERNAL/CHILD NOTE  Patient Details  Name: Nicole Harris MRN: [id number] Date of Birth: 08/21/1996  Date:  10/14/2022  Clinical Social Worker Initiating Note:  German Manke, LCSWA Date/Time: Initiated:  10/14/22/1704     Child's Name:  Nicole Harris (DOB: 10/13/2020)   Biological Parents:  Father, Mother (FOB: Nicole Harris, DOB: 07/16/1997)   Need for Interpreter:  None   Reason for Referral:  Behavioral Health Concerns, Current Substance Use/Substance Use During Pregnancy     Address:  [address] [place] [address]    Phone number:  [phone] (home)     Additional phone number:   Household Members/Support Persons (HM/SP):   Household Member/Support Person 1, Household Member/Support Person 2, Household Member/Support Person 3   HM/SP Name Relationship DOB or Age  HM/SP -1 Nicole Harris FOB 07/16/1997  HM/SP -2 Nicole Harris Son 02/02/2019  HM/SP -3 Nicole Harris Daughter 02/05/2021  HM/SP -4        HM/SP -5        HM/SP -6        HM/SP -7        HM/SP -8          Natural Supports (not living in the home):  Immediate Family, Parent, Extended Family   Professional Supports: None   Employment: Unemployed   Type of Work:     Education:  High school graduate   Homebound arranged:    Financial Resources:  Medicaid   Other Resources:  WIC, Food Stamps     Cultural/Religious Considerations Which May Impact Care:  None identified  Strengths:  Ability to meet basic needs  , Home prepared for child  , Compliance with medical plan  , Pediatrician chosen   Psychotropic Medications:         Pediatrician:    [place] area  Pediatrician List:   [facility]  [place]    [place]    [place]    [place]    [place]      Pediatrician Fax Number:    Risk Factors/Current Problems:  Mental Health Concerns  , Substance Use     Cognitive State:   Able to Concentrate  , Alert  , Linear Thinking  , Goal Oriented     Mood/Affect:  Interested  , Comfortable  , Relaxed  , Calm     CSW Assessment: CSW received consult due to history of depression and documented marijuana use during pregnancy. CSW met with MOB at bedside to complete assessment. When CSW entered room, FOB was present. CSW introduced self and requested to speak with MOB alone. MOB provided verbal consent to complete consult with FOB present. CSW explained reasons for consult. MOB presented as calm and engaged throughout consult.  CSW inquired how MOB has felt emotionally since giving birth. MOB reports she feels "pretty good." CSW inquired about MOB's mental health history. MOB reports she was diagnosed with Bipolar Disorder, MOB identified as "Bipolar Depression" as a child. MOB denies a history of hypermania/mania, sharing that her mental health symptoms are marked by intense anger and depression. MOB reports she has "gotten good at dealing with it". MOB reports she was diagnosed with Bipolar Disorder (unknown type) as a child and diagnosed with depression and anxiety at age 14 after her mother passed away due to a drug overdose. CSW expressed condolences. CSW inquired about mental health treatment history. MOB reports she was prescribed medication "a long time ago"   but was unable to recall medication, sharing that she does not like taking medication. MOB reports she experienced Perinatal depression during and after her 2 last pregnancies, sharing that she had to terminate her last pregnancy after 3 months due to health complications. MOB became tearful while sharing and CSW provided emotional support. MOB recalled that after giving birth to her daughter 2/22, she had difficulty connecting with her but reports these feelings have since resolved.   MOB reports she experienced depression symptoms in the beginning of her current pregnancy but have since resolved. MOB reports she feels "normal"  now. MOB shares she has met with [facility] Lynnea Ferrier LCSW at [place] during her pregnancy which she reports as helpful. MOB declined additional mental health resources, sharing she prefers to contact her OBGYN if mental health concerns arise. MOB identified FOB, her dad, sister, and grandmother as supports. MOB denied current SI/HI.  CSW informed MOB about hospital drug screen policy due to documented marijuana use during pregnancy. CSW informed MOB that infant's UDS was negative for all illegal substances and CDS would be monitored. A CPS report would be made if warranted. MOB expressed understanding. CSW inquired about substance use during pregnancy. MOB reports she was extremely sick during pregnancy and could not keep food down. MOB reports she smoked marijuana the last 3 months of her pregnancy daily, last use was this past week due to nausea and to be able to eat. MOB became tearful, sharing that she tried several nausea medications but none of them helped. MOB expressed guilt for smoking marijuana during pregnancy, sharing that she did not have to do this with any of her previous pregnancies. MOB reports she does not plan to continue to smoke marijuana now that her nausea symptoms have subsided. MOB denied using other illegal substances during pregnancy. CSW inquired about prior CPS history, MOB denied CPS history.  MOB reports she has all needed items for infant, including a car seat and bassinet. MOB has chosen [facility] for infant's follow up care. MOB receives [facility] and food stamps, CSW encouraged MOB to notify her caseworker of infant's birth to add infant to benefits.  CSW provided education regarding the baby blues period vs. perinatal mood disorders, discussed treatment and gave resources for mental health follow up if concerns arise.  CSW recommends self-evaluation during the postpartum time period using the New Mom Checklist from Postpartum Progress and encouraged MOB to contact a  medical professional if symptoms are noted at any time.    CSW provided review of Sudden Infant Death Syndrome (SIDS) precautions.    CSW identifies no further need for intervention and no barriers to discharge at this time.  CSW Plan/Description:  No Further Intervention Required/No Barriers to Discharge, CSW Will Continue to Monitor Umbilical Cord Tissue Drug Screen Results and Make Report if [facility] [place], Perinatal Mood and Anxiety Disorder (PMADs) Education, Sudden Infant Death Syndrome (SIDS) Education    Kenna Gilbert [place], [place] 10/09/22, 5:09 PM

## 2022-10-15 ENCOUNTER — Other Ambulatory Visit (HOSPITAL_COMMUNITY): Payer: Self-pay

## 2022-10-15 MED ORDER — IBUPROFEN 600 MG PO TABS
600.0000 mg | ORAL_TABLET | Freq: Four times a day (QID) | ORAL | 0 refills | Status: DC
  Filled 2022-10-15: qty 30, 8d supply, fill #0

## 2022-10-15 MED ORDER — ACETAMINOPHEN 325 MG PO TABS
650.0000 mg | ORAL_TABLET | ORAL | 0 refills | Status: DC | PRN
  Filled 2022-10-15: qty 30, 3d supply, fill #0

## 2022-10-15 MED ORDER — BENZOCAINE-MENTHOL 20-0.5 % EX AERO
1.0000 | INHALATION_SPRAY | CUTANEOUS | 0 refills | Status: DC | PRN
  Filled 2022-10-15: qty 78, fill #0

## 2022-10-15 MED ORDER — WITCH HAZEL-GLYCERIN EX PADS
1.0000 | MEDICATED_PAD | CUTANEOUS | 12 refills | Status: DC | PRN
  Filled 2022-10-15: qty 40, fill #0

## 2022-10-15 MED ORDER — SENNOSIDES-DOCUSATE SODIUM 8.6-50 MG PO TABS
2.0000 | ORAL_TABLET | ORAL | 0 refills | Status: DC
  Filled 2022-10-15: qty 30, 15d supply, fill #0

## 2022-10-17 LAB — TYPE AND SCREEN
ABO/RH(D): O POS
Antibody Screen: POSITIVE
Unit division: 0
Unit division: 0

## 2022-10-17 LAB — BPAM RBC
Blood Product Expiration Date: 202311152359
Blood Product Expiration Date: 202311152359
Unit Type and Rh: 5100
Unit Type and Rh: 5100

## 2022-10-19 ENCOUNTER — Encounter: Payer: Medicaid Other | Admitting: Obstetrics and Gynecology

## 2022-10-21 ENCOUNTER — Inpatient Hospital Stay (HOSPITAL_COMMUNITY): Payer: Medicaid Other

## 2022-10-21 ENCOUNTER — Encounter (HOSPITAL_COMMUNITY): Payer: Self-pay | Admitting: Family Medicine

## 2022-10-21 ENCOUNTER — Inpatient Hospital Stay (HOSPITAL_COMMUNITY)
Admission: AD | Admit: 2022-10-21 | Discharge: 2022-10-21 | Disposition: A | Discharge status: Home / Self Care | Payer: Medicaid Other | Attending: Family Medicine | Admitting: Family Medicine

## 2022-10-21 DIAGNOSIS — O99345 Other mental disorders complicating the puerperium: Secondary | ICD-10-CM | POA: Diagnosis present

## 2022-10-21 DIAGNOSIS — R0781 Pleurodynia: Secondary | ICD-10-CM | POA: Insufficient documentation

## 2022-10-21 DIAGNOSIS — O9089 Other complications of the puerperium, not elsewhere classified: Secondary | ICD-10-CM | POA: Insufficient documentation

## 2022-10-21 DIAGNOSIS — F319 Bipolar disorder, unspecified: Secondary | ICD-10-CM | POA: Insufficient documentation

## 2022-10-21 DIAGNOSIS — R0789 Other chest pain: Secondary | ICD-10-CM | POA: Diagnosis not present

## 2022-10-21 LAB — CBC
HCT: 30.9 % — ABNORMAL LOW (ref 36.0–46.0)
Hemoglobin: 9.2 g/dL — ABNORMAL LOW (ref 12.0–15.0)
MCH: 22.7 pg — ABNORMAL LOW (ref 26.0–34.0)
MCHC: 29.8 g/dL — ABNORMAL LOW (ref 30.0–36.0)
MCV: 76.3 fL — ABNORMAL LOW (ref 80.0–100.0)
Platelets: 397 10*3/uL (ref 150–400)
RBC: 4.05 MIL/uL (ref 3.87–5.11)
RDW: 16.1 % — ABNORMAL HIGH (ref 11.5–15.5)
WBC: 6.6 10*3/uL (ref 4.0–10.5)
nRBC: 0 % (ref 0.0–0.2)

## 2022-10-21 LAB — URINALYSIS, ROUTINE W REFLEX MICROSCOPIC
Bilirubin Urine: NEGATIVE
Glucose, UA: NEGATIVE mg/dL
Ketones, ur: NEGATIVE mg/dL
Nitrite: NEGATIVE
Protein, ur: NEGATIVE mg/dL
Specific Gravity, Urine: >1.03 — ABNORMAL HIGH (ref 1.005–1.030)
pH: 5.5 (ref 5.0–8.0)

## 2022-10-21 LAB — COMPREHENSIVE METABOLIC PANEL
ALT: 15 U/L (ref 0–44)
AST: 22 U/L (ref 15–41)
Albumin: 3.4 g/dL — ABNORMAL LOW (ref 3.5–5.0)
Alkaline Phosphatase: 113 U/L (ref 38–126)
Anion gap: 7 (ref 5–15)
BUN: 11 mg/dL (ref 6–20)
CO2: 21 mmol/L — ABNORMAL LOW (ref 22–32)
Calcium: 9.4 mg/dL (ref 8.9–10.3)
Chloride: 110 mmol/L (ref 98–111)
Creatinine, Ser: 0.73 mg/dL (ref 0.44–1.00)
GFR, Estimated: >60 mL/min (ref >60)
Glucose, Bld: 83 mg/dL (ref 70–99)
Potassium: 4.1 mmol/L (ref 3.5–5.1)
Sodium: 138 mmol/L (ref 135–145)
Total Bilirubin: 0.3 mg/dL (ref 0.3–1.2)
Total Protein: 6.9 g/dL (ref 6.5–8.1)

## 2022-10-21 LAB — URINALYSIS, MICROSCOPIC (REFLEX)

## 2022-10-21 NOTE — MAU Provider Note (Signed)
History     CSN: [id number]  Arrival date and time: 10/21/22 1513   Event Date/Time   First Provider Initiated Contact with Patient 10/21/22 1636      Chief Complaint  Patient presents with   Abdominal Pain   Nicole Harris is a 26 y.o. [phone] who is 1 week postpartum presenting with right rib pain. She does not have pain at rest; it is with movement, coughing, sneezing, and laughing. She denies HA, vision changes, nausea, and vomiting. She states she has had bruised ribs in the past. She has not noticed any improvement with ibuprofen because it comes and goes. She is otherwise doing well. Bonding with baby is going well. She is breast feeding; baby noted to be currently cluster feeding.    OB History     Gravida  4   Para  3   Term  3   Preterm      AB  1   Living  3      SAB      IAB  1   Ectopic      Multiple  0   Live Births  3           Past Medical History:  Diagnosis Date   ADHD (attention deficit hyperactivity disorder)    Anemia    Anxiety    Bipolar disorder ([place])    Depression    Headache    Nausea/vomiting in pregnancy 07/10/2018   Stress incontinence in female 02/16/2021    Past Surgical History:  Procedure Laterality Date   ADENOIDECTOMY     TONGUE SURGERY     TONSILLECTOMY      Family History  Problem Relation Age of Onset   Drug abuse Mother    Healthy Father    Cancer Maternal Grandmother    Thyroid disease Maternal Grandmother    Ovarian cancer Maternal Grandmother    Diabetes Paternal Grandmother    Hypertension Paternal Grandfather     Social History   Tobacco Use   Smoking status: Former    Types: Cigarettes    Passive exposure: Past   Smokeless tobacco: Never  Vaping Use   Vaping Use: Never used  Substance Use Topics   Alcohol use: Not Currently    Comment: occasionally   Drug use: Not Currently    Types: Marijuana    Allergies:  Allergies  Allergen Reactions   Latex Rash   Shellfish Allergy  Rash    Medications Prior to Admission  Medication Sig Dispense Refill Last Dose   acetaminophen (TYLENOL) 325 MG tablet Take 2 tablets (650 mg total) by mouth every 4 (four) hours as needed (for pain scale < 4). 30 tablet 0 Past Week   ibuprofen (ADVIL) 600 MG tablet Take 1 tablet (600 mg total) by mouth every 6 (six) hours. 30 tablet 0 10/20/2022   senna-docusate (SENOKOT-S) 8.6-50 MG tablet Take 2 tablets by mouth daily. 30 tablet 0 10/20/2022   benzocaine-Menthol (DERMOPLAST) 20-0.5 % AERO Apply 1 Application topically as needed for irritation (perineal discomfort). 78 g 0 Unknown   ferrous sulfate 325 (65 FE) MG tablet Take 325 mg by mouth every other day.   Unknown   witch hazel-glycerin (TUCKS) pad Apply 1 Application topically as needed for hemorrhoids. 40 each 12 Unknown    Review of Systems  Constitutional:  Negative for activity change and fever.  HENT:  Positive for sneezing.   Eyes:  Negative for visual disturbance.  Respiratory:  Positive  for cough. Negative for shortness of breath.   Cardiovascular:  Negative for chest pain.  Gastrointestinal:  Negative for abdominal pain, constipation, diarrhea, nausea and vomiting.  Genitourinary:  Negative for difficulty urinating.  Musculoskeletal:  Negative for gait problem and myalgias.  Neurological:  Negative for dizziness and headaches.  Psychiatric/Behavioral:  Negative for confusion.    Physical Exam   Blood pressure 123/73, pulse 95, temperature 98.3 F (36.8 C), resp. rate 18, height 5\' 4"  (1.626 m), SpO2 98 %, unknown if currently breastfeeding.  Physical Exam Vitals reviewed.  Constitutional:      General: She is not in acute distress.    Appearance: She is not ill-appearing, toxic-appearing or diaphoretic.  Pulmonary:     Effort: Pulmonary effort is normal. No respiratory distress.     Breath sounds: Normal breath sounds. No stridor. No wheezing or rhonchi.     Comments: Right inferior rib/chest wall tenderness  around ribs 7-9 Chest:     Chest wall: Tenderness present.  Abdominal:     General: Abdomen is flat. Bowel sounds are normal. There is no distension.     Palpations: Abdomen is soft. There is no mass.     Tenderness: There is no abdominal tenderness. There is no guarding or rebound.  Neurological:     Mental Status: She is alert.    PORTABLE CHEST 1 VIEW   COMPARISON:  None Available.   FINDINGS: The lungs are clear. There is no pleural effusion or pneumothorax. The cardiac silhouette is within normal limits. No acute osseous pathology.   IMPRESSION: No active disease.     Electronically Signed   By: Anner Crete M.D.   On: 10/21/2022 17:21  MAU Course  Procedures  MDM  VSS. BP wnl.  CBC, plts, LFTs wnl CXR w/o acute findings  Assessment and Plan  1. Chest wall pain -NSAIDs, ice, and rest -Return precautions given  2. Postpartum care and examination of lactating mother Briefly discussed lidocaine patches but would advice placing near the breast due to nursing.  - Encouraged continue breast feeding and routine PP follow up   [place] 10/21/2022, 4:36 PM

## 2022-10-21 NOTE — MAU Note (Signed)
.  Nicole Harris is a 26 y.o. at Unknown here in MAU reporting: RUQ pain  when she cough,sneezes or moves a certain way that stared after delivery on 10/13/2022.  PP vag delivery. LMP:  Onset of complaint:  1 week Pain score: 10  Vitals:   10/21/22 1559  BP: 115/61  Pulse: 86  Resp: 18  Temp: 98.3 F (36.8 C)     FHT:n/a Lab orders placed from triage:  u/a

## 2022-10-24 ENCOUNTER — Telehealth (HOSPITAL_COMMUNITY): Payer: Self-pay | Admitting: *Deleted

## 2022-10-24 NOTE — Telephone Encounter (Signed)
Left phone voicemail message.  Odis Hollingshead, RN 10-24-2022 at 9:41am

## 2022-10-30 ENCOUNTER — Ambulatory Visit (INDEPENDENT_AMBULATORY_CARE_PROVIDER_SITE_OTHER): Payer: Medicaid Other | Admitting: Licensed Clinical Social Worker

## 2022-10-30 DIAGNOSIS — F53 Postpartum depression: Secondary | ICD-10-CM

## 2022-10-31 ENCOUNTER — Telehealth (INDEPENDENT_AMBULATORY_CARE_PROVIDER_SITE_OTHER): Payer: Medicaid Other | Admitting: Obstetrics

## 2022-10-31 ENCOUNTER — Encounter: Payer: Self-pay | Admitting: Obstetrics

## 2022-10-31 DIAGNOSIS — O9122 Nonpurulent mastitis associated with the puerperium: Secondary | ICD-10-CM | POA: Diagnosis not present

## 2022-10-31 MED ORDER — AMOXICILLIN-POT CLAVULANATE 875-125 MG PO TABS: 1.0000 | ORAL_TABLET | Freq: Two times a day (BID) | ORAL | 0 refills | Status: DC

## 2022-10-31 MED ORDER — IBUPROFEN 800 MG PO TABS: 800.0000 mg | ORAL_TABLET | Freq: Three times a day (TID) | ORAL | 5 refills | Status: DC | PRN

## 2022-10-31 NOTE — Progress Notes (Signed)
I connected with  Lenore Cordia Dassow on 10/31/22 by a video enabled telemedicine application and verified that I am speaking with the correct person using two identifiers.   I discussed the limitations of evaluation and management by telemedicine. The patient expressed understanding and agreed to proceed.   MyChart GYN, 2.5 weeks PP c/o breast tenderness 6-7/10 it hurts to touch, blotchy red, headache 6-10/10, it hurts behind her eyes.  Denies fever, chills, NV.

## 2022-10-31 NOTE — BH Specialist Note (Signed)
Integrated Behavioral Health Follow Up In-Person Visit  MRN: [id number] Name: Nicole Harris  Number of [place] Clinician visits: 6 Session Start time:  1:05pm Session End time: 1:41pm Total time in minutes: 36 mins via phone   Types of Service: Individual psychotherapy and Telephone visit  Interpretor:No. Interpretor Name and Language: none  Subjective: Nicole Harris is a 26 y.o. female accompanied by n/a Patient was referred by Sunday Corn CNM for postpartum depression. Patient reports the following symptoms/concerns: depressed mood, feeling overwhelmed, trouble sleeping, and isolation Duration of problem: over one year ; Severity of problem: mild  Objective: Mood: Depressed and Affect: Appropriate Risk of harm to self or others: No plan to harm self or others  Life Context: Family and Social: Lives with FOB and children School/Work: n/a Self-Care: None Life Changes: Newborn   Patient and/or Family's Strengths/Protective Factors: Concrete supports in place (healthy food, safe environments, etc.)  Goals Addressed: Patient will:  Reduce symptoms of: depression   Increase knowledge and/or ability of: coping skills   Demonstrate ability to: Increase healthy adjustment to current life circumstances  Progress towards Goals: Ongoing  Interventions: Interventions utilized:  Supportive Counseling Standardized Assessments completed: Flavia Shipper Postnatal Depression  Patient and/or Family Response: Ms. Steger reports bonding well with newborn son Darrick Meigs. Ms. Rief reports she is managing her stress and depressive symptoms with delegating task, prioritizing rest and practicing her faith.   Assessment: Patient currently experiencing depression.   Patient may benefit from integrated behavorial health.  Plan: Follow up with behavioral health clinician on : 11/28/2022 Behavioral recommendations: Continue utilizing current coping skills,  prioritize rest, and began journal writing to identify triggers.  Referral(s): [place] (In Clinic) "From scale of 1-10, how likely are you to follow plan?":    Lynnea Ferrier, LCSW

## 2022-10-31 NOTE — Progress Notes (Signed)
GYNECOLOGY VIRTUAL VISIT ENCOUNTER NOTE  Provider location: [facility]   Patient location: Home  I connected with Nicole Harris on 10/31/22 at 11:15 AM EDT by MyChart Video Encounter and verified that I am speaking with the correct person using two identifiers.   I discussed the limitations, risks, security and privacy concerns of performing an evaluation and management service virtually and the availability of in person appointments. I also discussed with the patient that there may be a patient responsible charge related to this service. The patient expressed understanding and agreed to proceed.   History:  Nicole Harris is a 26 y.o. [phone] female being evaluated today for painful and reddened left breast. She is breasty feeding.  She denies any abnormal vaginal discharge, bleeding, pelvic pain or other concerns.       Past Medical History:  Diagnosis Date   ADHD (attention deficit hyperactivity disorder)    Anemia    Anxiety    Bipolar disorder ([place])    Depression    Headache    Nausea/vomiting in pregnancy 07/10/2018   Stress incontinence in female 02/16/2021   Past Surgical History:  Procedure Laterality Date   ADENOIDECTOMY     TONGUE SURGERY     TONSILLECTOMY     The following portions of the patient's history were reviewed and updated as appropriate: allergies, current medications, past family history, past medical history, past social history, past surgical history and problem list.    Review of Systems:  Pertinent items noted in HPI and remainder of comprehensive ROS otherwise negative.  Physical Exam:   General:  Alert, oriented and cooperative. Patient appears to be in no acute distress.  Mental Status: Normal mood and affect. Normal behavior. Normal judgment and thought content.   Respiratory: Normal respiratory effort, no problems with respiration noted  Rest of physical exam deferred due to type of encounter  Labs  and Imaging Results for orders placed or performed during the hospital encounter of 10/21/22 (from the past 336 hour(s))  Urinalysis, Routine w reflex microscopic Urine, Clean Catch   Collection Time: 10/21/22  4:19 PM  Result Value Ref Range   Color, Urine YELLOW YELLOW   APPearance CLEAR CLEAR   Specific Gravity, Urine >1.030 (H) 1.005 - 1.030   pH 5.5 5.0 - 8.0   Glucose, UA NEGATIVE NEGATIVE mg/dL   Hgb urine dipstick LARGE (A) NEGATIVE   Bilirubin Urine NEGATIVE NEGATIVE   Ketones, ur NEGATIVE NEGATIVE mg/dL   Protein, ur NEGATIVE NEGATIVE mg/dL   Nitrite NEGATIVE NEGATIVE   Leukocytes,Ua TRACE (A) NEGATIVE  Urinalysis, Microscopic (reflex)   Collection Time: 10/21/22  4:19 PM  Result Value Ref Range   RBC / HPF 6-10 0 - 5 RBC/hpf   WBC, UA 0-5 0 - 5 WBC/hpf   Bacteria, UA FEW (A) NONE SEEN   Squamous Epithelial / LPF 0-5 0 - 5   Mucus PRESENT    Ca Oxalate Crys, UA PRESENT   CBC   Collection Time: 10/21/22  5:06 PM  Result Value Ref Range   WBC 6.6 4.0 - 10.5 K/uL   RBC 4.05 3.87 - 5.11 MIL/uL   Hemoglobin 9.2 (L) 12.0 - 15.0 g/dL   HCT 30.9 (L) 36.0 - 46.0 %   MCV 76.3 (L) 80.0 - 100.0 fL   MCH 22.7 (L) 26.0 - 34.0 pg   MCHC 29.8 (L) 30.0 - 36.0 g/dL   RDW 16.1 (H) 11.5 - 15.5 %  Platelets 397 150 - 400 K/uL   nRBC 0.0 0.0 - 0.2 %  Comprehensive metabolic panel   Collection Time: 10/21/22  5:06 PM  Result Value Ref Range   Sodium 138 135 - 145 mmol/L   Potassium 4.1 3.5 - 5.1 mmol/L   Chloride 110 98 - 111 mmol/L   CO2 21 (L) 22 - 32 mmol/L   Glucose, Bld 83 70 - 99 mg/dL   BUN 11 6 - 20 mg/dL   Creatinine, Ser 0.73 0.44 - 1.00 mg/dL   Calcium 9.4 8.9 - 10.3 mg/dL   Total Protein 6.9 6.5 - 8.1 g/dL   Albumin 3.4 (L) 3.5 - 5.0 g/dL   AST 22 15 - 41 U/L   ALT 15 0 - 44 U/L   Alkaline Phosphatase 113 38 - 126 U/L   Total Bilirubin 0.3 0.3 - 1.2 mg/dL   GFR, Estimated >60 >60 mL/min   Anion gap 7 5 - 15   DG CHEST PORT 1 VIEW  Result Date:  10/21/2022 CLINICAL DATA:  Right rib pain. EXAM: PORTABLE CHEST 1 VIEW COMPARISON:  None Available. FINDINGS: The lungs are clear. There is no pleural effusion or pneumothorax. The cardiac silhouette is within normal limits. No acute osseous pathology. IMPRESSION: No active disease. Electronically Signed   By: Anner Crete M.D.   On: 10/21/2022 17:21       Assessment and Plan:     1. Mastitis associated with childbirth, delivered Rx: - amoxicillin-clavulanate (AUGMENTIN) 875-125 MG tablet; Take 1 tablet by mouth 2 (two) times daily.  Dispense: 14 tablet; Refill: 0 - ibuprofen (ADVIL) 800 MG tablet; Take 1 tablet (800 mg total) by mouth every 8 (eight) hours as needed.  Dispense: 30 tablet; Refill: 5  2. Postpartum care following vaginal delivery     Follow up in 2 weeks   I discussed the assessment and treatment plan with the patient. The patient was provided an opportunity to ask questions and all were answered. The patient agreed with the plan and demonstrated an understanding of the instructions.   The patient was advised to call back or seek an in-person evaluation/go to the ED if the symptoms worsen or if the condition fails to improve as anticipated.  I have spent a total of 10 minutes of non-face-to-face time, excluding clinical staff time, reviewing notes and preparing to see patient, ordering tests and/or medications, and counseling the patient.    Baltazar Najjar, MD Center for [facility], [place], [facility] 10/31/22

## 2022-11-02 ENCOUNTER — Telehealth: Payer: Self-pay | Admitting: *Deleted

## 2022-11-02 NOTE — Telephone Encounter (Signed)
TC from pt with previous DX mastitis and TX with Augmentin starting 10/31/22 (pm dose). Reports had fever over 101 but took Tylenol so now gone. Had dizziness but is resolved now. Consulted Dr. Jodi Mourning. He recommends pt seek care in MAU. Pt reports unable to get to MAU and concerned because infant will only breastfeed, won't take bottle. Encouraged to seek care but if not possible, advised taking AM dose Augmentin, hydrating today and checking for fever when Tylenol wears off. If fever returns, redness on breast spreads, or dizziness returns pt is to seek care immediately. Pt verbalized understanding.

## 2022-11-05 ENCOUNTER — Ambulatory Visit: Payer: Medicaid Other | Attending: Cardiology | Admitting: Cardiology

## 2022-11-12 ENCOUNTER — Encounter: Payer: Self-pay | Admitting: Cardiology

## 2022-11-15 ENCOUNTER — Encounter: Payer: Self-pay | Admitting: Obstetrics

## 2022-11-16 ENCOUNTER — Other Ambulatory Visit: Payer: Self-pay | Admitting: Emergency Medicine

## 2022-11-16 DIAGNOSIS — N898 Other specified noninflammatory disorders of vagina: Secondary | ICD-10-CM

## 2022-11-16 MED ORDER — FLUCONAZOLE 150 MG PO TABS: 150.0000 mg | ORAL_TABLET | Freq: Once | ORAL | 0 refills | Status: AC

## 2022-11-16 NOTE — Progress Notes (Signed)
Pt reports vaginal itching following abx course. Treated for yeast per protocol.

## 2022-11-28 ENCOUNTER — Ambulatory Visit: Payer: Medicaid Other | Admitting: Obstetrics and Gynecology

## 2023-01-01 ENCOUNTER — Ambulatory Visit: Payer: Medicaid Other | Admitting: Obstetrics & Gynecology

## 2023-01-28 ENCOUNTER — Other Ambulatory Visit: Payer: Self-pay

## 2023-01-28 DIAGNOSIS — B3731 Acute candidiasis of vulva and vagina: Secondary | ICD-10-CM

## 2023-01-28 MED ORDER — FLUCONAZOLE 150 MG PO TABS: 150.0000 mg | ORAL_TABLET | Freq: Once | ORAL | 0 refills | Status: AC

## 2023-01-28 NOTE — Progress Notes (Signed)
Patient is taking an antibiotic and requesting an rx for a "yeast infection". She has the flu and unable come into the office for testing.  Diflucan sent per protocol.
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
## 2023-05-10 ENCOUNTER — Other Ambulatory Visit: Payer: Self-pay

## 2023-05-10 ENCOUNTER — Encounter: Payer: Self-pay | Admitting: Physical Therapy

## 2023-05-10 ENCOUNTER — Ambulatory Visit: Payer: Medicaid Other | Attending: Orthopaedic Surgery | Admitting: Physical Therapy

## 2023-05-10 DIAGNOSIS — M25512 Pain in left shoulder: Secondary | ICD-10-CM | POA: Insufficient documentation

## 2023-05-10 DIAGNOSIS — M25612 Stiffness of left shoulder, not elsewhere classified: Secondary | ICD-10-CM | POA: Insufficient documentation

## 2023-05-10 DIAGNOSIS — M6281 Muscle weakness (generalized): Secondary | ICD-10-CM | POA: Diagnosis present
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
## 2023-05-23 ENCOUNTER — Encounter: Payer: Self-pay | Admitting: Physical Therapy

## 2023-05-23 ENCOUNTER — Ambulatory Visit: Payer: Medicaid Other | Admitting: Physical Therapy

## 2023-05-23 DIAGNOSIS — M25512 Pain in left shoulder: Secondary | ICD-10-CM

## 2023-05-23 DIAGNOSIS — M6281 Muscle weakness (generalized): Secondary | ICD-10-CM

## 2023-05-23 DIAGNOSIS — M25612 Stiffness of left shoulder, not elsewhere classified: Secondary | ICD-10-CM

## 2023-05-23 NOTE — Patient Instructions (Signed)
Posture Tips DO: - stand tall and erect - keep chin tucked in - keep head and shoulders in alignment - check posture regularly in mirror or large window - pull head back against headrest in car seat;  Change your position often.  Sit with lumbar support. DON'T: - slouch or slump while watching TV or reading - sit, stand or lie in one position  for too long;  Sitting is especially hard on the spine so if you sit at a desk/use the computer, then stand up often!   Copyright  VHI. All rights reserved.  Posture - Standing   Good posture is important. Avoid slouching and forward head thrust. Maintain curve in low back and align ears over shoul- ders, hips over ankles.  Pull your belly button in toward your back bone. Stand with ribs lifted up like a golden thread from your sternum to the sky.  Even weight in your feet.   Chin down   Copyright  VHI. All rights reserved.  Posture - Sitting   Sit upright, head facing forward. Try using a roll to support lower back. Keep shoulders relaxed, and avoid rounded back. Keep hips level with knees. Avoid crossing legs for long periods. Dont cross legs   Sit on sit bones not your tail bone   Copyright  VHI. All rights reserved.    Garen Lah, PT, A[facility] Certified Exercise Expert for the Aging Adult  05/23/23 10:50 AM Phone: [phone] Fax: [phone]

## 2023-05-28 ENCOUNTER — Encounter: Payer: Self-pay | Admitting: Physical Therapy

## 2023-05-28 ENCOUNTER — Ambulatory Visit: Payer: Medicaid Other | Admitting: Physical Therapy

## 2023-05-28 DIAGNOSIS — M25512 Pain in left shoulder: Secondary | ICD-10-CM | POA: Diagnosis not present

## 2023-05-28 DIAGNOSIS — M6281 Muscle weakness (generalized): Secondary | ICD-10-CM

## 2023-05-28 DIAGNOSIS — M25612 Stiffness of left shoulder, not elsewhere classified: Secondary | ICD-10-CM
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
## 2023-05-29 ENCOUNTER — Ambulatory Visit (INDEPENDENT_AMBULATORY_CARE_PROVIDER_SITE_OTHER): Payer: Medicaid Other

## 2023-05-29 ENCOUNTER — Other Ambulatory Visit (HOSPITAL_COMMUNITY)
Admission: RE | Admit: 2023-05-29 | Discharge: 2023-05-29 | Disposition: A | Discharge status: Home / Self Care | Payer: Medicaid Other | Source: Ambulatory Visit | Attending: Obstetrics & Gynecology | Admitting: Obstetrics & Gynecology

## 2023-05-29 VITALS — BP 122/72 | HR 87 | Ht 64.0 in | Wt 131.0 lb

## 2023-05-29 DIAGNOSIS — N898 Other specified noninflammatory disorders of vagina: Secondary | ICD-10-CM | POA: Diagnosis not present

## 2023-05-29 NOTE — Progress Notes (Addendum)
SUBJECTIVE:  27 y.o. female complains of white vaginal discharge and discomfort for 5 day(s). Denies abnormal vaginal bleeding or significant pelvic pain or fever. No UTI symptoms. Denies history of known exposure to STD.  Patient's last menstrual period was 05/15/2023 (exact date).  OBJECTIVE:  She appears well, afebrile. Urine dipstick: not done.  ASSESSMENT:  Vaginal Discharge  Vaginal Discomfort   PLAN:  GC, chlamydia, trichomonas, BVAG, CVAG probe sent to lab. Treatment: To be determined once lab results are received ROV prn if symptoms persist or worsen.
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
## 2023-05-30 ENCOUNTER — Ambulatory Visit: Payer: Medicaid Other | Admitting: Physical Therapy

## 2023-05-30 DIAGNOSIS — M25612 Stiffness of left shoulder, not elsewhere classified: Secondary | ICD-10-CM

## 2023-05-30 DIAGNOSIS — M25512 Pain in left shoulder: Secondary | ICD-10-CM | POA: Diagnosis not present

## 2023-05-30 DIAGNOSIS — M6281 Muscle weakness (generalized): Secondary | ICD-10-CM

## 2023-05-30 LAB — CERVICOVAGINAL ANCILLARY ONLY
Bacterial Vaginitis (gardnerella): POSITIVE — AB
Candida Glabrata: NEGATIVE
Candida Vaginitis: NEGATIVE
Chlamydia: NEGATIVE
Comment: NEGATIVE
Comment: NEGATIVE
Comment: NEGATIVE
Comment: NEGATIVE
Comment: NEGATIVE
Comment: NORMAL
Neisseria Gonorrhea: NEGATIVE
Trichomonas: NEGATIVE

## 2023-06-03 ENCOUNTER — Other Ambulatory Visit: Payer: Self-pay | Admitting: *Deleted

## 2023-06-03 DIAGNOSIS — B9689 Other specified bacterial agents as the cause of diseases classified elsewhere: Secondary | ICD-10-CM

## 2023-06-03 MED ORDER — METRONIDAZOLE 500 MG PO TABS: 500.0000 mg | ORAL_TABLET | Freq: Two times a day (BID) | ORAL | 0 refills | Status: DC

## 2023-06-03 NOTE — Progress Notes (Signed)
TC from pt regarding +BV on swab. RX Flagyl sent per protocol.

## 2023-06-04 ENCOUNTER — Ambulatory Visit: Payer: Medicaid Other | Admitting: Physical Therapy

## 2023-06-06 ENCOUNTER — Ambulatory Visit: Payer: Medicaid Other | Admitting: Physical Therapy

## 2023-06-11 ENCOUNTER — Encounter: Payer: Self-pay | Admitting: Physical Therapy

## 2023-06-11 ENCOUNTER — Ambulatory Visit: Payer: Medicaid Other | Attending: Orthopaedic Surgery | Admitting: Physical Therapy

## 2023-06-11 DIAGNOSIS — M6281 Muscle weakness (generalized): Secondary | ICD-10-CM | POA: Diagnosis present

## 2023-06-11 DIAGNOSIS — M25612 Stiffness of left shoulder, not elsewhere classified: Secondary | ICD-10-CM | POA: Diagnosis present

## 2023-06-11 DIAGNOSIS — M25512 Pain in left shoulder: Secondary | ICD-10-CM | POA: Diagnosis present
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
## 2023-06-13 ENCOUNTER — Encounter: Payer: Self-pay | Admitting: Physical Therapy

## 2023-06-13 ENCOUNTER — Ambulatory Visit: Payer: Medicaid Other | Admitting: Physical Therapy

## 2023-06-13 DIAGNOSIS — M25612 Stiffness of left shoulder, not elsewhere classified: Secondary | ICD-10-CM

## 2023-06-13 DIAGNOSIS — M25512 Pain in left shoulder: Secondary | ICD-10-CM | POA: Diagnosis not present

## 2023-06-13 DIAGNOSIS — M6281 Muscle weakness (generalized): Secondary | ICD-10-CM
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
## 2023-06-18 ENCOUNTER — Ambulatory Visit: Payer: Medicaid Other | Admitting: Physical Therapy

## 2023-06-19 ENCOUNTER — Ambulatory Visit: Payer: Medicaid Other | Admitting: Physical Therapy

## 2023-06-19 ENCOUNTER — Telehealth: Payer: Self-pay | Admitting: Physical Therapy

## 2023-06-19 NOTE — Telephone Encounter (Signed)
Called pt re: this morning's missed appt - left voicemail with reminder for next appt, advised on attendance policy, and left office call back number. Encouraged to call back with any questions or concerns, or if needs to reschedule
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
## 2023-06-26 ENCOUNTER — Encounter: Payer: Self-pay | Admitting: Physical Therapy

## 2023-06-26 ENCOUNTER — Ambulatory Visit: Payer: Medicaid Other | Admitting: Physical Therapy

## 2023-06-26 DIAGNOSIS — M25612 Stiffness of left shoulder, not elsewhere classified: Secondary | ICD-10-CM

## 2023-06-26 DIAGNOSIS — M6281 Muscle weakness (generalized): Secondary | ICD-10-CM

## 2023-06-26 DIAGNOSIS — M25512 Pain in left shoulder: Secondary | ICD-10-CM | POA: Diagnosis not present

## 2023-07-02 ENCOUNTER — Ambulatory Visit: Payer: Medicaid Other | Attending: Orthopaedic Surgery | Admitting: Physical Therapy

## 2023-07-02 ENCOUNTER — Telehealth: Payer: Self-pay | Admitting: Physical Therapy

## 2023-07-02 DIAGNOSIS — M6281 Muscle weakness (generalized): Secondary | ICD-10-CM | POA: Insufficient documentation

## 2023-07-02 DIAGNOSIS — M25512 Pain in left shoulder: Secondary | ICD-10-CM | POA: Insufficient documentation

## 2023-07-02 DIAGNOSIS — M25612 Stiffness of left shoulder, not elsewhere classified: Secondary | ICD-10-CM | POA: Insufficient documentation

## 2023-07-02 NOTE — Telephone Encounter (Signed)
Contacted patient regarding no show. She was up late with her sick child and overslept for her appointment. She plans to be at her next appointment scheduled for tomorrow.

## 2023-07-03 ENCOUNTER — Ambulatory Visit: Payer: Medicaid Other | Admitting: Physical Therapy
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
## 2023-07-09 ENCOUNTER — Encounter: Payer: Self-pay | Admitting: Physical Therapy

## 2023-07-09 ENCOUNTER — Ambulatory Visit: Payer: Medicaid Other | Admitting: Physical Therapy

## 2023-07-09 DIAGNOSIS — M25612 Stiffness of left shoulder, not elsewhere classified: Secondary | ICD-10-CM

## 2023-07-09 DIAGNOSIS — M6281 Muscle weakness (generalized): Secondary | ICD-10-CM

## 2023-07-09 DIAGNOSIS — M25512 Pain in left shoulder: Secondary | ICD-10-CM

## 2023-07-11 ENCOUNTER — Ambulatory Visit: Payer: Medicaid Other | Admitting: Physical Therapy
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
## 2023-07-16 ENCOUNTER — Encounter: Payer: Self-pay | Admitting: Physical Therapy

## 2023-07-16 ENCOUNTER — Ambulatory Visit: Payer: Medicaid Other | Admitting: Physical Therapy

## 2023-07-16 DIAGNOSIS — M25512 Pain in left shoulder: Secondary | ICD-10-CM

## 2023-07-16 DIAGNOSIS — M6281 Muscle weakness (generalized): Secondary | ICD-10-CM

## 2023-07-16 DIAGNOSIS — M25612 Stiffness of left shoulder, not elsewhere classified: Secondary | ICD-10-CM

## 2023-07-19 ENCOUNTER — Encounter: Payer: Medicaid Other | Admitting: Physical Therapy

## 2023-07-23 ENCOUNTER — Encounter: Payer: Self-pay | Admitting: Physical Therapy

## 2023-07-23 ENCOUNTER — Ambulatory Visit: Payer: Medicaid Other | Admitting: Physical Therapy

## 2023-07-23 DIAGNOSIS — M6281 Muscle weakness (generalized): Secondary | ICD-10-CM

## 2023-07-23 DIAGNOSIS — M25512 Pain in left shoulder: Secondary | ICD-10-CM

## 2023-07-23 DIAGNOSIS — M25612 Stiffness of left shoulder, not elsewhere classified: Secondary | ICD-10-CM
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
## 2023-07-25 ENCOUNTER — Ambulatory Visit: Payer: Medicaid Other | Admitting: Physical Therapy
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
## 2023-07-30 ENCOUNTER — Ambulatory Visit: Payer: Medicaid Other | Admitting: Physical Therapy

## 2023-07-30 DIAGNOSIS — M25612 Stiffness of left shoulder, not elsewhere classified: Secondary | ICD-10-CM

## 2023-07-30 DIAGNOSIS — M25512 Pain in left shoulder: Secondary | ICD-10-CM

## 2023-07-30 DIAGNOSIS — M6281 Muscle weakness (generalized): Secondary | ICD-10-CM

## 2023-08-01 ENCOUNTER — Ambulatory Visit: Payer: Medicaid Other | Admitting: Physical Therapy

## 2023-08-08 ENCOUNTER — Ambulatory Visit: Payer: Medicaid Other | Admitting: Physical Therapy

## 2023-09-05 ENCOUNTER — Ambulatory Visit: Payer: Medicaid Other | Admitting: Physical Therapy

## 2023-09-19 ENCOUNTER — Ambulatory Visit: Payer: Medicaid Other | Attending: Orthopaedic Surgery | Admitting: Physical Therapy

## 2023-10-02 NOTE — Therapy (Signed)
OUTPATIENT PHYSICAL THERAPY CERVICAL EVALUATION   Patient Name: Nicole Harris MRN: [id number] DOB:[birth year], 27 y.o., female Today's Date: 10/04/2023  END OF SESSION:  PT End of Session - 10/04/23 1101     Visit Number 1    Number of Visits 13    Date for PT Re-Evaluation 11/15/23    Authorization Type healthy blue (11 visits used previously)    Authorization Time Period auth tbd    PT Start Time 1102    PT Stop Time 1155    PT Time Calculation (min) 53 min    Activity Tolerance Patient tolerated treatment well;No increased pain;Patient limited by pain    Behavior During Therapy [facility] for tasks assessed/performed             Past Medical History:  Diagnosis Date   ADHD (attention deficit hyperactivity disorder)    Anemia    Anxiety    Bipolar disorder (HCC)    Depression    Headache    Nausea/vomiting in pregnancy 07/10/2018   Stress incontinence in female 02/16/2021   Past Surgical History:  Procedure Laterality Date   ADENOIDECTOMY     TONGUE SURGERY     TONSILLECTOMY     Patient Active Problem List   Diagnosis Date Noted   Anemia of mother in pregnancy, postpartum condition 10/14/2022   [redacted] weeks gestation of pregnancy 10/13/2022   Sciatica of right side 07/23/2022   Low grade squamous intraepithelial lesion (LGSIL) on cervical Pap smear 05/09/2022   Regularly irregular pulse rhythym 04/28/2022   Supervision of other normal pregnancy, antepartum 04/16/2022   Vaginal delivery 02/07/2021   History of depression 10/19/2020   Hx of delivery by vacuum extraction, currently pregnant 09/14/2020   Nausea and vomiting in pregnancy 07/10/2018    PCP: no PCP in chart  REFERRING PROVIDER: Margart Sickles, PA-C  REFERRING DIAG: LT UE CERVICAL RADICULOPATHY  THERAPY DIAG:  Cervicalgia  Left shoulder pain, unspecified chronicity  Muscle weakness (generalized)  Rationale for Evaluation and Treatment: Rehabilitation  ONSET DATE: 2021  SUBJECTIVE:                                                                                                                                                                                                          SUBJECTIVE STATEMENT: Pt arrives w/ referral for cervical radiculopathy after following up with provider, recent discharge from PT with our clinic in July 2024. She states she was unable to get MRI due to insurance issues but did have an XR done which she states showed degenerative changes.  Pt notes that in time away from PT pain seems to be worsening although reports overall quality of symptoms seems about the same - continued difficulty with lifting, reaching. Difficulty sleeping due to discomfort in supine which prompts B hand numbness. She states she is still doing HEP from prior bout of PT. States she seems to be having a bit more RUE symptoms than before, noticing pain is sometimes crossing between shoulders posteriorly.  She denies any new headaches, no difficulty with swallowing/speech, no visual changes.  Hand dominance: Left  PERTINENT HISTORY:  R sided sciatica, L RC issues  PAIN:  Are you having pain: 2/10 resting pain Location/description: L neck/shoulder, aching; occasionally shooting into UE, L>R Best-worst over past week: 2-10+/10  - aggravating factors: sleeping (lying supine), reaching, more extension based posture, lifting - Easing factors: cessation of provocative activities     PRECAUTIONS: None    WEIGHT BEARING RESTRICTIONS: No  FALLS:  Has patient fallen in last 6 months? 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  PLOF: Independent  PATIENT GOALS: reduce pain, be able to do more with childcare  NEXT MD VISIT: TBD per pt report  OBJECTIVE:   DIAGNOSTIC FINDINGS:  Unable to view in chart During recent episode of care, pt endorsed MRI showing rotator cuff tendinitis On  evaluation for this episode of care: Pt reports cervical XR showing some degeneration, no other issues that she recalls   PATIENT SURVEYS:  FOTO 54 current, 63 predicted  COGNITION: Overall cognitive status: Within functional limits for tasks assessed  SENSATION/NEURO: Light touch intact BIL UE Finger<>nose testing unremarkable either UE  No clonus either LE Negative hoffmann and tromner sign BIL Dysdiadochokinesia screening unremarkable No ataxia with gait  POSTURE: forward head, rounded shoulders BIL, guarded UE posture  PALPATION: Concordant tenderness R middle trap, infraspinatus, rhomboid, thoracic paraspinals;   CERVICAL ROM:   Active ROM A/PROM (deg) eval  Flexion 100%  Extension 100# mild shoulder pain  Right lateral flexion 75% s  Left lateral flexion 50% *  Right rotation 55 deg  Left rotation 39 deg   (Blank rows = not tested) (Key: WFL = within functional limits not formally assessed, * = concordant pain, s = stiffness/stretching sensation, NT = not tested) Comment:   UPPER EXTREMITY ROM:  A/PROM Right eval Left eval  Shoulder flexion 146 deg 115 deg *  Shoulder abduction    Shoulder internal rotation (functional combo)    Shoulder external rotation (functional combo)    Elbow flexion    Elbow extension    Wrist flexion    Wrist extension     (Blank rows = not tested) (Key: WFL = within functional limits not formally assessed, * = concordant pain, s = stiffness/stretching sensation, NT = not tested)  Comments:    UPPER EXTREMITY MMT:  MMT Right eval Left eval  Shoulder flexion    Shoulder extension 4+ 4 anterior shoulder pain  Shoulder abduction    Shoulder extension    Shoulder internal rotation 5 5*  Shoulder external rotation 4+ 4+ *  Elbow flexion    Elbow extension    Grip strength 75# 75#  (Blank rows = not tested)  (Key: WFL = within functional limits not formally assessed, * = concordant pain, s = stiffness/stretching sensation,  NT = not tested)  Comments:   CERVICAL SPECIAL TESTS:  Negative spurlings, does provoke some transient  neck discomfort/tightness   FUNCTIONAL TESTS:  Overhead reach: limited BIL L>R   TODAY'S TREATMENT:                                                                                                                              [facility] Adult PT Treatment:                                                DATE: 10/04/23 Therapeutic Exercise: Cervical retraction x8 cues for reduced compensations, posture Scapular retraction x8 cues for arm positioning to improve comfort HEP handout + education   PATIENT EDUCATION:  Education details: Pt education on PT impairments, prognosis, and POC. Informed consent. Rationale for interventions, safe/appropriate HEP performance Person educated: Patient Education method: Explanation, Demonstration, Tactile cues, Verbal cues, and Handouts Education comprehension: verbalized understanding, returned demonstration, verbal cues required, tactile cues required, and needs further education    HOME EXERCISE PROGRAM: Access Code: [email or web address] URL: [email or web address] Date: 10/04/2023 Prepared by: Fransisco Hertz  Exercises - Seated Scapular Retraction  - 2-3 x daily - 7 x weekly - 1 sets - 8 reps - Seated Cervical Retraction  - 2-3 x daily - 7 x weekly - 1 sets - 8 reps  ASSESSMENT:  CLINICAL IMPRESSION: Patient is a pleasant 27 y.o. woman who was seen today for physical therapy evaluation and treatment for cervical radiculopathy, recently discharged from our clinic after undergoing treatment for L shoulder issues. Pt describes worsening of symptoms during time away, now having occasional BIL UE numbness mostly with overhead activities and when lying supine. Red flag questioning/screening reassuring as above. Continues to describe difficulty with household tasks and childcare. On exam, pt demonstrates limitations in cervical mobility, L GH mobility. Quite  tender to palpation R rhomboid, middle trap, and infraspinatus. Negative spurling's test and no affect on UE symptoms with gentle cervical distraction. Does demonstrate poor postural tolerance to upright/extension and lying supine. Tolerates HEP well overall although does require some cues/assist with UE positioning during scapular retractions due to discomfort in anterior shoulder initially. No adverse events, symptoms are fairly irritable throughout but mitigated by rest breaks, pt denies any increase in symptoms on departure. Recommend trial of skilled PT to address aforementioned deficits with aim of improving functional tolerance and reducing pain with typical activities. Pt departs today's session in no acute distress, all voiced concerns/questions addressed appropriately from PT perspective.    OBJECTIVE IMPAIRMENTS: decreased activity tolerance, decreased endurance, decreased mobility, decreased ROM, decreased strength, hypomobility, impaired perceived functional ability, impaired UE functional use, postural dysfunction, and pain.   ACTIVITY LIMITATIONS: carrying, lifting, reach over head, hygiene/grooming, and caring for others  PARTICIPATION LIMITATIONS: meal prep, cleaning, laundry, driving, community activity, and occupation  PERSONAL FACTORS: Time since onset of injury/illness/exacerbation are also affecting patient's functional outcome.   REHAB POTENTIAL: Good  CLINICAL DECISION MAKING: Evolving/moderate complexity  EVALUATION COMPLEXITY: Moderate   GOALS: Goals reviewed with patient? Yes  SHORT TERM GOALS: Target date: 10/25/2023 Pt will demonstrate appropriate understanding and performance of initially prescribed HEP in order to facilitate improved independence with management of symptoms.  Baseline: HEP provided on eval Goal status: INITIAL   2. Pt will score greater than or equal to 58 on FOTO in order to demonstrate improved perception of function due to  symptoms.  Baseline: 54  Goal status: INITIAL    LONG TERM GOALS: Target date: 11/15/2023  Pt will score 63 on FOTO in order to demonstrate improved perception of function due to symptoms. Baseline: 54 Goal status: INITIAL  2. Pt will demonstrate at least 50 degrees of active cervical rotation ROM bilaterallyin order to demonstrate improved environmental awareness and safety with driving.  Baseline: see ROM chart above Goal status: INITIAL  3. Pt will demonstrate at least 140 degrees of L shoulder flexion AROM for improved symmetry of UE strength and improved tolerance to overhead reaching. Baseline: see ROM chart above Goal status: INITIAL   4. Pt will demonstrate appropriate performance of final prescribed HEP in order to facilitate improved self-management of symptoms post-discharge.   Baseline: initial HEP prescribed  Goal status: INITIAL    5. Pt will report at least 50% decrease in overall pain levels in past week in order to facilitate improved tolerance to basic ADLs/mobility.   Baseline: 2-10+/10  Goal status: INITIAL       PLAN:  PT FREQUENCY: 1-2x/week   PT DURATION: 6 weeks    PLANNED INTERVENTIONS: Therapeutic exercises, Therapeutic activity, Neuromuscular re-education, Balance training, Gait training, Patient/Family education, Self Care, Joint mobilization, Joint manipulation, Aquatic Therapy, Dry Needling, Electrical stimulation, Spinal manipulation, Spinal mobilization, Cryotherapy, Moist heat, Taping, Manual therapy, and Re-evaluation  PLAN FOR NEXT SESSION: Review/update HEP PRN. Work into postural extension as able/appropriate, Metallurgist. Symptom modification strategies as indicated/appropriate.    Ashley Murrain PT, DPT 10/04/2023 12:39 PM    Check all possible CPT codes: 16109 - PT Re-evaluation, 97110- Therapeutic Exercise, [phone]- Neuro Re-education, [phone] - Gait Training, [phone] - Manual Therapy, [phone] - Therapeutic  Activities, [phone] - Self Care, [phone] - Physical performance training, and [phone] - Aquatic therapy    Check all conditions that are expected to impact treatment: {Conditions expected to impact treatment:Musculoskeletal disorders   If treatment provided at initial evaluation, no treatment charged due to lack of authorization.

## 2023-10-04 ENCOUNTER — Ambulatory Visit: Payer: Medicaid Other | Attending: Orthopaedic Surgery | Admitting: Physical Therapy

## 2023-10-04 ENCOUNTER — Ambulatory Visit (HOSPITAL_COMMUNITY)
Admission: EM | Admit: 2023-10-04 | Discharge: 2023-10-04 | Disposition: A | Discharge status: Home / Self Care | Payer: Medicaid Other

## 2023-10-04 ENCOUNTER — Other Ambulatory Visit: Payer: Self-pay

## 2023-10-04 ENCOUNTER — Encounter (HOSPITAL_COMMUNITY): Payer: Self-pay | Admitting: Emergency Medicine

## 2023-10-04 ENCOUNTER — Encounter: Payer: Self-pay | Admitting: Physical Therapy

## 2023-10-04 DIAGNOSIS — M25512 Pain in left shoulder: Secondary | ICD-10-CM | POA: Diagnosis present

## 2023-10-04 DIAGNOSIS — L84 Corns and callosities: Secondary | ICD-10-CM | POA: Diagnosis not present

## 2023-10-04 DIAGNOSIS — M25612 Stiffness of left shoulder, not elsewhere classified: Secondary | ICD-10-CM | POA: Diagnosis present

## 2023-10-04 DIAGNOSIS — L853 Xerosis cutis: Secondary | ICD-10-CM

## 2023-10-04 DIAGNOSIS — M6281 Muscle weakness (generalized): Secondary | ICD-10-CM | POA: Insufficient documentation

## 2023-10-04 DIAGNOSIS — M542 Cervicalgia: Secondary | ICD-10-CM | POA: Diagnosis present

## 2023-10-04 NOTE — ED Triage Notes (Signed)
Pt c/o foot rash on left foot that she notice over a month ago. She describes rash as small brown spots especially around big toe.

## 2023-10-04 NOTE — ED Provider Notes (Signed)
[facility]    CSN: [id number] Arrival date & time: 10/04/23  1210      History   Chief Complaint Chief Complaint  Patient presents with   Rash    foot    HPI Nicole Harris is a 27 y.o. female.   Patient presents to clinic complaining of spots to the soles of her feet, especially on her great toes.  Areas have been present for about a month.  She reports they are irritating, not painful.  Denies any trauma, injuries or streaking, swelling, erythema or fevers.  She has been exfoliating and using Aquaphor.  Reports she has never had dry skin to her feet before.  She is concerned over bacterial infection.  Denies any nail changes or pain in-between her toes.   The history is provided by the patient and medical records.  Rash Associated symptoms: no fever     Past Medical History:  Diagnosis Date   ADHD (attention deficit hyperactivity disorder)    Anemia    Anxiety    Bipolar disorder (HCC)    Depression    Headache    Nausea/vomiting in pregnancy 07/10/2018   Stress incontinence in female 02/16/2021    Patient Active Problem List   Diagnosis Date Noted   Anemia of mother in pregnancy, postpartum condition 10/14/2022   [redacted] weeks gestation of pregnancy 10/13/2022   Sciatica of right side 07/23/2022   Low grade squamous intraepithelial lesion (LGSIL) on cervical Pap smear 05/09/2022   Regularly irregular pulse rhythym 04/28/2022   Supervision of other normal pregnancy, antepartum 04/16/2022   Vaginal delivery 02/07/2021   History of depression 10/19/2020   Hx of delivery by vacuum extraction, currently pregnant 09/14/2020   Nausea and vomiting in pregnancy 07/10/2018    Past Surgical History:  Procedure Laterality Date   ADENOIDECTOMY     TONGUE SURGERY     TONSILLECTOMY      OB History     Gravida  4   Para  3   Term  3   Preterm      AB  1   Living  3      SAB      IAB  1   Ectopic      Multiple  0   Live Births  3             Home Medications    Prior to Admission medications   Medication Sig Start Date End Date Taking? Authorizing Provider  acetaminophen (TYLENOL) 325 MG tablet Take 2 tablets (650 mg total) by mouth every 4 (four) hours as needed (for pain scale < 4). 10/15/22   Mercado-Ortiz, Lahoma Crocker, DO  amoxicillin-clavulanate (AUGMENTIN) 875-125 MG tablet Take 1 tablet by mouth 2 (two) times daily. Patient not taking: Reported on 05/29/2023 10/31/22   Brock Bad, MD  benzocaine-Menthol (DERMOPLAST) 20-0.5 % AERO Apply 1 Application topically as needed for irritation (perineal discomfort). 10/15/22   Mercado-Ortiz, Lahoma Crocker, DO  ferrous sulfate 325 (65 FE) MG tablet Take 325 mg by mouth every other day.    [provider]  ibuprofen (ADVIL) 800 MG tablet Take 1 tablet (800 mg total) by mouth every 8 (eight) hours as needed. 10/31/22   Brock Bad, MD  metroNIDAZOLE (FLAGYL) 500 MG tablet Take 1 tablet (500 mg total) by mouth 2 (two) times daily. 06/03/23   Brock Bad, MD  senna-docusate (SENOKOT-S) 8.6-50 MG tablet Take 2 tablets by mouth daily. 10/15/22  Myrtie Hawk, DO  witch hazel-glycerin (TUCKS) pad Apply 1 Application topically as needed for hemorrhoids. 10/15/22   Mercado-Ortiz, Lahoma Crocker, DO    Family History Family History  Problem Relation Age of Onset   Drug abuse Mother    Healthy Father    Cancer Maternal Grandmother    Thyroid disease Maternal Grandmother    Ovarian cancer Maternal Grandmother    Diabetes Paternal Grandmother    Hypertension Paternal Grandfather     Social History Social History   Tobacco Use   Smoking status: Former    Types: Cigarettes    Passive exposure: Past   Smokeless tobacco: Never  Vaping Use   Vaping status: Never Used  Substance Use Topics   Alcohol use: Not Currently    Comment: occasionally   Drug use: Not Currently    Types: Marijuana     Allergies   Latex and Shellfish allergy   Review  of Systems Review of Systems  Constitutional:  Negative for fever.  Skin:  Negative for rash and wound.     Physical Exam Triage Vital Signs ED Triage Vitals  Encounter Vitals Group     BP 10/04/23 1242 100/61     Systolic BP Percentile --      Diastolic BP Percentile --      Pulse Rate 10/04/23 1242 88     Resp 10/04/23 1242 16     Temp 10/04/23 1242 98.1 F (36.7 C)     Temp Source 10/04/23 1242 Oral     SpO2 10/04/23 1242 96 %     Weight --      Height --      Head Circumference --      Peak Flow --      Pain Score 10/04/23 1240 0     Pain Loc --      Pain Education --      Exclude from Growth Chart --    No data found.  Updated Vital Signs BP 100/61 (BP Location: Left Arm)   Pulse 88   Temp 98.1 F (36.7 C) (Oral)   Resp 16   SpO2 96%   Breastfeeding Yes   Visual Acuity Right Eye Distance:   Left Eye Distance:   Bilateral Distance:    Right Eye Near:   Left Eye Near:    Bilateral Near:     Physical Exam Vitals and nursing note reviewed.  Constitutional:      Appearance: Normal appearance.  HENT:     Head: Normocephalic and atraumatic.     Right Ear: External ear normal.     Left Ear: External ear normal.     Nose: Nose normal.     Mouth/Throat:     Mouth: Mucous membranes are moist.  Cardiovascular:     Rate and Rhythm: Normal rate.     Pulses: Normal pulses.          Dorsalis pedis pulses are 2+ on the right side and 2+ on the left side.       Posterior tibial pulses are 2+ on the right side and 2+ on the left side.  Pulmonary:     Effort: Pulmonary effort is normal. No respiratory distress.  Musculoskeletal:        General: Normal range of motion.  Feet:     Right foot:     Skin integrity: Callus and dry skin present.     Toenail Condition: Right toenails are normal.     Left  foot:     Skin integrity: Callus and dry skin present.     Toenail Condition: Left toenails are normal.     Comments: Callus to soles of bilateral medial great  toes.  Minor dry skin noted.  No obvious lesions, rashes, streaking, erythema or swelling. Skin:    General: Skin is warm and dry.     Capillary Refill: Capillary refill takes less than 2 seconds.  Neurological:     General: No focal deficit present.     Mental Status: She is alert.  Psychiatric:        Mood and Affect: Mood normal.        Behavior: Behavior is cooperative.      UC Treatments / Results  Labs (all labs ordered are listed, but only abnormal results are displayed) Labs Reviewed - No data to display  EKG   Radiology No results found.  Procedures Procedures (including critical care time)  Medications Ordered in UC Medications - No data to display  Initial Impression / Assessment and Plan / UC Course  I have reviewed the triage vital signs and the nursing notes.  Pertinent labs & imaging results that were available during my care of the patient were reviewed by me and considered in my medical decision making (see chart for details).  Vitals and triage reviewed, patient is hemodynamically stable.  Patient has calluses and dry skin to her feet, no obvious rashes, lesions or symptoms concerning for infection.  Pedal pulses are 2+, low concern for tinea pedis or onychomycosis, as no nail changes or macerated erythematous rashes between toes.  Provided reassurance.  Symptomatic management for dry skin discussed.  Provided treatment follow-up encouraged if symptoms persist.  Plan of care, follow-up care return precautions given, no questions at this time.     Final Clinical Impressions(s) / UC Diagnoses   Final diagnoses:  Dry skin  Callus of foot     Discharge Instructions      You appear to have some calluses and dry skin on the bottom of your feet.  There does not any bacterial or fungal infections present.  To help with your symptoms please exfoliate daily and you can use a pumice stone to help clear away some of the extra dead skin.  After exfoliation you  can apply a generous amount of unscented lotion like Lubriderm to your feet and place cotton socks on afterwards to help seal in the moisturizer.  You can sleep with the socks on.   If your symptoms persist despite these interventions, please follow-up with a podiatrist.    ED Prescriptions   None    PDMP not reviewed this encounter.   Symeon Puleo, [place], [place] 10/04/23 [phone]

## 2023-10-04 NOTE — Discharge Instructions (Addendum)
You appear to have some calluses and dry skin on the bottom of your feet.  There does not any bacterial or fungal infections present.  To help with your symptoms please exfoliate daily and you can use a pumice stone to help clear away some of the extra dead skin.  After exfoliation you can apply a generous amount of unscented lotion like Lubriderm to your feet and place cotton socks on afterwards to help seal in the moisturizer.  You can sleep with the socks on.   If your symptoms persist despite these interventions, please follow-up with a podiatrist.

## 2023-10-08 ENCOUNTER — Ambulatory Visit: Payer: Medicaid Other | Admitting: Physical Therapy

## 2023-10-08 ENCOUNTER — Telehealth: Payer: Self-pay | Admitting: Physical Therapy

## 2023-10-08 NOTE — Telephone Encounter (Signed)
Spoke to patient regarding no show. There was confusion surrounding her starting appointment. Reviewed next appointment date and time.

## 2023-10-16 ENCOUNTER — Ambulatory Visit: Payer: Medicaid Other | Admitting: Physical Therapy

## 2023-10-16 ENCOUNTER — Encounter: Payer: Self-pay | Admitting: Physical Therapy

## 2023-10-23 ENCOUNTER — Ambulatory Visit: Payer: Medicaid Other | Admitting: Physical Therapy

## 2023-10-23 ENCOUNTER — Ambulatory Visit: Payer: Medicaid Other | Admitting: Family Medicine

## 2023-10-24 NOTE — Therapy (Signed)
OUTPATIENT PHYSICAL THERAPY TREATMENT NOTE   Patient Name: Nicole Harris MRN: [id number] DOB:January 03, 1996, 27 y.o., female Today's Date: 10/25/2023  END OF SESSION:  PT End of Session - 10/25/23 1019     Visit Number 2    Number of Visits 13    Date for PT Re-Evaluation 11/15/23    Authorization Type [employer] (11 visits used previously)    Authorization Time Period 7 visits 10/07/23-12/05/23    Authorization - Visit Number 1    Authorization - Number of Visits 7    PT Start Time 1019   late check in   PT Stop Time 1100    PT Time Calculation (min) 41 min    Activity Tolerance Patient tolerated treatment well;No increased pain;Patient limited by pain    Behavior During Therapy [facility] for tasks assessed/performed              Past Medical History:  Diagnosis Date   ADHD (attention deficit hyperactivity disorder)    Anemia    Anxiety    Bipolar disorder (HCC)    Depression    Headache    Nausea/vomiting in pregnancy 07/10/2018   Stress incontinence in female 02/16/2021   Past Surgical History:  Procedure Laterality Date   ADENOIDECTOMY     TONGUE SURGERY     TONSILLECTOMY     Patient Active Problem List   Diagnosis Date Noted   Anemia of mother in pregnancy, postpartum condition 10/14/2022   [redacted] weeks gestation of pregnancy 10/13/2022   Sciatica of right side 07/23/2022   Low grade squamous intraepithelial lesion (LGSIL) on cervical Pap smear 05/09/2022   Regularly irregular pulse rhythym 04/28/2022   Supervision of other normal pregnancy, antepartum 04/16/2022   Vaginal delivery 02/07/2021   History of depression 10/19/2020   Hx of delivery by vacuum extraction, currently pregnant 09/14/2020   Nausea and vomiting in pregnancy 07/10/2018    PCP: no PCP in chart  REFERRING PROVIDER: Margart Sickles, PA-C  REFERRING DIAG: LT UE CERVICAL RADICULOPATHY  THERAPY DIAG:  Cervicalgia  Left shoulder pain, unspecified chronicity  Muscle weakness  (generalized)  Stiffness of left shoulder, not elsewhere classified  Rationale for Evaluation and Treatment: Rehabilitation  ONSET DATE: 2021  SUBJECTIVE:                                                                                                                                                                                                        Per eval - Pt arrives w/ referral for cervical radiculopathy after following up with provider, recent discharge from  PT with our clinic in July 2024. She states she was unable to get MRI due to insurance issues but did have an XR done which she states showed degenerative changes. Pt notes that in time away from PT pain seems to be worsening although reports overall quality of symptoms seems about the same - continued difficulty with lifting, reaching. Difficulty sleeping due to discomfort in supine which prompts B hand numbness. She states she is still doing HEP from prior bout of PT. States she seems to be having a bit more RUE symptoms than before, noticing pain is sometimes crossing between shoulders posteriorly.  She denies any new headaches, no difficulty with swallowing/speech, no visual changes.  Hand dominance: Left  SUBJECTIVE STATEMENT: 10/25/2023 not feeling much pain right now, still having fluctuating symptoms. Pt states HEP has been going well but doesn't really seem to change symptoms for better or worse. Denies issues after initial evaluation. Has noticed more mid back pain   PERTINENT HISTORY:  R sided sciatica, L RC issues  PAIN:  Are you having pain: unrated mild scapular pain on L  Per eval -  Location/description: L neck/shoulder, aching; occasionally shooting into UE, L>R Best-worst over past week: 2-10+/10  - aggravating factors: sleeping (lying supine), reaching, more extension based posture, lifting - Easing factors: cessation of provocative activities     PRECAUTIONS: None    WEIGHT BEARING RESTRICTIONS:  No  FALLS:  Has patient fallen in last 6 months? 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  PLOF: Independent  PATIENT GOALS: reduce pain, be able to do more with childcare  NEXT MD VISIT: TBD per pt report  OBJECTIVE: (objective measures completed at initial evaluation unless otherwise dated)   DIAGNOSTIC FINDINGS:  Unable to view in chart During recent episode of care, pt endorsed MRI showing rotator cuff tendinitis On evaluation for this episode of care: Pt reports cervical XR showing some degeneration, no other issues that she recalls   PATIENT SURVEYS:  FOTO 54 current, 63 predicted  COGNITION: Overall cognitive status: Within functional limits for tasks assessed  SENSATION/NEURO: Light touch intact BIL UE Finger<>nose testing unremarkable either UE  No clonus either LE Negative hoffmann and tromner sign BIL Dysdiadochokinesia screening unremarkable No ataxia with gait  POSTURE: forward head, rounded shoulders BIL, guarded UE posture  PALPATION: Concordant tenderness R middle trap, infraspinatus, rhomboid, thoracic paraspinals;   CERVICAL ROM:   Active ROM A/PROM (deg) eval  Flexion 100%  Extension 100# mild shoulder pain  Right lateral flexion 75% s  Left lateral flexion 50% *  Right rotation 55 deg  Left rotation 39 deg   (Blank rows = not tested) (Key: WFL = within functional limits not formally assessed, * = concordant pain, s = stiffness/stretching sensation, NT = not tested) Comment:   UPPER EXTREMITY ROM:  A/PROM Right eval Left eval  Shoulder flexion 146 deg 115 deg *  Shoulder abduction    Shoulder internal rotation (functional combo)    Shoulder external rotation (functional combo)    Elbow flexion    Elbow extension    Wrist flexion    Wrist extension     (Blank rows = not tested) (Key: WFL = within functional limits not formally  assessed, * = concordant pain, s = stiffness/stretching sensation, NT = not tested)  Comments:    UPPER EXTREMITY MMT:  MMT Right eval Left eval  Shoulder flexion    Shoulder extension 4+ 4 anterior shoulder pain  Shoulder abduction    Shoulder extension    Shoulder internal rotation 5 5*  Shoulder external rotation 4+ 4+ *  Elbow flexion    Elbow extension    Grip strength 75# 75#  (Blank rows = not tested)  (Key: WFL = within functional limits not formally assessed, * = concordant pain, s = stiffness/stretching sensation, NT = not tested)  Comments:   CERVICAL SPECIAL TESTS:  Negative spurlings, does provoke some transient neck discomfort/tightness   FUNCTIONAL TESTS:  Overhead reach: limited BIL L>R   TODAY'S TREATMENT:                                                                                                                              [facility] Adult PT Treatment:                                                DATE: 10/25/23 Therapeutic Exercise: Supine double ER 2x12 cues for setup red band  Supine horizontal abduction red band 2x8 Red band around wrist BIL scaption min ROM (<90) 2x8 cues for posture and reduced shrugging Seated chin tuck x10 cues for reduced compensation Supine chin tuck x10 cues for form Swiss ball flexion ball up wall 2x5 cues for posture HEP update + education/handout  Manual Therapy: Prone; gentle STM, open palm technique to lower trap, thoracic paraspinals, infraspinatus/teres    [facility] Adult PT Treatment:                                                DATE: 10/04/23 Therapeutic Exercise: Cervical retraction x8 cues for reduced compensations, posture Scapular retraction x8 cues for arm positioning to improve comfort HEP handout + education   PATIENT EDUCATION:  Education details: rationale for interventions, HEP Person educated: Patient Education method: Explanation, Demonstration, Tactile cues, Verbal cues Education comprehension:  verbalized understanding, returned demonstration, verbal cues required, tactile cues required, and needs further education    HOME EXERCISE PROGRAM: Access Code: [email or web address] URL: [email or web address] Date: 10/25/2023 Prepared by: Fransisco Hertz  Exercises - Seated Scapular Retraction  - 2-3 x daily - 7 x weekly - 1 sets - 8 reps - Seated Cervical Retraction  - 2-3 x daily - 7 x weekly - 1 sets - 8 reps - Supine Bilateral Shoulder External Rotation with Resistance around Wrists  - 2-3 x daily - 7 x weekly - 1 sets - 8 reps - Supine Shoulder Horizontal Abduction with Resistance  - 2-3 x daily - 7 x weekly - 1 sets - 8 reps  ASSESSMENT:  CLINICAL IMPRESSION: 10/25/2023 Pt arrives w/o significant resting pain, does  endorse mild scapular pain on L unrated. Continues to endorse fluctuating symptoms. Today concordant scapular pain is provoked with palpation of lower trap and thoracic paraspinals, minimal relief with manual as above. Does note improving scapular pain with modification to supine positioning for periscapular work. Reports some muscular fatigue and intermittent transient discomfort throughout, denies any increase in resting pain. No adverse events. Recommend continuing along current POC in order to address relevant deficits and improve functional tolerance. Pt departs today's session in no acute distress, all voiced questions/concerns addressed appropriately from PT perspective.    Per eval - Patient is a pleasant 27 y.o. woman who was seen today for physical therapy evaluation and treatment for cervical radiculopathy, recently discharged from our clinic after undergoing treatment for L shoulder issues. Pt describes worsening of symptoms during time away, now having occasional BIL UE numbness mostly with overhead activities and when lying supine. Red flag questioning/screening reassuring as above. Continues to describe difficulty with household tasks and childcare. On exam, pt  demonstrates limitations in cervical mobility, L GH mobility. Quite tender to palpation R rhomboid, middle trap, and infraspinatus. Negative spurling's test and no affect on UE symptoms with gentle cervical distraction. Does demonstrate poor postural tolerance to upright/extension and lying supine. Tolerates HEP well overall although does require some cues/assist with UE positioning during scapular retractions due to discomfort in anterior shoulder initially. No adverse events, symptoms are fairly irritable throughout but mitigated by rest breaks, pt denies any increase in symptoms on departure. Recommend trial of skilled PT to address aforementioned deficits with aim of improving functional tolerance and reducing pain with typical activities. Pt departs today's session in no acute distress, all voiced concerns/questions addressed appropriately from PT perspective.    OBJECTIVE IMPAIRMENTS: decreased activity tolerance, decreased endurance, decreased mobility, decreased ROM, decreased strength, hypomobility, impaired perceived functional ability, impaired UE functional use, postural dysfunction, and pain.   ACTIVITY LIMITATIONS: carrying, lifting, reach over head, hygiene/grooming, and caring for others  PARTICIPATION LIMITATIONS: meal prep, cleaning, laundry, driving, community activity, and occupation  PERSONAL FACTORS: Time since onset of injury/illness/exacerbation are also affecting patient's functional outcome.   REHAB POTENTIAL: Good  CLINICAL DECISION MAKING: Evolving/moderate complexity  EVALUATION COMPLEXITY: Moderate   GOALS: Goals reviewed with patient? Yes  SHORT TERM GOALS: Target date: 10/25/2023 Pt will demonstrate appropriate understanding and performance of initially prescribed HEP in order to facilitate improved independence with management of symptoms.  Baseline: HEP provided on eval Goal status: INITIAL   2. Pt will score greater than or equal to 58 on FOTO in order to  demonstrate improved perception of function due to symptoms.  Baseline: 54  Goal status: INITIAL    LONG TERM GOALS: Target date: 11/15/2023  Pt will score 63 on FOTO in order to demonstrate improved perception of function due to symptoms. Baseline: 54 Goal status: INITIAL  2. Pt will demonstrate at least 50 degrees of active cervical rotation ROM bilaterallyin order to demonstrate improved environmental awareness and safety with driving.  Baseline: see ROM chart above Goal status: INITIAL  3. Pt will demonstrate at least 140 degrees of L shoulder flexion AROM for improved symmetry of UE strength and improved tolerance to overhead reaching. Baseline: see ROM chart above Goal status: INITIAL   4. Pt will demonstrate appropriate performance of final prescribed HEP in order to facilitate improved self-management of symptoms post-discharge.   Baseline: initial HEP prescribed  Goal status: INITIAL    5. Pt will report at least 50% decrease in overall  pain levels in past week in order to facilitate improved tolerance to basic ADLs/mobility.   Baseline: 2-10+/10  Goal status: INITIAL       PLAN:  PT FREQUENCY: 1-2x/week   PT DURATION: 6 weeks    PLANNED INTERVENTIONS: Therapeutic exercises, Therapeutic activity, Neuromuscular re-education, Balance training, Gait training, Patient/Family education, Self Care, Joint mobilization, Joint manipulation, Aquatic Therapy, Dry Needling, Electrical stimulation, Spinal manipulation, Spinal mobilization, Cryotherapy, Moist heat, Taping, Manual therapy, and Re-evaluation  PLAN FOR NEXT SESSION: Review/update HEP PRN. Work into postural extension as able/appropriate, Metallurgist. Symptom modification strategies as indicated/appropriate.     Ashley Murrain PT, DPT 10/25/2023 12:07 PM    Check all possible CPT codes: 01601 - PT Re-evaluation, 97110- Therapeutic Exercise, [phone]- Neuro Re-education, [phone] - Gait  Training, [phone] - Manual Therapy, [phone] - Therapeutic Activities, [phone] - Self Care, [phone] - Physical performance training, and [phone] - Aquatic therapy    Check all conditions that are expected to impact treatment: {Conditions expected to impact treatment:Musculoskeletal disorders   If treatment provided at initial evaluation, no treatment charged due to lack of authorization.

## 2023-10-25 ENCOUNTER — Ambulatory Visit: Payer: Medicaid Other | Admitting: Physical Therapy

## 2023-10-25 ENCOUNTER — Encounter: Payer: Self-pay | Admitting: Physical Therapy

## 2023-10-25 DIAGNOSIS — M542 Cervicalgia: Secondary | ICD-10-CM | POA: Diagnosis not present

## 2023-10-25 DIAGNOSIS — M25512 Pain in left shoulder: Secondary | ICD-10-CM

## 2023-10-25 DIAGNOSIS — M25612 Stiffness of left shoulder, not elsewhere classified: Secondary | ICD-10-CM

## 2023-10-25 DIAGNOSIS — M6281 Muscle weakness (generalized): Secondary | ICD-10-CM

## 2023-10-29 NOTE — Therapy (Signed)
OUTPATIENT PHYSICAL THERAPY TREATMENT NOTE   Patient Name: Nicole Harris MRN: [id number] DOB:February 27, 1996, 27 y.o., female Today's Date: 10/30/2023  END OF SESSION:  PT End of Session - 10/30/23 1024     Visit Number 3    Number of Visits 13    Date for PT Re-Evaluation 11/15/23    Authorization Type [employer] (11 visits used previously)    Authorization Time Period 7 visits 10/07/23-12/05/23    Authorization - Visit Number 2    Authorization - Number of Visits 7    PT Start Time 1025   late check in   PT Stop Time 1100    PT Time Calculation (min) 35 min    Activity Tolerance Patient tolerated treatment well;No increased pain;Patient limited by pain    Behavior During Therapy [facility] for tasks assessed/performed               Past Medical History:  Diagnosis Date   ADHD (attention deficit hyperactivity disorder)    Anemia    Anxiety    Bipolar disorder (HCC)    Depression    Headache    Nausea/vomiting in pregnancy 07/10/2018   Stress incontinence in female 02/16/2021   Past Surgical History:  Procedure Laterality Date   ADENOIDECTOMY     TONGUE SURGERY     TONSILLECTOMY     Patient Active Problem List   Diagnosis Date Noted   Anemia of mother in pregnancy, postpartum condition 10/14/2022   [redacted] weeks gestation of pregnancy 10/13/2022   Sciatica of right side 07/23/2022   Low grade squamous intraepithelial lesion (LGSIL) on cervical Pap smear 05/09/2022   Regularly irregular pulse rhythym 04/28/2022   Supervision of other normal pregnancy, antepartum 04/16/2022   Vaginal delivery 02/07/2021   History of depression 10/19/2020   Hx of delivery by vacuum extraction, currently pregnant 09/14/2020   Nausea and vomiting in pregnancy 07/10/2018    PCP: no PCP in chart  REFERRING PROVIDER: Margart Sickles, PA-C  REFERRING DIAG: LT UE CERVICAL RADICULOPATHY  THERAPY DIAG:  Cervicalgia  Left shoulder pain, unspecified chronicity  Muscle weakness  (generalized)  Rationale for Evaluation and Treatment: Rehabilitation  ONSET DATE: 2021  SUBJECTIVE:                                                                                                                                                                                                        Per eval - Pt arrives w/ referral for cervical radiculopathy after following up with provider, recent discharge from PT with our clinic in July 2024.  She states she was unable to get MRI due to insurance issues but did have an XR done which she states showed degenerative changes. Pt notes that in time away from PT pain seems to be worsening although reports overall quality of symptoms seems about the same - continued difficulty with lifting, reaching. Difficulty sleeping due to discomfort in supine which prompts B hand numbness. She states she is still doing HEP from prior bout of PT. States she seems to be having a bit more RUE symptoms than before, noticing pain is sometimes crossing between shoulders posteriorly.  She denies any new headaches, no difficulty with swallowing/speech, no visual changes.  Hand dominance: Left  SUBJECTIVE STATEMENT: 10/30/2023 still having pain/numbness with reaching overhead but has been modifying activities around the home which has been helpful. Felt mild soreness/fatigue after last session but no extra pain or numbness. HEP going okay   PERTINENT HISTORY:  R sided sciatica, L RC issues  PAIN:  Are you having pain: 3/10 pain BIL shoulders no distal symptoms  Per eval -  Location/description: L neck/shoulder, aching; occasionally shooting into UE, L>R Best-worst over past week: 2-10+/10  - aggravating factors: sleeping (lying supine), reaching, more extension based posture, lifting - Easing factors: cessation of provocative activities     PRECAUTIONS: None    WEIGHT BEARING RESTRICTIONS: No  FALLS:  Has patient fallen in last 6 months? 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  PLOF: Independent  PATIENT GOALS: reduce pain, be able to do more with childcare  NEXT MD VISIT: TBD per pt report  OBJECTIVE: (objective measures completed at initial evaluation unless otherwise dated)   DIAGNOSTIC FINDINGS:  Unable to view in chart During recent episode of care, pt endorsed MRI showing rotator cuff tendinitis On evaluation for this episode of care: Pt reports cervical XR showing some degeneration, no other issues that she recalls   PATIENT SURVEYS:  FOTO 54 current, 63 predicted  COGNITION: Overall cognitive status: Within functional limits for tasks assessed  SENSATION/NEURO: Light touch intact BIL UE Finger<>nose testing unremarkable either UE  No clonus either LE Negative hoffmann and tromner sign BIL Dysdiadochokinesia screening unremarkable No ataxia with gait  POSTURE: forward head, rounded shoulders BIL, guarded UE posture  PALPATION: Concordant tenderness R middle trap, infraspinatus, rhomboid, thoracic paraspinals;   CERVICAL ROM:   Active ROM A/PROM (deg) eval  Flexion 100%  Extension 100# mild shoulder pain  Right lateral flexion 75% s  Left lateral flexion 50% *  Right rotation 55 deg  Left rotation 39 deg   (Blank rows = not tested) (Key: WFL = within functional limits not formally assessed, * = concordant pain, s = stiffness/stretching sensation, NT = not tested) Comment:   UPPER EXTREMITY ROM:  A/PROM Right eval Left eval  Shoulder flexion 146 deg 115 deg *  Shoulder abduction    Shoulder internal rotation (functional combo)    Shoulder external rotation (functional combo)    Elbow flexion    Elbow extension    Wrist flexion    Wrist extension     (Blank rows = not tested) (Key: WFL = within functional limits not formally assessed, * = concordant pain, s = stiffness/stretching sensation, NT  = not tested)  Comments:    UPPER EXTREMITY MMT:  MMT Right eval Left eval  Shoulder flexion    Shoulder extension 4+ 4 anterior shoulder pain  Shoulder abduction    Shoulder extension    Shoulder internal rotation 5 5*  Shoulder external rotation 4+ 4+ *  Elbow flexion    Elbow extension    Grip strength 75# 75#  (Blank rows = not tested)  (Key: WFL = within functional limits not formally assessed, * = concordant pain, s = stiffness/stretching sensation, NT = not tested)  Comments:   CERVICAL SPECIAL TESTS:  Negative spurlings, does provoke some transient neck discomfort/tightness   FUNCTIONAL TESTS:  Overhead reach: limited BIL L>R   TODAY'S TREATMENT:                                                                                                                              [facility] Adult PT Treatment:                                                DATE: 10/30/23 Therapeutic Exercise: Swiss ball up wall x8 Swiss ball press down 2x5 cues for posture and breath control Standing chin tuck ball at wall 3x5 cues for positioning and reduced compensations  Double ER + scapular retraction red band with foam roll at back/head x10 BIL scaption red band around wrists, short arc x8 cues for comfortable ROM and reduced shrugging    [facility] Adult PT Treatment:                                                DATE: 10/25/23 Therapeutic Exercise: Supine double ER 2x12 cues for setup red band  Supine horizontal abduction red band 2x8 Red band around wrist BIL scaption min ROM (<90) 2x8 cues for posture and reduced shrugging Seated chin tuck x10 cues for reduced compensation Supine chin tuck x10 cues for form Swiss ball flexion ball up wall 2x5 cues for posture HEP update + education/handout  Manual Therapy: Prone; gentle STM, open palm technique to lower trap, thoracic paraspinals, infraspinatus/teres    [facility] Adult PT Treatment:                                                DATE:  10/04/23 Therapeutic Exercise: Cervical retraction x8 cues for reduced compensations, posture Scapular retraction x8 cues for arm positioning to improve comfort HEP handout + education   PATIENT EDUCATION:  Education details: rationale for interventions, HEP Person educated: Patient Education method: Explanation, Demonstration, Tactile cues, Verbal cues Education comprehension: verbalized understanding, returned demonstration, verbal cues required, tactile cues required, and needs further education    HOME EXERCISE PROGRAM: Access Code: [email or web address] URL: [email or web address] Date: 10/25/2023  Prepared by: Fransisco Hertz  Exercises - Seated Scapular Retraction  - 2-3 x daily - 7 x weekly - 1 sets - 8 reps - Seated Cervical Retraction  - 2-3 x daily - 7 x weekly - 1 sets - 8 reps - Supine Bilateral Shoulder External Rotation with Resistance around Wrists  - 2-3 x daily - 7 x weekly - 1 sets - 8 reps - Supine Shoulder Horizontal Abduction with Resistance  - 2-3 x daily - 7 x weekly - 1 sets - 8 reps  ASSESSMENT:  CLINICAL IMPRESSION: 10/30/2023 Pt arrives w/ 3/10 BIL shoulder pain, no issues after last session. Today continues to fatigue quickly with progressions and intermittent symptom irritability, cues for comfortable ROM and mechanics.  departs with report of mild increase in shoulder blade pain, no adverse events. Recommend continuing along current POC in order to address relevant deficits and improve functional tolerance. Pt departs today's session in no acute distress, all voiced questions/concerns addressed appropriately from PT perspective.     Per eval - Patient is a pleasant 27 y.o. woman who was seen today for physical therapy evaluation and treatment for cervical radiculopathy, recently discharged from our clinic after undergoing treatment for L shoulder issues. Pt describes worsening of symptoms during time away, now having occasional BIL UE numbness mostly with  overhead activities and when lying supine. Red flag questioning/screening reassuring as above. Continues to describe difficulty with household tasks and childcare. On exam, pt demonstrates limitations in cervical mobility, L GH mobility. Quite tender to palpation R rhomboid, middle trap, and infraspinatus. Negative spurling's test and no affect on UE symptoms with gentle cervical distraction. Does demonstrate poor postural tolerance to upright/extension and lying supine. Tolerates HEP well overall although does require some cues/assist with UE positioning during scapular retractions due to discomfort in anterior shoulder initially. No adverse events, symptoms are fairly irritable throughout but mitigated by rest breaks, pt denies any increase in symptoms on departure. Recommend trial of skilled PT to address aforementioned deficits with aim of improving functional tolerance and reducing pain with typical activities. Pt departs today's session in no acute distress, all voiced concerns/questions addressed appropriately from PT perspective.    OBJECTIVE IMPAIRMENTS: decreased activity tolerance, decreased endurance, decreased mobility, decreased ROM, decreased strength, hypomobility, impaired perceived functional ability, impaired UE functional use, postural dysfunction, and pain.   ACTIVITY LIMITATIONS: carrying, lifting, reach over head, hygiene/grooming, and caring for others  PARTICIPATION LIMITATIONS: meal prep, cleaning, laundry, driving, community activity, and occupation  PERSONAL FACTORS: Time since onset of injury/illness/exacerbation are also affecting patient's functional outcome.   REHAB POTENTIAL: Good  CLINICAL DECISION MAKING: Evolving/moderate complexity  EVALUATION COMPLEXITY: Moderate   GOALS: Goals reviewed with patient? Yes  SHORT TERM GOALS: Target date: 10/25/2023 Pt will demonstrate appropriate understanding and performance of initially prescribed HEP in order to facilitate  improved independence with management of symptoms.  Baseline: HEP provided on eval 10/30/23: pt reports good HEP adherence Goal status: MET  2. Pt will score greater than or equal to 58 on FOTO in order to demonstrate improved perception of function due to symptoms.  Baseline: 54  10/30/23: deferred given visit three  Goal status: ONGOING  LONG TERM GOALS: Target date: 11/15/2023  Pt will score 63 on FOTO in order to demonstrate improved perception of function due to symptoms. Baseline: 54 Goal status: INITIAL  2. Pt will demonstrate at least 50 degrees of active cervical rotation ROM bilaterallyin order to demonstrate improved environmental awareness and safety with driving.  Baseline: see ROM chart above Goal status: INITIAL  3. Pt will demonstrate at least 140 degrees of L shoulder flexion AROM for improved symmetry of UE strength and improved tolerance to overhead reaching. Baseline: see ROM chart above Goal status: INITIAL   4. Pt will demonstrate appropriate performance of final prescribed HEP in order to facilitate improved self-management of symptoms post-discharge.   Baseline: initial HEP prescribed  Goal status: INITIAL    5. Pt will report at least 50% decrease in overall pain levels in past week in order to facilitate improved tolerance to basic ADLs/mobility.   Baseline: 2-10+/10  Goal status: INITIAL       PLAN:  PT FREQUENCY: 1-2x/week   PT DURATION: 6 weeks    PLANNED INTERVENTIONS: Therapeutic exercises, Therapeutic activity, Neuromuscular re-education, Balance training, Gait training, Patient/Family education, Self Care, Joint mobilization, Joint manipulation, Aquatic Therapy, Dry Needling, Electrical stimulation, Spinal manipulation, Spinal mobilization, Cryotherapy, Moist heat, Taping, Manual therapy, and Re-evaluation  PLAN FOR NEXT SESSION: Review/update HEP PRN. Work into postural extension as able/appropriate, Metallurgist.  Symptom modification strategies as indicated/appropriate.     Ashley Murrain PT, DPT 10/30/2023 12:36 PM    Check all possible CPT codes: 32440 - PT Re-evaluation, 97110- Therapeutic Exercise, [phone]- Neuro Re-education, [phone] - Gait Training, [phone] - Manual Therapy, [phone] - Therapeutic Activities, [phone] - Self Care, [phone] - Physical performance training, and [phone] - Aquatic therapy    Check all conditions that are expected to impact treatment: {Conditions expected to impact treatment:Musculoskeletal disorders   If treatment provided at initial evaluation, no treatment charged due to lack of authorization.

## 2023-10-30 ENCOUNTER — Ambulatory Visit: Payer: Medicaid Other | Admitting: Physical Therapy

## 2023-10-30 ENCOUNTER — Encounter: Payer: Self-pay | Admitting: Physical Therapy

## 2023-10-30 DIAGNOSIS — M542 Cervicalgia: Secondary | ICD-10-CM

## 2023-10-30 DIAGNOSIS — M25512 Pain in left shoulder: Secondary | ICD-10-CM

## 2023-10-30 DIAGNOSIS — M6281 Muscle weakness (generalized): Secondary | ICD-10-CM

## 2023-11-01 ENCOUNTER — Encounter: Payer: Self-pay | Admitting: Physical Therapy

## 2023-11-01 ENCOUNTER — Ambulatory Visit: Payer: Medicaid Other | Attending: Physician Assistant | Admitting: Physical Therapy

## 2023-11-01 DIAGNOSIS — M25512 Pain in left shoulder: Secondary | ICD-10-CM

## 2023-11-01 DIAGNOSIS — M6281 Muscle weakness (generalized): Secondary | ICD-10-CM | POA: Diagnosis present

## 2023-11-01 DIAGNOSIS — M542 Cervicalgia: Secondary | ICD-10-CM

## 2023-11-01 NOTE — Therapy (Addendum)
OUTPATIENT PHYSICAL THERAPY TREATMENT NOTE + NO VISIT DISCHARGE SUMMARY (see below)    Patient Name: Nicole Harris MRN: [id number] DOB:04/06/1996, 27 y.o., female Today's Date: 11/01/2023  END OF SESSION:  PT End of Session - 11/01/23 1017     Visit Number 4    Number of Visits 13    Date for PT Re-Evaluation 11/15/23    Authorization Type healthy blue (11 visits used previously)    Authorization Time Period 7 visits 10/07/23-12/05/23    Authorization - Visit Number 3    Authorization - Number of Visits 7    PT Start Time 1017    PT Stop Time 1058    PT Time Calculation (min) 41 min    Activity Tolerance Patient tolerated treatment well    Behavior During Therapy WFL for tasks assessed/performed                Past Medical History:  Diagnosis Date   ADHD (attention deficit hyperactivity disorder)    Anemia    Anxiety    Bipolar disorder (HCC)    Depression    Headache    Nausea/vomiting in pregnancy 07/10/2018   Stress incontinence in female 02/16/2021   Past Surgical History:  Procedure Laterality Date   ADENOIDECTOMY     TONGUE SURGERY     TONSILLECTOMY     Patient Active Problem List   Diagnosis Date Noted   Anemia of mother in pregnancy, postpartum condition 10/14/2022   [redacted] weeks gestation of pregnancy 10/13/2022   Sciatica of right side 07/23/2022   Low grade squamous intraepithelial lesion (LGSIL) on cervical Pap smear 05/09/2022   Regularly irregular pulse rhythym 04/28/2022   Supervision of other normal pregnancy, antepartum 04/16/2022   Vaginal delivery 02/07/2021   History of depression 10/19/2020   Hx of delivery by vacuum extraction, currently pregnant 09/14/2020   Nausea and vomiting in pregnancy 07/10/2018    PCP: no PCP in chart  REFERRING PROVIDER: Margart Sickles, PA-C  REFERRING DIAG: LT UE CERVICAL RADICULOPATHY  THERAPY DIAG:  Cervicalgia  Left shoulder pain, unspecified chronicity  Muscle weakness  (generalized)  Rationale for Evaluation and Treatment: Rehabilitation  ONSET DATE: 2021  SUBJECTIVE:                                                                                                                                                                                                        Per eval - Pt arrives w/ referral for cervical radiculopathy after following up with provider, recent discharge from PT with our clinic in July 2024.  She states she was unable to get MRI due to insurance issues but did have an XR done which she states showed degenerative changes. Pt notes that in time away from PT pain seems to be worsening although reports overall quality of symptoms seems about the same - continued difficulty with lifting, reaching. Difficulty sleeping due to discomfort in supine which prompts B hand numbness. She states she is still doing HEP from prior bout of PT. States she seems to be having a bit more RUE symptoms than before, noticing pain is sometimes crossing between shoulders posteriorly.  She denies any new headaches, no difficulty with swallowing/speech, no visual changes.  Hand dominance: Left  SUBJECTIVE STATEMENT: 11/01/2023 fatigue from Halloween activities last night but overall doing well. Felt sore for a couple hours after last session and a bit of neck pain but resolved afterwards. HEP going well    PERTINENT HISTORY:  R sided sciatica, L RC issues  PAIN:  Are you having pain: none at present  Per eval -  Location/description: L neck/shoulder, aching; occasionally shooting into UE, L>R Best-worst over past week: 2-10+/10  - aggravating factors: sleeping (lying supine), reaching, more extension based posture, lifting - Easing factors: cessation of provocative activities     PRECAUTIONS: None    WEIGHT BEARING RESTRICTIONS: No  FALLS:  Has patient fallen in last 6 months? 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  PLOF: Independent  PATIENT GOALS: reduce pain, be able to do more with childcare  NEXT MD VISIT: TBD per pt report  OBJECTIVE: (objective measures completed at initial evaluation unless otherwise dated)   DIAGNOSTIC FINDINGS:  Unable to view in chart During recent episode of care, pt endorsed MRI showing rotator cuff tendinitis On evaluation for this episode of care: Pt reports cervical XR showing some degeneration, no other issues that she recalls   PATIENT SURVEYS:  FOTO 54 current, 63 predicted  COGNITION: Overall cognitive status: Within functional limits for tasks assessed  SENSATION/NEURO: Light touch intact BIL UE Finger<>nose testing unremarkable either UE  No clonus either LE Negative hoffmann and tromner sign BIL Dysdiadochokinesia screening unremarkable No ataxia with gait  POSTURE: forward head, rounded shoulders BIL, guarded UE posture  PALPATION: Concordant tenderness R middle trap, infraspinatus, rhomboid, thoracic paraspinals;   CERVICAL ROM:   Active ROM A/PROM (deg) eval AROM 11/01/23  Flexion 100%   Extension 100# mild shoulder pain   Right lateral flexion 75% s   Left lateral flexion 50% *   Right rotation 55 deg 58 deg  Left rotation 39 deg 48 deg    (Blank rows = not tested) (Key: WFL = within functional limits not formally assessed, * = concordant pain, s = stiffness/stretching sensation, NT = not tested) Comment:   UPPER EXTREMITY ROM:  A/PROM Right eval Left eval  Shoulder flexion 146 deg 115 deg *  Shoulder abduction    Shoulder internal rotation (functional combo)    Shoulder external rotation (functional combo)    Elbow flexion    Elbow extension    Wrist flexion    Wrist extension     (Blank rows = not tested) (Key: WFL = within functional limits not formally assessed, * = concordant pain, s = stiffness/stretching sensation, NT = not tested)  Comments:    UPPER  EXTREMITY MMT:  MMT Right eval Left eval  Shoulder flexion    Shoulder  extension 4+ 4 anterior shoulder pain  Shoulder abduction    Shoulder extension    Shoulder internal rotation 5 5*  Shoulder external rotation 4+ 4+ *  Elbow flexion    Elbow extension    Grip strength 75# 75#  (Blank rows = not tested)  (Key: WFL = within functional limits not formally assessed, * = concordant pain, s = stiffness/stretching sensation, NT = not tested)  Comments:   CERVICAL SPECIAL TESTS:  Negative spurlings, does provoke some transient neck discomfort/tightness   FUNCTIONAL TESTS:  Overhead reach: limited BIL L>R   TODAY'S TREATMENT:                                                                                                                              [facility] Adult PT Treatment:                                                DATE: 11/01/23 Therapeutic Exercise: Supine green band double ER + scapular retraction x8 Swiss ball press down, seated, x8 cues for posture, breath control, and comfortable contraction Supine shoulder flexion AAROM w/ dowel, 2x5 cue for comfortable ROM Standing chin tuck ball at wall 2x8 Seated fwd chest press 2x5 2# emphasis on postural extension Red band row 2x8 cues for posture and reduced elbow compensations HEP update + education/handout    [facility] Adult PT Treatment:                                                DATE: 10/30/23 Therapeutic Exercise: Swiss ball up wall x8 Swiss ball press down 2x5 cues for posture and breath control Standing chin tuck ball at wall 3x5 cues for positioning and reduced compensations  Double ER + scapular retraction red band with foam roll at back/head x10 BIL scaption red band around wrists, short arc x8 cues for comfortable ROM and reduced shrugging    PATIENT EDUCATION:  Education details: rationale for interventions, HEP Person educated: Patient Education method: Explanation, Demonstration, Tactile cues, Verbal  cues Education comprehension: verbalized understanding, returned demonstration, verbal cues required, tactile cues required, and needs further education    HOME EXERCISE PROGRAM: Access Code: [email or web address] URL: [email or web address] Date: 11/01/2023 Prepared by: Fransisco Hertz  Exercises - Seated Scapular Retraction  - 2-3 x daily - 7 x weekly - 1 sets - 8 reps - Supine Bilateral Shoulder External Rotation with Resistance around Wrists  - 2-3 x daily - 7 x weekly - 1 sets - 8 reps - Supine Shoulder Horizontal Abduction with Resistance  - 2-3 x daily - 7 x weekly - 1 sets - 8 reps - Standing Isometric Cervical Retraction with Chin Tucks and [employer]  at Wall  - 2-3 x daily - 7 x weekly - 1 sets - 8 reps  ASSESSMENT:  CLINICAL IMPRESSION: 11/01/2023 Pt arrives w/o pain, notes some increased pain/soreness for a couple hours after last session but improved after. Today continuing to try and progress into more postural extension, transient/intermittent symptom irritability with activity - rest breaks to mitigate although overall pt notes improved tolerance today compared to last session. Reports muscular fatigue on departure through mid back but denies overt increase in pain. HEP update as above - no additional visits scheduled at this time, pt states she is waiting to hear back about her spouses new work schedule and will give [place] a call next week to schedule. Recommend continuing along current POC in order to address relevant deficits and improve functional tolerance. Pt departs today's session in no acute distress, all voiced questions/concerns addressed appropriately from PT perspective.      Per eval - Patient is a pleasant 27 y.o. woman who was seen today for physical therapy evaluation and treatment for cervical radiculopathy, recently discharged from our clinic after undergoing treatment for L shoulder issues. Pt describes worsening of symptoms during time away, now having occasional BIL UE  numbness mostly with overhead activities and when lying supine. Red flag questioning/screening reassuring as above. Continues to describe difficulty with household tasks and childcare. On exam, pt demonstrates limitations in cervical mobility, L GH mobility. Quite tender to palpation R rhomboid, middle trap, and infraspinatus. Negative spurling's test and no affect on UE symptoms with gentle cervical distraction. Does demonstrate poor postural tolerance to upright/extension and lying supine. Tolerates HEP well overall although does require some cues/assist with UE positioning during scapular retractions due to discomfort in anterior shoulder initially. No adverse events, symptoms are fairly irritable throughout but mitigated by rest breaks, pt denies any increase in symptoms on departure. Recommend trial of skilled PT to address aforementioned deficits with aim of improving functional tolerance and reducing pain with typical activities. Pt departs today's session in no acute distress, all voiced concerns/questions addressed appropriately from PT perspective.    OBJECTIVE IMPAIRMENTS: decreased activity tolerance, decreased endurance, decreased mobility, decreased ROM, decreased strength, hypomobility, impaired perceived functional ability, impaired UE functional use, postural dysfunction, and pain.   ACTIVITY LIMITATIONS: carrying, lifting, reach over head, hygiene/grooming, and caring for others  PARTICIPATION LIMITATIONS: meal prep, cleaning, laundry, driving, community activity, and occupation  PERSONAL FACTORS: Time since onset of injury/illness/exacerbation are also affecting patient's functional outcome.   REHAB POTENTIAL: Good  CLINICAL DECISION MAKING: Evolving/moderate complexity  EVALUATION COMPLEXITY: Moderate   GOALS: Goals reviewed with patient? Yes  SHORT TERM GOALS: Target date: 10/25/2023 Pt will demonstrate appropriate understanding and performance of initially prescribed HEP in  order to facilitate improved independence with management of symptoms.  Baseline: HEP provided on eval 10/30/23: pt reports good HEP adherence Goal status: MET  2. Pt will score greater than or equal to 58 on FOTO in order to demonstrate improved perception of function due to symptoms.  Baseline: 54  10/30/23: deferred given visit three  Goal status: ONGOING  LONG TERM GOALS: Target date: 11/15/2023  Pt will score 63 on FOTO in order to demonstrate improved perception of function due to symptoms. Baseline: 54 Goal status: INITIAL  2. Pt will demonstrate at least 50 degrees of active cervical rotation ROM bilaterallyin order to demonstrate improved environmental awareness and safety with driving.  Baseline: see ROM chart above Goal status: INITIAL  3. Pt will demonstrate at least 140  degrees of L shoulder flexion AROM for improved symmetry of UE strength and improved tolerance to overhead reaching. Baseline: see ROM chart above Goal status: INITIAL   4. Pt will demonstrate appropriate performance of final prescribed HEP in order to facilitate improved self-management of symptoms post-discharge.   Baseline: initial HEP prescribed  Goal status: INITIAL    5. Pt will report at least 50% decrease in overall pain levels in past week in order to facilitate improved tolerance to basic ADLs/mobility.   Baseline: 2-10+/10  Goal status: INITIAL       PLAN:  PT FREQUENCY: 1-2x/week   PT DURATION: 6 weeks    PLANNED INTERVENTIONS: Therapeutic exercises, Therapeutic activity, Neuromuscular re-education, Balance training, Gait training, Patient/Family education, Self Care, Joint mobilization, Joint manipulation, Aquatic Therapy, Dry Needling, Electrical stimulation, Spinal manipulation, Spinal mobilization, Cryotherapy, Moist heat, Taping, Manual therapy, and Re-evaluation  PLAN FOR NEXT SESSION: Review/update HEP PRN. Work into postural extension as able/appropriate, Administrator. Symptom modification strategies as indicated/appropriate.     Ashley Murrain PT, DPT 11/01/2023 11:03 AM    Check all possible CPT codes: 16109 - PT Re-evaluation, 97110- Therapeutic Exercise, [phone]- Neuro Re-education, [phone] - Gait Training, [phone] - Manual Therapy, [phone] - Therapeutic Activities, [phone] - Self Care, [phone] - Physical performance training, and [phone] - Aquatic therapy    Check all conditions that are expected to impact treatment: {Conditions expected to impact treatment:Musculoskeletal disorders   If treatment provided at initial evaluation, no treatment charged due to lack of authorization.         Discharge addendum 12/19/2023  PHYSICAL THERAPY DISCHARGE SUMMARY  Visits from Start of Care: 4  Current functional level related to goals / functional outcomes: Unable to be assessed   Remaining deficits: Unable to be assessed   Education / Equipment: Unable to be assessed  Patient goals were unable to be assessed. Patient is being discharged due to not returning since the last visit.    Ashley Murrain PT, DPT 12/19/2023 10:02 AM

## 2023-11-19 ENCOUNTER — Other Ambulatory Visit: Payer: Self-pay

## 2023-11-19 ENCOUNTER — Encounter: Payer: Self-pay | Admitting: *Deleted

## 2023-11-19 ENCOUNTER — Ambulatory Visit
Admission: EM | Admit: 2023-11-19 | Discharge: 2023-11-19 | Disposition: A | Discharge status: Home / Self Care | Payer: Medicaid Other | Attending: Physician Assistant | Admitting: Physician Assistant

## 2023-11-19 DIAGNOSIS — R1031 Right lower quadrant pain: Secondary | ICD-10-CM | POA: Insufficient documentation

## 2023-11-19 DIAGNOSIS — R1032 Left lower quadrant pain: Secondary | ICD-10-CM | POA: Diagnosis not present

## 2023-11-19 DIAGNOSIS — N898 Other specified noninflammatory disorders of vagina: Secondary | ICD-10-CM | POA: Diagnosis not present

## 2023-11-19 LAB — POCT URINE PREGNANCY: Preg Test, Ur: NEGATIVE

## 2023-11-19 LAB — POCT URINALYSIS DIP (MANUAL ENTRY)
Bilirubin, UA: NEGATIVE
Blood, UA: NEGATIVE
Glucose, UA: NEGATIVE mg/dL
Leukocytes, UA: NEGATIVE
Nitrite, UA: NEGATIVE
Spec Grav, UA: >=1.03 — AB (ref 1.010–1.025)
Urobilinogen, UA: 0.2 U/dL
pH, UA: 6.5 (ref 5.0–8.0)

## 2023-11-19 NOTE — ED Triage Notes (Signed)
Pt reports lower abd pain and some discomfort with urination "irritation" x 3 days.Also reports lower abd pain  "tender to touch" x 3 days. Yellow vaginal d/c x 5-6 days.

## 2023-11-20 ENCOUNTER — Other Ambulatory Visit: Payer: Self-pay

## 2023-11-20 ENCOUNTER — Emergency Department (HOSPITAL_COMMUNITY)
Admission: EM | Admit: 2023-11-20 | Discharge: 2023-11-20 | Discharge status: Against Medical Advice | Payer: Medicaid Other | Attending: Emergency Medicine | Admitting: Emergency Medicine

## 2023-11-20 ENCOUNTER — Encounter (HOSPITAL_COMMUNITY): Payer: Self-pay

## 2023-11-20 DIAGNOSIS — N898 Other specified noninflammatory disorders of vagina: Secondary | ICD-10-CM | POA: Diagnosis not present

## 2023-11-20 DIAGNOSIS — R1032 Left lower quadrant pain: Secondary | ICD-10-CM | POA: Diagnosis not present

## 2023-11-20 DIAGNOSIS — R1031 Right lower quadrant pain: Secondary | ICD-10-CM | POA: Diagnosis present

## 2023-11-20 DIAGNOSIS — Z5321 Procedure and treatment not carried out due to patient leaving prior to being seen by health care provider: Secondary | ICD-10-CM | POA: Insufficient documentation

## 2023-11-20 LAB — CERVICOVAGINAL ANCILLARY ONLY
Bacterial Vaginitis (gardnerella): NEGATIVE
Candida Glabrata: NEGATIVE
Candida Vaginitis: NEGATIVE
Chlamydia: NEGATIVE
Comment: NEGATIVE
Comment: NEGATIVE
Comment: NEGATIVE
Comment: NEGATIVE
Comment: NEGATIVE
Comment: NORMAL
Neisseria Gonorrhea: NEGATIVE
Trichomonas: NEGATIVE

## 2023-11-20 NOTE — ED Triage Notes (Addendum)
Pt c/o RLQ, LLQ abdominal pain. Pt sent by gyne to get pelvic US, because they told her they think there is a "gas problem" in her pelvic. Pt states went to UC and was tested for STI's yesterday. Pt states the pain gets worse when she eats. Pt states she also has heavy yellow vaginal discharge.

## 2023-11-20 NOTE — ED Notes (Signed)
Patient LWBS, despite being advised not to. Dragging OTF.

## 2023-11-21 ENCOUNTER — Emergency Department (HOSPITAL_BASED_OUTPATIENT_CLINIC_OR_DEPARTMENT_OTHER)
Admission: EM | Admit: 2023-11-21 | Discharge: 2023-11-21 | Disposition: A | Discharge status: Home / Self Care | Payer: Medicaid Other | Attending: Emergency Medicine | Admitting: Emergency Medicine

## 2023-11-21 DIAGNOSIS — Z91040 Latex allergy status: Secondary | ICD-10-CM | POA: Diagnosis not present

## 2023-11-21 DIAGNOSIS — N898 Other specified noninflammatory disorders of vagina: Secondary | ICD-10-CM | POA: Diagnosis present

## 2023-11-21 LAB — WET PREP, GENITAL
Clue Cells Wet Prep HPF POC: NONE SEEN
Sperm: NONE SEEN
Trich, Wet Prep: NONE SEEN
WBC, Wet Prep HPF POC: <10 (ref <10)
Yeast Wet Prep HPF POC: NONE SEEN

## 2023-11-21 LAB — URINE CULTURE: Culture: 50000 — AB

## 2023-11-21 NOTE — ED Notes (Signed)
Report given to the next RN.Marland KitchenMarland Kitchen

## 2023-11-21 NOTE — ED Provider Notes (Signed)
[place] EMERGENCY DEPARTMENT AT [facility] Provider Note   CSN: [id number] Arrival date & time: 11/21/23  1806     History  Chief Complaint  Patient presents with   Abnormal Lab   Abdominal Cramping    Nicole Harris is a 27 y.o. female with PMHx pelvic floor dysfunction, anxiety, bipolar disorder, depression, chronic headaches who presents to ED concerned for yellow vaginal discharge and mild pelvic cramping x1 week. Patient has been having moderate pelvic discomfort since the birth of her last child 09/2022 but denies severe dyspareunia that leads to cessation of intercourse. Patient not concerned for STI. Was tested earlier this week with self-swab that was negative. Patient here again today stating that she does not know what her test results were from earlier this week.  Denies fever, chest pain, dyspnea, cough, nausea, vomiting, diarrhea, dysuria, hematuria, hematochezia.     Abnormal Lab Abdominal Cramping       Home Medications Prior to Admission medications   Medication Sig Start Date End Date Taking? Authorizing Provider  acetaminophen (TYLENOL) 325 MG tablet Take 2 tablets (650 mg total) by mouth every 4 (four) hours as needed (for pain scale < 4). 10/15/22   Mercado-Ortiz, Lahoma Crocker, DO  amoxicillin-clavulanate (AUGMENTIN) 875-125 MG tablet Take 1 tablet by mouth 2 (two) times daily. Patient not taking: Reported on 05/29/2023 10/31/22   Brock Bad, MD  benzocaine-Menthol (DERMOPLAST) 20-0.5 % AERO Apply 1 Application topically as needed for irritation (perineal discomfort). 10/15/22   Mercado-Ortiz, Lahoma Crocker, DO  ferrous sulfate 325 (65 FE) MG tablet Take 325 mg by mouth every other day.    [provider]  ibuprofen (ADVIL) 800 MG tablet Take 1 tablet (800 mg total) by mouth every 8 (eight) hours as needed. 10/31/22   Brock Bad, MD  metroNIDAZOLE (FLAGYL) 500 MG tablet Take 1 tablet (500 mg total) by mouth 2 (two) times daily.  06/03/23   Brock Bad, MD  senna-docusate (SENOKOT-S) 8.6-50 MG tablet Take 2 tablets by mouth daily. 10/15/22   Mercado-Ortiz, Lahoma Crocker, DO  witch hazel-glycerin (TUCKS) pad Apply 1 Application topically as needed for hemorrhoids. 10/15/22   Mercado-Ortiz, Lahoma Crocker, DO      Allergies    Latex and Shellfish allergy    Review of Systems   Review of Systems  Genitourinary:  Positive for vaginal discharge.    Physical Exam Updated Vital Signs BP (!) 112/59 (BP Location: Right Arm)   Pulse 91   Temp 97.6 F (36.4 C)   Resp 16   Ht 5\' 4"  (1.626 m)   Wt 59 kg   SpO2 99%   Breastfeeding Yes   BMI 22.31 kg/m  Physical Exam Vitals and nursing note reviewed.  Constitutional:      General: She is not in acute distress.    Appearance: She is not ill-appearing or toxic-appearing.  HENT:     Head: Normocephalic and atraumatic.     Mouth/Throat:     Mouth: Mucous membranes are moist.  Eyes:     General: No scleral icterus.       Right eye: No discharge.        Left eye: No discharge.     Conjunctiva/sclera: Conjunctivae normal.  Cardiovascular:     Rate and Rhythm: Normal rate and regular rhythm.     Pulses: Normal pulses.     Heart sounds: Normal heart sounds. No murmur heard. Pulmonary:     Effort: Pulmonary effort is normal.  Abdominal:     General: Abdomen is flat. Bowel sounds are normal. There is no distension.     Palpations: Abdomen is soft. There is no mass.     Tenderness: There is no abdominal tenderness.  Genitourinary:    Comments: - External: without ulcers, lesions, or lacerations - Vaginal Vault: without laceration, pooled blood, clots, foreign body - Cervix: without discoloration, friability, or masses - Os: multiparous; closed; thick yellow discharge; no bleeding - Bimanual exam with mild cervical motion tenderness - patient stating that this is normal given pelvic floor dysfunction   Musculoskeletal:     Right lower leg: No edema.     Left lower  leg: No edema.  Skin:    General: Skin is warm and dry.     Findings: No rash.  Neurological:     General: No focal deficit present.     Mental Status: She is alert. Mental status is at baseline.  Psychiatric:        Mood and Affect: Mood normal.        Behavior: Behavior normal.     ED Results / Procedures / Treatments   Labs (all labs ordered are listed, but only abnormal results are displayed) Labs Reviewed  WET PREP, GENITAL  GC/CHLAMYDIA PROBE AMP ([place]) NOT AT [facility]    EKG None  Radiology No results found.  Procedures Procedures    Medications Ordered in ED Medications - No data to display  ED Course/ Medical Decision Making/ A&P                                 Medical Decision Making Amount and/or Complexity of Data Reviewed Labs: ordered.   This patient presents to the ED for concern of vaginal discharge, this involves an extensive number of treatment options, and is a complaint that carries with it a high risk of complications and morbidity.  The differential diagnosis includes   ruptured ovarian cyst, foreign body, STD, BV, yeast infection, hygiene, PID   Co morbidities that complicate the patient evaluation  Pelvic flood dysfunction   Additional history obtained:  OBGYN with [facility] Self swabs from earlier this week were negative   Lab Tests:  I Ordered, and personally interpreted labs.  The pertinent results include:  - Wet prep: negative - STI Testing: pending   Problem List / ED Course / Critical interventions / Medication management  Patient presents to ED concern for yellow vaginal discharge x 1 week.  Patient also stating that she has been having some mild pelvic cramping that almost feels like normal ovulation/menstrual cramping.  Patient concerned because she is not sure if her last self swabs resulted with STD or not.  Patient stating that she has not received a pelvic exam for this complaint.  Offered patient pelvic  exam today which she agreed.  Pelvic exam with some thick yellow discharge but is otherwise unremarkable.  Patient does have some mild cervical motion tenderness but states that this is normal for her ever since the birth of her child last year.  Wet prep negative for yeast, trichomoniasis, clue cells, white blood cells. Offered patient blood testing such as RPR and HIV.  Patient stating that she just got these labs recently and does not want them repeated again today.  Shared decision making with patient who stated that she wanted to withhold antibiotics at this time and follow-up with her GYN or Department  of Public Health if her GC/chlamydia swabs are positive.  Patient not concerned for STD stating that her husband is very trustworthy.  I shared with patient that she needs to have a low threshold to come back to the emergency room if she starts having severe abdominal pain, fever, vomiting.  I am less concerned for PID/tubo-ovarian abscess today given her physical exam, no fever, lack of severe pain, reassuring vital signs, and absence of WBC on her wet prep.  Today, it appears that her discharge is due to hormonal changes vs hygiene vs pelvic floor dysfunction.   I have reviewed the patients home medicines and have made adjustments as needed. Patient afebrile with stable vitals.  Provided with return precautions.  Discharged in good condition.    Social Determinants of Health:  none          Final Clinical Impression(s) / ED Diagnoses Final diagnoses:  Discharge from the vagina    Rx / DC Orders ED Discharge Orders     None         Dorthy Cooler, [place] 11/22/23 1154    Glyn Ade, MD 11/22/23 1455

## 2023-11-21 NOTE — ED Triage Notes (Signed)
Pt to ED c/o yellow vaginal discharge x 1 week, with abdominal cramping. Reports has been evaluated at [facility] and [facility] for the same. Yesterday and the day before. Concerned today because saw in mychart that urine culture was positive.

## 2023-11-21 NOTE — Discharge Instructions (Addendum)
It was a pleasure caring for you today.  Please follow-up with your GYN.  If your gonorrhea/chlamydia test result positive, you will need to follow-up with the Department of Public Health.  Their information is found in his discharge paperwork.  Seek emergency care if experiencing any new or worsening symptoms

## 2023-11-25 LAB — GC/CHLAMYDIA PROBE AMP (~~LOC~~) NOT AT ARMC
Chlamydia: NEGATIVE
Comment: NEGATIVE
Comment: NORMAL
Neisseria Gonorrhea: NEGATIVE

## 2023-11-27 ENCOUNTER — Telehealth: Payer: Self-pay

## 2023-11-27 DIAGNOSIS — R1031 Right lower quadrant pain: Secondary | ICD-10-CM

## 2023-11-27 NOTE — Telephone Encounter (Signed)
-----   Message from Tomi Bamberger sent at 11/27/2023  2:34 PM EST -----  Please have patient return for recollection. ----- Message ----- From: Interface, Lab In Three Zero One Sent: 11/20/2023   1:21 PM EST To: Tomi Bamberger, PA-C

## 2023-11-27 NOTE — Telephone Encounter (Signed)
MyChart message sent to patient to return for repeat Urine Collection. Unable to reach by phone (no answer).  B. Roten CMA

## 2023-11-27 NOTE — Progress Notes (Signed)
Unsuccessful in reaching patient. MyChart message sent. Please retry in PM.

## 2023-11-30 NOTE — ED Provider Notes (Signed)
CSN: [id number]     Arrival date & time 11/19/23  1722 History   None    Chief Complaint  Patient presents with   Vaginal Discharge   (Consider location/radiation/quality/duration/timing/severity/associated sxs/prior Treatment) Patient here today for evaluation of lower abdominal pain and dysuria.  Patient started having symptoms about 3 days ago.  She reports some yellow vaginal discharge as well.  She denies any fever.  The history is provided by the patient.  Vaginal Discharge Associated symptoms: dysuria   Associated symptoms: no abdominal pain, no fever, no nausea and no vomiting     Past Medical History:  Diagnosis Date   ADHD (attention deficit hyperactivity disorder)    Anemia    Anxiety    Bipolar disorder (HCC)    Depression    Headache    Nausea/vomiting in pregnancy 07/10/2018   Stress incontinence in female 02/16/2021   Past Surgical History:  Procedure Laterality Date   ADENOIDECTOMY     TONGUE SURGERY     TONSILLECTOMY     Family History  Problem Relation Age of Onset   Drug abuse Mother    Healthy Father    Cancer Maternal Grandmother    Thyroid disease Maternal Grandmother    Ovarian cancer Maternal Grandmother    Diabetes Paternal Grandmother    Hypertension Paternal Grandfather    Social History   Tobacco Use   Smoking status: Former    Types: Cigarettes    Passive exposure: Past   Smokeless tobacco: Never  Vaping Use   Vaping status: Never Used  Substance Use Topics   Alcohol use: Not Currently    Comment: occasionally   Drug use: Not Currently    Types: Marijuana   OB History     Gravida  4   Para  3   Term  3   Preterm      AB  1   Living  3      SAB      IAB  1   Ectopic      Multiple  0   Live Births  3          Review of Systems  Constitutional:  Negative for chills and fever.  Eyes:  Negative for discharge and redness.  Respiratory:  Negative for shortness of breath.   Cardiovascular:  Negative for  chest pain.  Gastrointestinal:  Negative for abdominal pain, nausea and vomiting.  Genitourinary:  Positive for dysuria and vaginal discharge.  Musculoskeletal:  Negative for back pain.    Allergies  Latex and Shellfish allergy  Home Medications   Prior to Admission medications   Medication Sig Start Date End Date Taking? Authorizing Provider  acetaminophen (TYLENOL) 325 MG tablet Take 2 tablets (650 mg total) by mouth every 4 (four) hours as needed (for pain scale < 4). 10/15/22   Mercado-Ortiz, Lahoma Crocker, DO  amoxicillin-clavulanate (AUGMENTIN) 875-125 MG tablet Take 1 tablet by mouth 2 (two) times daily. Patient not taking: Reported on 05/29/2023 10/31/22   Brock Bad, MD  benzocaine-Menthol (DERMOPLAST) 20-0.5 % AERO Apply 1 Application topically as needed for irritation (perineal discomfort). 10/15/22   Mercado-Ortiz, Lahoma Crocker, DO  ferrous sulfate 325 (65 FE) MG tablet Take 325 mg by mouth every other day.    [provider]  ibuprofen (ADVIL) 800 MG tablet Take 1 tablet (800 mg total) by mouth every 8 (eight) hours as needed. 10/31/22   Brock Bad, MD  metroNIDAZOLE (FLAGYL) 500 MG tablet Take  1 tablet (500 mg total) by mouth 2 (two) times daily. 06/03/23   Brock Bad, MD  senna-docusate (SENOKOT-S) 8.6-50 MG tablet Take 2 tablets by mouth daily. 10/15/22   Mercado-Ortiz, Lahoma Crocker, DO  witch hazel-glycerin (TUCKS) pad Apply 1 Application topically as needed for hemorrhoids. 10/15/22   Mercado-Ortiz, Lahoma Crocker, DO   Meds Ordered and Administered this Visit  Medications - No data to display  BP 104/65 (BP Location: Left Arm)   Pulse 88   Temp 98.4 F (36.9 C) (Oral)   Resp 16   SpO2 98%   Breastfeeding Yes  No data found.  Physical Exam Vitals and nursing note reviewed.  Constitutional:      General: She is not in acute distress.    Appearance: Normal appearance. She is not ill-appearing.  HENT:     Head: Normocephalic and atraumatic.  Eyes:      Conjunctiva/sclera: Conjunctivae normal.  Cardiovascular:     Rate and Rhythm: Normal rate.  Pulmonary:     Effort: Pulmonary effort is normal. No respiratory distress.  Neurological:     Mental Status: She is alert.  Psychiatric:        Mood and Affect: Mood normal.        Behavior: Behavior normal.        Thought Content: Thought content normal.     Urgent Care Course     Procedures (including critical care time)  Labs Review Labs Reviewed  URINE CULTURE - Abnormal; Notable for the following components:      Result Value   Culture   (*)    Value: 50,000 COLONIES/mL LACTOBACILLUS SPECIES Standardized susceptibility testing for this organism is not available. Performed at [facility], [address], [place], [place] [address]    All other components within normal limits  POCT URINALYSIS DIP (MANUAL ENTRY) - Abnormal; Notable for the following components:   Clarity, UA cloudy (*)    Ketones, POC UA trace (5) (*)    Spec Grav, UA >=1.030 (*)    Protein Ur, POC trace (*)    All other components within normal limits  POCT URINE PREGNANCY - Normal  CERVICOVAGINAL ANCILLARY ONLY    Imaging Review No results found.     MDM   1. Bilateral lower abdominal discomfort   2. Vaginal discharge     UA without signs of UTI.  Will order swab to rule out yeast, BV, STDs.  Will await results for further evaluation.      Tomi Bamberger, PA-C 11/30/23 [phone]

## 2023-12-04 ENCOUNTER — Ambulatory Visit: Payer: Medicaid Other | Admitting: Obstetrics & Gynecology

## 2024-01-01 ENCOUNTER — Encounter (HOSPITAL_COMMUNITY): Payer: Self-pay

## 2024-01-01 ENCOUNTER — Ambulatory Visit (HOSPITAL_COMMUNITY)
Admission: EM | Admit: 2024-01-01 | Discharge: 2024-01-01 | Disposition: A | Discharge status: Home / Self Care | Payer: Medicaid Other | Attending: Internal Medicine | Admitting: Internal Medicine

## 2024-01-01 DIAGNOSIS — N898 Other specified noninflammatory disorders of vagina: Secondary | ICD-10-CM | POA: Insufficient documentation

## 2024-01-01 DIAGNOSIS — N760 Acute vaginitis: Secondary | ICD-10-CM | POA: Diagnosis present

## 2024-01-01 DIAGNOSIS — R300 Dysuria: Secondary | ICD-10-CM | POA: Insufficient documentation

## 2024-01-01 LAB — POCT URINALYSIS DIP (MANUAL ENTRY)
Bilirubin, UA: NEGATIVE
Blood, UA: NEGATIVE
Glucose, UA: NEGATIVE mg/dL
Ketones, POC UA: NEGATIVE mg/dL
Leukocytes, UA: NEGATIVE
Nitrite, UA: NEGATIVE
Protein Ur, POC: 30 mg/dL — AB
Spec Grav, UA: 1.025 (ref 1.010–1.025)
Urobilinogen, UA: 0.2 U/dL
pH, UA: 7 (ref 5.0–8.0)

## 2024-01-01 LAB — POCT URINE PREGNANCY: Preg Test, Ur: NEGATIVE

## 2024-01-01 MED ORDER — CLOTRIMAZOLE 3 2 % VA CREA: 1.0000 | TOPICAL_CREAM | Freq: Every day | VAGINAL | 0 refills | Status: DC

## 2024-01-01 NOTE — Discharge Instructions (Signed)
 Your urine was normal with no evidence of infection.  I am concerned that you have a vaginal yeast infection.  Please try clotrimazole  vaginal cream nightly.  We will contact you if any to arrange additional treatment based on your swab result.  Wear loosefitting cotton underwear and use hypoallergenic soaps and detergents.  If anything worsens or changes and you have abdominal pain, pelvic pain, abnormal discharge, fever, nausea, vomiting you need to be seen immediately.

## 2024-01-01 NOTE — ED Triage Notes (Addendum)
 Pt presents with complaints of itchy white, thick vaginal discharge since wakening this AM. Pt denies any unusual odors. Pt also reporting burning with urination and pelvic discomfort. Pt currently rates her pain a 0/10, states she is just uncomfortable down there. Pt denies taking medications for symptoms reported/pain.

## 2024-01-01 NOTE — ED Provider Notes (Signed)
 [facility]    CSN: [id number] Arrival date & time: 01/01/24  1424      History   Chief Complaint Chief Complaint  Patient presents with   Vaginal Discharge    HPI Nicole Harris is a 28 y.o. female.   Patient presents today with 1 day history of vaginal discharge and irritation.  She describes the discharge as thick and white without odor.  She has associated itching and pruritus.  She does report some dysuria though this is more when the urine touches her skin rather than true dysuria.  Denies any frequency or urgency but does have some suprapubic pain.  Denies any abdominal pain, pelvic pain, fever, nausea, vomiting.  She denies any recent antibiotics.  Denies any medication changes.  She denies history of diabetes does not take SGLT2 inhibitor.  She does not believe that she could be pregnant.  She is breast-feeding.    Past Medical History:  Diagnosis Date   ADHD (attention deficit hyperactivity disorder)    Anemia    Anxiety    Bipolar disorder (HCC)    Depression    Headache    Nausea/vomiting in pregnancy 07/10/2018   Stress incontinence in female 02/16/2021    Patient Active Problem List   Diagnosis Date Noted   Anemia of mother in pregnancy, postpartum condition 10/14/2022   [redacted] weeks gestation of pregnancy 10/13/2022   Sciatica of right side 07/23/2022   Low grade squamous intraepithelial lesion (LGSIL) on cervical Pap smear 05/09/2022   Regularly irregular pulse rhythym 04/28/2022   Supervision of other normal pregnancy, antepartum 04/16/2022   Vaginal delivery 02/07/2021   History of depression 10/19/2020   Hx of delivery by vacuum extraction, currently pregnant 09/14/2020   Nausea and vomiting in pregnancy 07/10/2018    Past Surgical History:  Procedure Laterality Date   ADENOIDECTOMY     TONGUE SURGERY     TONSILLECTOMY      OB History     Gravida  4   Para  3   Term  3   Preterm      AB  1   Living  3      SAB       IAB  1   Ectopic      Multiple  0   Live Births  3            Home Medications    Prior to Admission medications   Medication Sig Start Date End Date Taking? Authorizing Provider  clotrimazole  (CLOTRIMAZOLE  3) 2 % vaginal cream Place 1 Applicatorful vaginally at bedtime. 01/01/24  Yes Maclovio Henson K, PA-C  acetaminophen  (TYLENOL ) 325 MG tablet Take 2 tablets (650 mg total) by mouth every 4 (four) hours as needed (for pain scale < 4). 10/15/22   Mercado-Ortiz, Harlene RODES, DO  benzocaine -Menthol  (DERMOPLAST) 20-0.5 % AERO Apply 1 Application topically as needed for irritation (perineal discomfort). 10/15/22   Mercado-Ortiz, Harlene RODES, DO  ibuprofen  (ADVIL ) 800 MG tablet Take 1 tablet (800 mg total) by mouth every 8 (eight) hours as needed. 10/31/22   Rudy Carlin DELENA, MD  senna-docusate (SENOKOT-S) 8.6-50 MG tablet Take 2 tablets by mouth daily. 10/15/22   Mercado-Ortiz, Harlene RODES, DO  witch hazel-glycerin  (TUCKS) pad Apply 1 Application topically as needed for hemorrhoids. 10/15/22   Mercado-Ortiz, Harlene RODES, DO    Family History Family History  Problem Relation Age of Onset   Drug abuse Mother    Healthy Father  Cancer Maternal Grandmother    Thyroid disease Maternal Grandmother    Ovarian cancer Maternal Grandmother    Diabetes Paternal Grandmother    Hypertension Paternal Grandfather     Social History Social History   Tobacco Use   Smoking status: Former    Types: Cigarettes    Passive exposure: Past   Smokeless tobacco: Never  Vaping Use   Vaping status: Never Used  Substance Use Topics   Alcohol use: Not Currently    Comment: occasionally   Drug use: Not Currently    Types: Marijuana     Allergies   Latex and Shellfish allergy   Review of Systems Review of Systems  Constitutional:  Positive for activity change. Negative for appetite change, fatigue and fever.  Gastrointestinal:  Negative for abdominal pain, diarrhea, nausea and vomiting.   Genitourinary:  Positive for dysuria, vaginal discharge and vaginal pain. Negative for frequency, pelvic pain, urgency and vaginal bleeding.     Physical Exam Triage Vital Signs ED Triage Vitals  Encounter Vitals Group     BP 01/01/24 1525 102/67     Systolic BP Percentile --      Diastolic BP Percentile --      Pulse Rate 01/01/24 1525 79     Resp 01/01/24 1525 18     Temp 01/01/24 1525 98.3 F (36.8 C)     Temp Source 01/01/24 1525 Oral     SpO2 01/01/24 1525 97 %     Weight 01/01/24 1527 132 lb (59.9 kg)     Height 01/01/24 1527 5' 4 (1.626 m)     Head Circumference --      Peak Flow --      Pain Score 01/01/24 1526 0     Pain Loc --      Pain Education --      Exclude from Growth Chart --    No data found.  Updated Vital Signs BP 102/67 (BP Location: Left Arm)   Pulse 79   Temp 98.3 F (36.8 C) (Oral)   Resp 18   Ht 5' 4 (1.626 m)   Wt 132 lb (59.9 kg)   LMP 12/05/2023 (Exact Date)   SpO2 97%   Breastfeeding Yes   BMI 22.66 kg/m   Visual Acuity Right Eye Distance:   Left Eye Distance:   Bilateral Distance:    Right Eye Near:   Left Eye Near:    Bilateral Near:     Physical Exam Vitals reviewed.  Constitutional:      General: She is awake. She is not in acute distress.    Appearance: Normal appearance. She is well-developed. She is not ill-appearing.     Comments: Very pleasant female appears stated age in no acute distress sitting comfortably in exam room  HENT:     Head: Normocephalic and atraumatic.     Mouth/Throat:     Pharynx: Uvula midline. No oropharyngeal exudate or posterior oropharyngeal erythema.  Cardiovascular:     Rate and Rhythm: Normal rate and regular rhythm.     Heart sounds: Normal heart sounds, S1 normal and S2 normal. No murmur heard. Pulmonary:     Effort: Pulmonary effort is normal.     Breath sounds: Normal breath sounds. No wheezing, rhonchi or rales.     Comments: Clear to auscultation bilaterally Abdominal:      General: Bowel sounds are normal.     Palpations: Abdomen is soft.     Tenderness: There is no abdominal tenderness. There  is no right CVA tenderness, left CVA tenderness, guarding or rebound.     Comments: Benign abdominal exam  Psychiatric:        Behavior: Behavior is cooperative.      UC Treatments / Results  Labs (all labs ordered are listed, but only abnormal results are displayed) Labs Reviewed  POCT URINALYSIS DIP (MANUAL ENTRY) - Abnormal; Notable for the following components:      Result Value   Protein Ur, POC =30 (*)    All other components within normal limits  POCT URINE PREGNANCY  CERVICOVAGINAL ANCILLARY ONLY    EKG   Radiology No results found.  Procedures Procedures (including critical care time)  Medications Ordered in UC Medications - No data to display  Initial Impression / Assessment and Plan / UC Course  I have reviewed the triage vital signs and the nursing notes.  Pertinent labs & imaging results that were available during my care of the patient were reviewed by me and considered in my medical decision making (see chart for details).     Patient is well-appearing, afebrile, nontoxic, nontachycardic.  Vital signs of physical exam are reassuring with no indication for emergent evaluation or imaging.  Urine pregnancy was negative.  UA was obtained as she did report some burning with urination but I suspect this is more related to burning when urine touches skin which is irritated from a yeast infection.  Her UA was essentially normal so will defer any antibiotics or additional testing for the time being.  STI swab was collected and is pending.  Will empirically treat for vaginal yeast infection with clotrimazole  she is actively breast-feeding.  We will contact her if any to arrange additional treatment based on her swab results.  She is to wear loosefitting cotton underwear and use hypoallergenic soaps and detergents.  We discussed that if her symptoms  are not improving within a few days or if anything worsens and she develops pelvic pain, abdominal pain, change in character of discharge, fever, nausea, vomiting she needs to be seen emergently.  Strict return precautions given.  Final Clinical Impressions(s) / UC Diagnoses   Final diagnoses:  Acute vaginitis  Vaginal discharge  Burning with urination     Discharge Instructions      Your urine was normal with no evidence of infection.  I am concerned that you have a vaginal yeast infection.  Please try clotrimazole  vaginal cream nightly.  We will contact you if any to arrange additional treatment based on your swab result.  Wear loosefitting cotton underwear and use hypoallergenic soaps and detergents.  If anything worsens or changes and you have abdominal pain, pelvic pain, abnormal discharge, fever, nausea, vomiting you need to be seen immediately.     ED Prescriptions     Medication Sig Dispense Auth. Provider   clotrimazole  (CLOTRIMAZOLE  3) 2 % vaginal cream Place 1 Applicatorful vaginally at bedtime. 21 g Kimra Kantor K, PA-C      PDMP not reviewed this encounter.   Sherrell Rocky POUR, PA-C 01/01/24 [phone]

## 2024-01-02 LAB — CERVICOVAGINAL ANCILLARY ONLY
Bacterial Vaginitis (gardnerella): POSITIVE — AB
Candida Glabrata: NEGATIVE
Candida Vaginitis: POSITIVE — AB
Chlamydia: NEGATIVE
Comment: NEGATIVE
Comment: NEGATIVE
Comment: NEGATIVE
Comment: NEGATIVE
Comment: NEGATIVE
Comment: NORMAL
Neisseria Gonorrhea: NEGATIVE
Trichomonas: NEGATIVE

## 2024-01-03 ENCOUNTER — Telehealth (HOSPITAL_COMMUNITY): Payer: Self-pay

## 2024-01-03 MED ORDER — METRONIDAZOLE 500 MG PO TABS: 500.0000 mg | ORAL_TABLET | Freq: Two times a day (BID) | ORAL | 0 refills | Status: AC

## 2024-01-03 MED ORDER — FLUCONAZOLE 150 MG PO TABS
150.0000 mg | ORAL_TABLET | ORAL | 0 refills | Status: AC
Start: 2024-01-03 — End: 2024-01-10

## 2024-01-03 NOTE — Telephone Encounter (Signed)
 Metronidazole and fluconazole are sent in for the BV and yeast

## 2024-01-03 NOTE — Telephone Encounter (Signed)
 Patient tested positive for BV and Candida. Please advise

## 2024-02-19 ENCOUNTER — Other Ambulatory Visit: Payer: Self-pay

## 2024-02-19 ENCOUNTER — Encounter (HOSPITAL_COMMUNITY): Payer: Self-pay

## 2024-02-19 ENCOUNTER — Emergency Department (HOSPITAL_COMMUNITY)
Admission: EM | Admit: 2024-02-19 | Discharge: 2024-02-19 | Disposition: A | Discharge status: Home / Self Care | Payer: Medicaid Other | Attending: Emergency Medicine | Admitting: Emergency Medicine

## 2024-02-19 DIAGNOSIS — Z79899 Other long term (current) drug therapy: Secondary | ICD-10-CM | POA: Insufficient documentation

## 2024-02-19 DIAGNOSIS — Z91040 Latex allergy status: Secondary | ICD-10-CM | POA: Diagnosis not present

## 2024-02-19 DIAGNOSIS — K529 Noninfective gastroenteritis and colitis, unspecified: Secondary | ICD-10-CM | POA: Diagnosis not present

## 2024-02-19 DIAGNOSIS — R519 Headache, unspecified: Secondary | ICD-10-CM | POA: Diagnosis present

## 2024-02-19 LAB — CBC
HCT: 37.6 % (ref 36.0–46.0)
Hemoglobin: 11.5 g/dL — ABNORMAL LOW (ref 12.0–15.0)
MCH: 25.6 pg — ABNORMAL LOW (ref 26.0–34.0)
MCHC: 30.6 g/dL (ref 30.0–36.0)
MCV: 83.7 fL (ref 80.0–100.0)
Platelets: 257 10*3/uL (ref 150–400)
RBC: 4.49 MIL/uL (ref 3.87–5.11)
RDW: 13.8 % (ref 11.5–15.5)
WBC: 10.6 10*3/uL — ABNORMAL HIGH (ref 4.0–10.5)
nRBC: 0 % (ref 0.0–0.2)

## 2024-02-19 LAB — URINALYSIS, ROUTINE W REFLEX MICROSCOPIC
Bilirubin Urine: NEGATIVE
Glucose, UA: NEGATIVE mg/dL
Hgb urine dipstick: NEGATIVE
Ketones, ur: 5 mg/dL — AB
Leukocytes,Ua: NEGATIVE
Nitrite: NEGATIVE
Protein, ur: NEGATIVE mg/dL
Specific Gravity, Urine: 1.025 (ref 1.005–1.030)
pH: 7 (ref 5.0–8.0)

## 2024-02-19 LAB — COMPREHENSIVE METABOLIC PANEL
ALT: 20 U/L (ref 0–44)
AST: 25 U/L (ref 15–41)
Albumin: 4.2 g/dL (ref 3.5–5.0)
Alkaline Phosphatase: 80 U/L (ref 38–126)
Anion gap: 13 (ref 5–15)
BUN: 10 mg/dL (ref 6–20)
CO2: 20 mmol/L — ABNORMAL LOW (ref 22–32)
Calcium: 9.5 mg/dL (ref 8.9–10.3)
Chloride: 104 mmol/L (ref 98–111)
Creatinine, Ser: 0.51 mg/dL (ref 0.44–1.00)
GFR, Estimated: >60 mL/min (ref >60)
Glucose, Bld: 130 mg/dL — ABNORMAL HIGH (ref 70–99)
Potassium: 3.6 mmol/L (ref 3.5–5.1)
Sodium: 137 mmol/L (ref 135–145)
Total Bilirubin: 0.7 mg/dL (ref 0.0–1.2)
Total Protein: 7.7 g/dL (ref 6.5–8.1)

## 2024-02-19 LAB — LIPASE, BLOOD: Lipase: 22 U/L (ref 11–51)

## 2024-02-19 LAB — RESP PANEL BY RT-PCR (RSV, FLU A&B, COVID)  RVPGX2
Influenza A by PCR: NEGATIVE
Influenza B by PCR: NEGATIVE
Resp Syncytial Virus by PCR: NEGATIVE
SARS Coronavirus 2 by RT PCR: NEGATIVE

## 2024-02-19 LAB — HCG, SERUM, QUALITATIVE: Preg, Serum: NEGATIVE

## 2024-02-19 MED ORDER — SODIUM CHLORIDE 0.9 % IV BOLUS
1000.0000 mL | Freq: Once | INTRAVENOUS | Status: AC
  Administered 2024-02-19: 1000 mL via INTRAVENOUS

## 2024-02-19 MED ORDER — ONDANSETRON 4 MG PO TBDP
4.0000 mg | ORAL_TABLET | Freq: Once | ORAL | Status: AC | PRN
  Administered 2024-02-19: 4 mg via ORAL
  Filled 2024-02-19: qty 1

## 2024-02-19 MED ORDER — ACETAMINOPHEN 500 MG PO TABS
1000.0000 mg | ORAL_TABLET | Freq: Once | ORAL | Status: AC
  Administered 2024-02-19: 1000 mg via ORAL
  Filled 2024-02-19: qty 2

## 2024-02-19 MED ORDER — ONDANSETRON HCL 4 MG PO TABS: 4.0000 mg | ORAL_TABLET | Freq: Four times a day (QID) | ORAL | 0 refills | Status: DC

## 2024-02-19 NOTE — Discharge Instructions (Addendum)
Please follow-up with your primary care provider in regards to symptoms and ER visit. Today your labs are reassuring you most likely a viral illness. Please remain hydrated and eat food as tolerated over the next few days.  You may consider a brat diet that includes bananas, rice, applesauce, toast. I prescribe Zofran to help with your nausea however you may also use Imodium over-the-counter to help with the diarrhea. You may use Tylenol every 6 hours as needed for pain. If symptoms change or worsen please return to the ER.

## 2024-02-19 NOTE — ED Triage Notes (Signed)
Headache that started today with nausea and vomiting. C/o chills, body aches

## 2024-02-19 NOTE — ED Notes (Signed)
 Patient d/c with home care instructions. IV discontinued.

## 2024-02-19 NOTE — ED Notes (Signed)
Pt made aware of UA sample. Unable to void att.

## 2024-02-19 NOTE — ED Provider Notes (Signed)
[place] [facility] AT [facility] Provider Note   CSN: [id number] Arrival date & time: 02/19/24  1621     History  Chief Complaint  Patient presents with   Headache    Nicole Harris is a 28 y.o. female history of bipolar, anxiety, anemia, chronic headache presented for nausea and vomiting diarrhea that began this morning.  Patient states that last week she had a GI bug and the rest of the family had a GI bug as well.  Patient states she has a headache and is similar to previous headaches.  Patient not take any medications for this.  Patient still breast-feeding.   Home Medications Prior to Admission medications   Medication Sig Start Date End Date Taking? Authorizing Provider  ondansetron (ZOFRAN) 4 MG tablet Take 1 tablet (4 mg total) by mouth every 6 (six) hours. 02/19/24  Yes Livvy Spilman, Beverly Gust, PA-C  acetaminophen (TYLENOL) 325 MG tablet Take 2 tablets (650 mg total) by mouth every 4 (four) hours as needed (for pain scale < 4). 10/15/22   Mercado-Ortiz, Lahoma Crocker, DO  benzocaine-Menthol (DERMOPLAST) 20-0.5 % AERO Apply 1 Application topically as needed for irritation (perineal discomfort). 10/15/22   Mercado-Ortiz, Lahoma Crocker, DO  clotrimazole (CLOTRIMAZOLE 3) 2 % vaginal cream Place 1 Applicatorful vaginally at bedtime. 01/01/24   Raspet, Noberto Retort, PA-C  ibuprofen (ADVIL) 800 MG tablet Take 1 tablet (800 mg total) by mouth every 8 (eight) hours as needed. 10/31/22   Brock Bad, MD  senna-docusate (SENOKOT-S) 8.6-50 MG tablet Take 2 tablets by mouth daily. 10/15/22   Mercado-Ortiz, Lahoma Crocker, DO  witch hazel-glycerin (TUCKS) pad Apply 1 Application topically as needed for hemorrhoids. 10/15/22   Mercado-Ortiz, Lahoma Crocker, DO      Allergies    Latex and Shellfish allergy    Review of Systems   Review of Systems  Neurological:  Positive for headaches.    Physical Exam Updated Vital Signs BP 117/66   Pulse 91   Temp 98 F (36.7 C) (Oral)   Resp 16    Ht 5\' 4"  (1.626 m)   Wt 59.9 kg   SpO2 100%   BMI 22.67 kg/m  Physical Exam Vitals reviewed.  Constitutional:      General: She is not in acute distress.    Appearance: She is normal weight.  HENT:     Head: Normocephalic and atraumatic.     Comments: No temporal artery tenderness No jaw claudication Eyes:     Extraocular Movements: Extraocular movements intact.     Conjunctiva/sclera: Conjunctivae normal.     Pupils: Pupils are equal, round, and reactive to light.  Cardiovascular:     Rate and Rhythm: Normal rate and regular rhythm.     Pulses: Normal pulses.     Heart sounds: Normal heart sounds.     Comments: 2+ bilateral radial/dorsalis pedis pulses with regular rate Pulmonary:     Effort: Pulmonary effort is normal. No respiratory distress.     Breath sounds: Normal breath sounds.  Abdominal:     Palpations: Abdomen is soft.     Tenderness: There is no abdominal tenderness. There is no guarding or rebound.  Musculoskeletal:        General: Normal range of motion.     Cervical back: Normal range of motion and neck supple. No rigidity or tenderness.     Comments: 5 out of 5 bilateral grip/leg extension strength  Skin:    General: Skin is warm and  dry.     Capillary Refill: Capillary refill takes less than 2 seconds.  Neurological:     General: No focal deficit present.     Mental Status: She is alert and oriented to person, place, and time.     Sensory: Sensation is intact.     Motor: Motor function is intact.     Coordination: Coordination is intact.     Gait: Gait is intact.     Comments: Sensation intact in all 4 limbs  Psychiatric:        Mood and Affect: Mood normal.     ED Results / Procedures / Treatments   Labs (all labs ordered are listed, but only abnormal results are displayed) Labs Reviewed  COMPREHENSIVE METABOLIC PANEL - Abnormal; Notable for the following components:      Result Value   CO2 20 (*)    Glucose, Bld 130 (*)    All other  components within normal limits  CBC - Abnormal; Notable for the following components:   WBC 10.6 (*)    Hemoglobin 11.5 (*)    MCH 25.6 (*)    All other components within normal limits  RESP PANEL BY RT-PCR (RSV, FLU A&B, COVID)  RVPGX2  LIPASE, BLOOD  HCG, SERUM, QUALITATIVE  URINALYSIS, ROUTINE W REFLEX MICROSCOPIC    EKG None  Radiology No results found.  Procedures Procedures    Medications Ordered in ED Medications  ondansetron (ZOFRAN-ODT) disintegrating tablet 4 mg (has no administration in time range)  sodium chloride 0.9 % bolus 1,000 mL (has no administration in time range)  acetaminophen (TYLENOL) tablet 1,000 mg (has no administration in time range)    ED Course/ Medical Decision Making/ A&P                                 Medical Decision Making Amount and/or Complexity of Data Reviewed Labs: ordered.  Risk Prescription drug management.   [name] 27 y.o. presented today for N/V/D. Working DDx that I considered at this time includes, but not limited to, viral illness, gastroenteritis, electrolyte abnormalities, dehydration, colitis, pancreatitis.  R/o DDx: gastroenteritis, electrolyte abnormalities, dehydration, colitis, pancreatitis: These are considered less likely due to history of present illness and physical exam findings  Review of prior external notes: 10/21/2022 discharge  Unique Tests and My Independent Interpretation:  CMP: Unremarkable CBC: Unremarkable Lipase: Unremarkable UA: Unremarkable Respiratory Panel: Negative hCG serum qualitative: Negative  Social Determinants of Health: none  Discussion with Independent Historian: None  Discussion of Management of Tests: None  Risk: Medium: prescription drug management  Risk Stratification Score: None  Plan: On exam patient was in no acute distress stable vitals.  Patient unremarkable physical exam.  Patient does endorse sick contacts and so we will get labs and give  fluids along with pain meds.  Patient is still breast-feeding so we will refrain from NSAIDs at this time.  Do anticipate discharge after p.o. challenge with primary care follow-up.  Patient's labs are reassuring.  Patient has been successfully p.o. challenge and is safe to be discharged at this time.  On recheck patient is feeling better after medications and fluids and is in agreement with discharge.  Do suspect patient has a viral illness and will encourage them to follow-up with them primary care provider in the next week.  Will prescribe Zofran and encouraged them to use Imodium over-the-counter.  I spoke to the patient about the  importance of remaining hydrated and having a brat diet in the meantime.  Patient was given return precautions. Patient stable for discharge at this time.  Patient verbalized understanding of plan.  This chart was dictated using voice recognition software.  Despite best efforts to proofread,  errors can occur which can change the documentation meaning.         Final Clinical Impression(s) / ED Diagnoses Final diagnoses:  Gastroenteritis    Rx / DC Orders ED Discharge Orders          Ordered    ondansetron (ZOFRAN) 4 MG tablet  Every 6 hours        02/19/24 1752              Remi Deter 02/19/24 1940    Gerhard Munch, MD 02/19/24 2012

## 2024-06-30 ENCOUNTER — Emergency Department (HOSPITAL_COMMUNITY)

## 2024-06-30 ENCOUNTER — Emergency Department (HOSPITAL_COMMUNITY)
Admission: EM | Admit: 2024-06-30 | Discharge: 2024-07-01 | Disposition: A | Discharge status: Home / Self Care | Attending: Student | Admitting: Student

## 2024-06-30 ENCOUNTER — Other Ambulatory Visit: Payer: Self-pay

## 2024-06-30 ENCOUNTER — Encounter (HOSPITAL_COMMUNITY): Payer: Self-pay | Admitting: *Deleted

## 2024-06-30 DIAGNOSIS — Z91040 Latex allergy status: Secondary | ICD-10-CM | POA: Diagnosis not present

## 2024-06-30 DIAGNOSIS — B9689 Other specified bacterial agents as the cause of diseases classified elsewhere: Secondary | ICD-10-CM | POA: Insufficient documentation

## 2024-06-30 DIAGNOSIS — M940 Chondrocostal junction syndrome [Tietze]: Secondary | ICD-10-CM | POA: Diagnosis not present

## 2024-06-30 DIAGNOSIS — R079 Chest pain, unspecified: Secondary | ICD-10-CM | POA: Diagnosis present

## 2024-06-30 DIAGNOSIS — N760 Acute vaginitis: Secondary | ICD-10-CM | POA: Insufficient documentation

## 2024-06-30 LAB — CBC
HCT: 34.7 % — ABNORMAL LOW (ref 36.0–46.0)
Hemoglobin: 10.8 g/dL — ABNORMAL LOW (ref 12.0–15.0)
MCH: 25.5 pg — ABNORMAL LOW (ref 26.0–34.0)
MCHC: 31.1 g/dL (ref 30.0–36.0)
MCV: 82 fL (ref 80.0–100.0)
Platelets: 248 10*3/uL (ref 150–400)
RBC: 4.23 MIL/uL (ref 3.87–5.11)
RDW: 15.5 % (ref 11.5–15.5)
WBC: 6 10*3/uL (ref 4.0–10.5)
nRBC: 0 % (ref 0.0–0.2)

## 2024-06-30 LAB — BASIC METABOLIC PANEL WITH GFR
Anion gap: 9 (ref 5–15)
BUN: 10 mg/dL (ref 6–20)
CO2: 24 mmol/L (ref 22–32)
Calcium: 9.4 mg/dL (ref 8.9–10.3)
Chloride: 104 mmol/L (ref 98–111)
Creatinine, Ser: 0.7 mg/dL (ref 0.44–1.00)
GFR, Estimated: >60 mL/min (ref >60)
Glucose, Bld: 107 mg/dL — ABNORMAL HIGH (ref 70–99)
Potassium: 3.7 mmol/L (ref 3.5–5.1)
Sodium: 137 mmol/L (ref 135–145)

## 2024-06-30 LAB — HCG, SERUM, QUALITATIVE: Preg, Serum: NEGATIVE

## 2024-06-30 LAB — TROPONIN I (HIGH SENSITIVITY): Troponin I (High Sensitivity): <2 ng/L (ref <18)

## 2024-06-30 NOTE — ED Provider Notes (Incomplete)
 [place] EMERGENCY DEPARTMENT AT [facility] Provider Note   CSN: [id number] Arrival date & time: 06/30/24  2113     Patient presents with: Chest Pain   NANDA BITTICK is a 28 y.o. female.  Chest Pain Associated symptoms: palpitations, shortness of breath and vomiting   Patient is a 28 year old female in the ED today with multiple complaints.  Reportedly was recently coming to the ED to be evaluated for possible BV versus yeast infection, noting that she has had some suprapubic abdominal pain and increased vaginal discharge, non-malodorous, normal color.  However has reported central sternal chest pain and squeezing sensation over central sternum that started suddenly 10 hours ago and has been persistent, coming in waves.  States that it originally was accompanied with a 10-minute episode of full body tingling, perioral tingling, bilateral hand tingling and a feeling of lightheadedness.  Stating that she had 1 episode of vomiting during this episode of tingling and also reported a hot flash.  She has not had an episode since.  Has noted to have mild shortness of breath, accompanied with some pleuritic chest pain, also reporting pain with yawning, stretching her neck.  LMP was 06/08/24.  Previous medical history of sinus arrhythmia with PVCs followed by cardiology in 2023, but was not able to follow-up with them since due to having a child.  Noted that symptoms have improved since then.  Notably also has.  Medical history of ADHD, anxiety, anemia (not taking iron ), bipolar disorder, stress incontinence.  Denies fever, headache, blurry vision, vertigo, hemoptysis, cough, congestion, abdominal pain, dysuria, hematuria, diarrhea, vaginal bleeding, lower leg swelling.     Prior to Admission medications   Medication Sig Start Date End Date Taking? Authorizing Provider  acetaminophen  (TYLENOL ) 325 MG tablet Take 2 tablets (650 mg total) by mouth every 4 (four) hours as needed (for  pain scale < 4). 10/15/22   Mercado-Ortiz, Harlene RODES, DO  benzocaine -Menthol  (DERMOPLAST) 20-0.5 % AERO Apply 1 Application topically as needed for irritation (perineal discomfort). 10/15/22   Mercado-Ortiz, Harlene RODES, DO  clotrimazole  (CLOTRIMAZOLE  3) 2 % vaginal cream Place 1 Applicatorful vaginally at bedtime. 01/01/24   Raspet, Erin K, PA-C  ibuprofen  (ADVIL ) 800 MG tablet Take 1 tablet (800 mg total) by mouth every 8 (eight) hours as needed. 10/31/22   Rudy Carlin DELENA, MD  ondansetron  (ZOFRAN ) 4 MG tablet Take 1 tablet (4 mg total) by mouth every 6 (six) hours. 02/19/24   Victor Lynwood DASEN, PA-C  senna-docusate (SENOKOT-S) 8.6-50 MG tablet Take 2 tablets by mouth daily. 10/15/22   Mercado-Ortiz, Harlene RODES, DO  witch hazel-glycerin  (TUCKS) pad Apply 1 Application topically as needed for hemorrhoids. 10/15/22   Mercado-Ortiz, Harlene RODES, DO    Allergies: Latex and Shellfish allergy    Review of Systems  Respiratory:  Positive for shortness of breath.   Cardiovascular:  Positive for chest pain and palpitations.  Gastrointestinal:  Positive for vomiting.  All other systems reviewed and are negative.   Updated Vital Signs BP 124/75 (BP Location: Left Arm)   Pulse 79   Temp 98.4 F (36.9 C) (Oral)   Resp 16   Ht 5' 4 (1.626 m)   Wt 59 kg   LMP 06/08/2024   SpO2 100%   Breastfeeding Unknown   BMI 22.31 kg/m   Physical Exam Vitals and nursing note reviewed.  Constitutional:      General: She is not in acute distress.    Appearance: Normal appearance. She is not  ill-appearing or diaphoretic.  HENT:     Head: Normocephalic and atraumatic.  Eyes:     Extraocular Movements: Extraocular movements intact.     Conjunctiva/sclera: Conjunctivae normal.     Pupils: Pupils are equal, round, and reactive to light.  Cardiovascular:     Rate and Rhythm: Normal rate.     Pulses: Normal pulses.     Heart sounds: Normal heart sounds. No murmur heard.    No friction rub. No gallop.      Comments: Patient noted to have pauses, then resumed by regular rhythm. Pulmonary:     Effort: Pulmonary effort is normal. No tachypnea, accessory muscle usage or respiratory distress.     Breath sounds: Normal breath sounds. No stridor. No decreased breath sounds, wheezing, rhonchi or rales.  Abdominal:     General: Abdomen is flat.     Palpations: Abdomen is soft.     Tenderness: There is no abdominal tenderness.  Skin:    General: Skin is warm and dry.  Neurological:     General: No focal deficit present.     Mental Status: She is alert. Mental status is at baseline.  Psychiatric:        Mood and Affect: Mood normal.     (all labs ordered are listed, but only abnormal results are displayed) Labs Reviewed  BASIC METABOLIC PANEL WITH GFR - Abnormal; Notable for the following components:      Result Value   Glucose, Bld 107 (*)    All other components within normal limits  CBC - Abnormal; Notable for the following components:   Hemoglobin 10.8 (*)    HCT 34.7 (*)    MCH 25.5 (*)    All other components within normal limits  HCG, SERUM, QUALITATIVE  TROPONIN I (HIGH SENSITIVITY)  TROPONIN I (HIGH SENSITIVITY)    EKG: None  Radiology: DG Chest 2 View Result Date: 06/30/2024 CLINICAL DATA:  Chest pain. EXAM: CHEST - 2 VIEW COMPARISON:  10/21/2022 FINDINGS: The cardiomediastinal contours are normal. The lungs are clear. Pulmonary vasculature is normal. No consolidation, pleural effusion, or pneumothorax. No acute osseous abnormalities are seen. IMPRESSION: No active cardiopulmonary disease. Electronically Signed   By: Andrea Gasman M.D.   On: 06/30/2024 21:53     Procedures   Medications Ordered in the ED - No data to display                            Geneva (Revised) Score: 3, Geneva Score Interpretation: Low Risk Group: 7-9% incidence of pulmonary embolism from several studies PERC Score: 0, PERC Score Interpretation: No need for further workup, as <2% chance of PE.   If no criteria are positive and clinicians pre-test probability is <15%, PERC Rule criteria are satisfied Medical Decision Making Amount and/or Complexity of Data Reviewed Labs: ordered. Radiology: ordered.   This patient is a ***  who presents to the ED for concern of ***.   Differential diagnoses prior to evaluation: The emergent differential diagnosis includes, but is not limited to, ACS, PE, peritonitis Boerhaave's, anxiety, costochondritis, chest wall strain, vulvovaginitis, vaginosis, cervicitis, PID, UTI,. This is not an exhaustive differential.   Past Medical History / Co-morbidities / Social History: ADHD, anxiety, anemia, bipolar disorder, stress incontinence  Additional history: Chart reviewed. Pertinent results include:   Seen in 2023 for PVCs.  EKG at that time showed PVCs with sinus arrhythmia.  Last seen in the ED on 02/19/2024 for  gastroenteritis.  Lab Tests/Imaging studies: I personally interpreted labs/imaging and the pertinent results include:  ***.   ***I agree with the radiologist interpretation.  Cardiac monitoring: EKG obtained and interpreted by myself and attending physician which shows: ***   Medications: I ordered medication including ***.  I have reviewed the patients home medicines and have made adjustments as needed.  Critical Interventions:  Social Determinants of Health:  Disposition: After consideration of the diagnostic results and the patients response to treatment, I feel that the patient would benefit from ***.   ***emergency department workup does not suggest an emergent condition requiring admission or immediate intervention beyond what has been performed at this time. The plan is: ***. The patient is safe for discharge and has been instructed to return immediately for worsening symptoms, change in symptoms or any other concerns.   {Document critical care time when appropriate  Document review of labs and clinical decision tools ie  [id number], etc  Document your independent review of radiology images and any outside records  Document your discussion with family members, caretakers and with consultants  Document social determinants of health affecting pt's care  Document your decision making why or why not admission, treatments were needed:[id number]:::1}   Final diagnoses:  None    ED Discharge Orders     None

## 2024-06-30 NOTE — ED Provider Notes (Signed)
 [place] EMERGENCY DEPARTMENT AT [facility] Provider Note   CSN: [id number] Arrival date & time: 06/30/24  2113     Patient presents with: Chest Pain   Nicole Harris is a 28 y.o. female.  Chest Pain Associated symptoms: palpitations, shortness of breath and vomiting   Patient is a 28 year old female in the ED today with multiple complaints.  Reportedly was recently coming to the ED to be evaluated for possible BV versus yeast infection, noting that she has had some suprapubic abdominal pain and increased vaginal discharge, non-malodorous, normal color.  However has reported central sternal chest pain and squeezing sensation over central sternum that started suddenly 10 hours ago and has been persistent, coming in waves.  States that it originally was accompanied with a 10-minute episode of full body tingling, perioral tingling, bilateral hand tingling and a feeling of lightheadedness.  Stating that she had 1 episode of vomiting during this episode of tingling and also reported a hot flash.  She has not had an episode since.  Has noted to have mild shortness of breath, accompanied with some pleuritic chest pain, also reporting pain with yawning, stretching her neck.  LMP was 06/08/24.  She is also been undergoing an increase in stress, with her grandmother reportedly having less than 2 weeks of life.  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  Previous medical history of sinus arrhythmia with PVCs followed by cardiology in 2023, but was not able to follow-up with them since due to having a child.  Noted that symptoms have improved since then.  Notably also has.  Medical history of ADHD, anxiety, anemia (not taking iron ), bipolar disorder, stress incontinence.  Denies fever, headache, blurry vision, vertigo, hemoptysis, cough, congestion, abdominal pain, dysuria, hematuria, diarrhea, vaginal bleeding, lower leg swelling.     Prior to Admission  medications   Medication Sig Start Date End Date Taking? Authorizing Provider  Capsaicin-Menthol -Methyl Sal (CAPSAICIN-METHYL SAL-MENTHOL ) 0.025-1-12 % CREA Apply 1 Application topically 2 (two) times daily as needed. 07/01/24  Yes Beola Terrall RAMAN, PA-C  metroNIDAZOLE  (FLAGYL ) 500 MG tablet Take 1 tablet (500 mg total) by mouth 2 (two) times daily. 07/01/24  Yes Beola Terrall RAMAN, PA-C  naproxen (NAPROSYN) 500 MG tablet Take 1 tablet (500 mg total) by mouth 2 (two) times daily. 07/01/24  Yes Shivaay Stormont S, PA-C  acetaminophen  (TYLENOL ) 325 MG tablet Take 2 tablets (650 mg total) by mouth every 4 (four) hours as needed (for pain scale < 4). 10/15/22   Mercado-Ortiz, Harlene RODES, DO  benzocaine -Menthol  (DERMOPLAST) 20-0.5 % AERO Apply 1 Application topically as needed for irritation (perineal discomfort). 10/15/22   Mercado-Ortiz, Harlene RODES, DO  clotrimazole  (CLOTRIMAZOLE  3) 2 % vaginal cream Place 1 Applicatorful vaginally at bedtime. 01/01/24   Raspet, Erin K, PA-C  ibuprofen  (ADVIL ) 800 MG tablet Take 1 tablet (800 mg total) by mouth every 8 (eight) hours as needed. 10/31/22   Rudy Carlin DELENA, MD  ondansetron  (ZOFRAN ) 4 MG tablet Take 1 tablet (4 mg total) by mouth every 6 (six) hours. 02/19/24   Victor Lynwood DASEN, PA-C  senna-docusate (SENOKOT-S) 8.6-50 MG tablet Take 2 tablets by mouth daily. 10/15/22   Mercado-Ortiz, Harlene RODES, DO  witch hazel-glycerin  (TUCKS) pad Apply 1 Application topically as needed for hemorrhoids. 10/15/22   Mercado-Ortiz, Harlene RODES, DO    Allergies: Latex and Shellfish allergy    Review of Systems  Respiratory:  Positive for shortness of breath.   Cardiovascular:  Positive for chest pain and palpitations.  Gastrointestinal:  Positive for vomiting.  All other systems reviewed and are negative.   Updated Vital Signs BP (!) 117/58   Pulse 79   Temp 98.4 F (36.9 C) (Oral)   Resp 10   Ht 5' 4 (1.626 m)   Wt 59 kg   LMP 06/08/2024   SpO2 100%   Breastfeeding Unknown    BMI 22.31 kg/m   Physical Exam Vitals and nursing note reviewed.  Constitutional:      General: She is not in acute distress.    Appearance: Normal appearance. She is not ill-appearing or diaphoretic.  HENT:     Head: Normocephalic and atraumatic.     Mouth/Throat:     Mouth: Mucous membranes are moist.     Pharynx: Oropharynx is clear. No oropharyngeal exudate or posterior oropharyngeal erythema.  Eyes:     General: No scleral icterus.       Right eye: No discharge.        Left eye: No discharge.     Extraocular Movements: Extraocular movements intact.     Conjunctiva/sclera: Conjunctivae normal.     Pupils: Pupils are equal, round, and reactive to light.  Cardiovascular:     Rate and Rhythm: Normal rate.     Pulses: Normal pulses.     Heart sounds: Normal heart sounds. No murmur heard.    No friction rub. No gallop.     Comments: Patient noted to have pauses, then resumed by regular rhythm. Pulmonary:     Effort: Pulmonary effort is normal. No tachypnea, accessory muscle usage or respiratory distress.     Breath sounds: Normal breath sounds. No stridor. No decreased breath sounds, wheezing, rhonchi or rales.  Chest:     Chest wall: Tenderness (Central sternal chest wall tenderness noted to palpation) present.  Abdominal:     General: Abdomen is flat. There is no distension.     Palpations: Abdomen is soft.     Tenderness: There is no abdominal tenderness. There is no right CVA tenderness, left CVA tenderness or guarding.  Musculoskeletal:        General: No deformity.     Cervical back: Normal range of motion and neck supple. No rigidity or tenderness.     Right lower leg: No edema.     Left lower leg: No edema.  Skin:    General: Skin is warm and dry.     Findings: No bruising, erythema or lesion.  Neurological:     General: No focal deficit present.     Mental Status: She is alert. Mental status is at baseline.     Sensory: No sensory deficit.     Motor: No  weakness.     Coordination: Coordination normal.     Gait: Gait normal.  Psychiatric:        Mood and Affect: Mood normal.     (all labs ordered are listed, but only abnormal results are displayed) Labs Reviewed  WET PREP, GENITAL - Abnormal; Notable for the following components:      Result Value   Clue Cells Wet Prep HPF POC PRESENT (*)    All other components within normal limits  BASIC METABOLIC PANEL WITH GFR - Abnormal; Notable for the following components:   Glucose, Bld 107 (*)    All other components within normal limits  CBC - Abnormal; Notable for the following components:   Hemoglobin 10.8 (*)    HCT 34.7 (*)  MCH 25.5 (*)    All other components within normal limits  URINALYSIS, ROUTINE W REFLEX MICROSCOPIC - Abnormal; Notable for the following components:   Color, Urine STRAW (*)    All other components within normal limits  HCG, SERUM, QUALITATIVE  GC/CHLAMYDIA PROBE AMP ([place]) NOT AT [facility]  TROPONIN I (HIGH SENSITIVITY)  TROPONIN I (HIGH SENSITIVITY)    EKG: None  Radiology: DG Chest 2 View Result Date: 06/30/2024 CLINICAL DATA:  Chest pain. EXAM: CHEST - 2 VIEW COMPARISON:  10/21/2022 FINDINGS: The cardiomediastinal contours are normal. The lungs are clear. Pulmonary vasculature is normal. No consolidation, pleural effusion, or pneumothorax. No acute osseous abnormalities are seen. IMPRESSION: No active cardiopulmonary disease. Electronically Signed   By: Andrea Gasman M.D.   On: 06/30/2024 21:53     Procedures   Medications Ordered in the ED  metroNIDAZOLE  (FLAGYL ) tablet 500 mg (500 mg Oral Given 07/01/24 0114)                              [place]onda (Revised) Score: 3, Geneva Score Interpretation: Low Risk Group: 7-9% incidence of pulmonary embolism from several studies PERC Score: 0, PERC Score Interpretation: No need for further workup, as <2% chance of PE.  If no criteria are positive and clinicians pre-test probability is <15%, PERC Rule  criteria are satisfied Medical Decision Making Amount and/or Complexity of Data Reviewed Labs: ordered. Radiology: ordered.   This patient is a 28 year old female who presents to the ED for concern of multiple complaints.  Notably was originally going to ED to be evaluated for symptoms that she believes were similar to previous times where she was diagnosed with BV which includes suprapubic discomfort and white vaginal discharge. However noted that she was also started to experience some central sternal chest pain specifically on palpation and movement, which was then also accompanied with gave him an episode of full body tingling including perioral tingling and shortness of breath that resolved.  Currently she is feeling significantly better however still experiencing central sternal chest pain.  Notably is appears medical history of PVCs, seen by cardiology 2 years ago but is not followed up since.  Symptoms have been continuous but improved.  On physical exam, patient is in no acute distress, afebrile, alert and orient x 4, speaking in full sentences, nontachypneic, nontachycardic.  Notably has central sternal chest wall tenderness to palpation, positive crowing rooster maneuver.  No chest wall crepitus, ecchymosis, erythema.  LCTAB, RRR, no murmur.  No lower leg swelling.  Unremarkable exam otherwise.  Labs did show BV, provided metronidazole  here and will send, metronidazole .  But were otherwise unremarkable.  Suspect that chest pain most likely due to costochondritis and initial episode was likely secondary to panic attack.  With patient saying that she is under new stress as well as episode being short in nature with previous history of anxiety.  However we will still have her follow-up with cardiology, she says that she still has access to schedule appointment with her cardiologist she saw previously.  Will also have her continue to take anti-inflammatories for the costochondritis and return to  the ED for any new or worsening symptoms.  Follow-up with PCP for any persistent symptoms.  Patient vital signs have remained stable throughout the course of patient's time in the ED. Low suspicion for any other emergent pathology at this time. I believe this patient is safe to be discharged. Provided strict return  to ER precautions. Patient expressed agreement and understanding of plan. All questions were answered.  Differential diagnoses prior to evaluation: The emergent differential diagnosis includes, but is not limited to, ACS, PE, peritonitis Boerhaave's, anxiety, costochondritis, chest wall strain, vulvovaginitis, vaginosis, cervicitis, PID, UTI,. This is not an exhaustive differential.   Past Medical History / Co-morbidities / Social History: ADHD, anxiety, anemia, bipolar disorder, stress incontinence  Additional history: Chart reviewed. Pertinent results include:   Seen in 2023 for PVCs.  EKG at that time showed PVCs with sinus arrhythmia.  Last seen in the ED on 02/19/2024 for gastroenteritis.  Lab Tests/Imaging studies: I personally interpreted labs/imaging and the pertinent results include:   CBC shows anemia with hemoglobin 10.8, increased from previous however patient is still not taking iron  BMP unremarkable hCG qualitative negative UA negative Wet prep shows clue cells . Chest x-ray unremarkable  I agree with the radiologist interpretation.  Cardiac monitoring: EKG obtained and interpreted by myself and attending physician which shows: Sinus rhythm with PVCs   Medications: I ordered medication including metronidazole .  I have reviewed the patients home medicines and have made adjustments as needed.  Critical Interventions: None  Social Determinants of Health: Has good follow-up with cardiologist  Disposition: After consideration of the diagnostic results and the patients response to treatment, I feel that the patient would benefit from discharge and treatment as  above.   emergency department workup does not suggest an emergent condition requiring admission or immediate intervention beyond what has been performed at this time. The plan is: Follow-up with cardiology, naproxen and capsaicin cream for costochondritis, limit activity, metronidazole  for BV, return to the ED for new or worsening symptoms, follow-up PCP for any persistent symptoms. The patient is safe for discharge and has been instructed to return immediately for worsening symptoms, change in symptoms or any other concerns.     Final diagnoses:  Costochondritis  BV (bacterial vaginosis)    ED Discharge Orders          Ordered    naproxen (NAPROSYN) 500 MG tablet  2 times daily        07/01/24 0145    Capsaicin-Menthol -Methyl Sal (CAPSAICIN-METHYL SAL-MENTHOL ) 0.025-1-12 % CREA  2 times daily PRN        07/01/24 0146    metroNIDAZOLE  (FLAGYL ) 500 MG tablet  2 times daily        07/01/24 0146               Solae Norling S, PA-C 07/01/24 0304    Albertina Dixon, MD 07/01/24 [phone]

## 2024-06-30 NOTE — ED Triage Notes (Addendum)
 Pt initially was coming in for a yeast infection( c/o lower abd pain yesterday, hx BV). Reports this afternoon, centralized chest pain radiating into throat with SOB and tachycardia. Hx of irregular heart rate and is followed by cardiology

## 2024-07-01 LAB — URINALYSIS, ROUTINE W REFLEX MICROSCOPIC
Bilirubin Urine: NEGATIVE
Glucose, UA: NEGATIVE mg/dL
Hgb urine dipstick: NEGATIVE
Ketones, ur: NEGATIVE mg/dL
Leukocytes,Ua: NEGATIVE
Nitrite: NEGATIVE
Protein, ur: NEGATIVE mg/dL
Specific Gravity, Urine: 1.005 (ref 1.005–1.030)
pH: 8 (ref 5.0–8.0)

## 2024-07-01 LAB — WET PREP, GENITAL
Sperm: NONE SEEN
Trich, Wet Prep: NONE SEEN
WBC, Wet Prep HPF POC: <10 (ref <10)
Yeast Wet Prep HPF POC: NONE SEEN

## 2024-07-01 LAB — TROPONIN I (HIGH SENSITIVITY): Troponin I (High Sensitivity): <2 ng/L (ref <18)

## 2024-07-01 MED ORDER — METRONIDAZOLE 500 MG PO TABS: 500.0000 mg | ORAL_TABLET | Freq: Two times a day (BID) | ORAL | 0 refills | Status: DC

## 2024-07-01 MED ORDER — NAPROXEN 500 MG PO TABS: 500.0000 mg | ORAL_TABLET | Freq: Two times a day (BID) | ORAL | 0 refills | Status: DC

## 2024-07-01 MED ORDER — METRONIDAZOLE 500 MG PO TABS
500.0000 mg | ORAL_TABLET | Freq: Once | ORAL | Status: AC
  Administered 2024-07-01: 500 mg via ORAL
  Filled 2024-07-01: qty 1

## 2024-07-01 MED ORDER — ZIKS ARTHRITIS PAIN RELIEF 0.025-1-12 % EX CREA: 1.0000 | TOPICAL_CREAM | Freq: Two times a day (BID) | CUTANEOUS | 0 refills | Status: DC | PRN

## 2024-07-01 NOTE — Discharge Instructions (Addendum)
 You were seen today for costochondritis and BV.  Believe that the costochondritis is likely due to the persistent lifting of your 26-year-old.  Recommend you continue to rest, limiting activity until symptoms have resolved.  You continue to use naproxen as well as capsaicin cream over your sternum for relief.  We should also finish the full course of metronidazole  taking it twice daily for the neck 7 days.  Be sure to wait a few hours after taking medication before you breast-feed.  Follow-up with PCP as needed for any persistent symptoms or return to the ED for any new or worsening symptoms.  Recommend continue to follow-up with cardiology to have your palpitations evaluated.  Also recommend following up with PCP if you begin to have any new or worsening symptoms of anxiety.

## 2024-07-02 LAB — GC/CHLAMYDIA PROBE AMP (~~LOC~~) NOT AT ARMC
Chlamydia: NEGATIVE
Comment: NEGATIVE
Comment: NORMAL
Neisseria Gonorrhea: NEGATIVE

## 2024-08-10 ENCOUNTER — Other Ambulatory Visit: Payer: Self-pay

## 2024-08-10 ENCOUNTER — Encounter (HOSPITAL_BASED_OUTPATIENT_CLINIC_OR_DEPARTMENT_OTHER): Payer: Self-pay

## 2024-08-10 DIAGNOSIS — Z91040 Latex allergy status: Secondary | ICD-10-CM | POA: Diagnosis not present

## 2024-08-10 DIAGNOSIS — J019 Acute sinusitis, unspecified: Secondary | ICD-10-CM | POA: Insufficient documentation

## 2024-08-10 DIAGNOSIS — R519 Headache, unspecified: Secondary | ICD-10-CM | POA: Diagnosis present

## 2024-08-10 NOTE — ED Triage Notes (Signed)
 Pt states in the last month she has been feeling this pressure in her face, she points at her sinus cavities when describing it. She has tried nasal lavage, and to the extent of putting Qtips up her nose which has caused some irritation. She mentions she mentions she can feel it in the roof of her mouth. No other complaints at this time.

## 2024-08-11 ENCOUNTER — Emergency Department (HOSPITAL_BASED_OUTPATIENT_CLINIC_OR_DEPARTMENT_OTHER)
Admission: EM | Admit: 2024-08-11 | Discharge: 2024-08-11 | Disposition: A | Discharge status: Home / Self Care | Attending: Emergency Medicine | Admitting: Emergency Medicine

## 2024-08-11 DIAGNOSIS — J019 Acute sinusitis, unspecified: Secondary | ICD-10-CM

## 2024-08-11 MED ORDER — TRIAMCINOLONE ACETONIDE 55 MCG/ACT NA AERO: 1.0000 | INHALATION_SPRAY | Freq: Two times a day (BID) | NASAL | 0 refills | Status: DC

## 2024-08-11 MED ORDER — DOXYCYCLINE HYCLATE 100 MG PO CAPS: 100.0000 mg | ORAL_CAPSULE | Freq: Two times a day (BID) | ORAL | 0 refills | Status: DC

## 2024-08-11 NOTE — ED Provider Notes (Signed)
 [place] EMERGENCY DEPARTMENT AT [facility] Provider Note   CSN: [id number] Arrival date & time: 08/10/24  2125     Patient presents with: Facial Pain   Nicole Harris is a 28 y.o. female.   Patient is a 28 year old female presenting with facial pain.  She reports nasal congestion and occasional postnasal drip.  She reports using Q-tips to clean her nose and wants to make sure that she did not damage anything.  She occasionally notices blood when she blows her nose, but denies any bleeding otherwise.  No fevers or chills.       Prior to Admission medications   Medication Sig Start Date End Date Taking? Authorizing Provider  acetaminophen  (TYLENOL ) 325 MG tablet Take 2 tablets (650 mg total) by mouth every 4 (four) hours as needed (for pain scale < 4). 10/15/22   Mercado-Ortiz, Harlene RODES, DO  benzocaine -Menthol  (DERMOPLAST) 20-0.5 % AERO Apply 1 Application topically as needed for irritation (perineal discomfort). 10/15/22   Mercado-Ortiz, Harlene RODES, DO  Capsaicin-Menthol -Methyl Sal (CAPSAICIN-METHYL SAL-MENTHOL ) 0.025-1-12 % CREA Apply 1 Application topically 2 (two) times daily as needed. 07/01/24   Bauer, Collin S, PA-C  clotrimazole  (CLOTRIMAZOLE  3) 2 % vaginal cream Place 1 Applicatorful vaginally at bedtime. 01/01/24   Raspet, Erin K, PA-C  ibuprofen  (ADVIL ) 800 MG tablet Take 1 tablet (800 mg total) by mouth every 8 (eight) hours as needed. 10/31/22   Rudy Carlin DELENA, MD  metroNIDAZOLE  (FLAGYL ) 500 MG tablet Take 1 tablet (500 mg total) by mouth 2 (two) times daily. 07/01/24   Bauer, Collin S, PA-C  naproxen  (NAPROSYN ) 500 MG tablet Take 1 tablet (500 mg total) by mouth 2 (two) times daily. 07/01/24   Bauer, Collin S, PA-C  ondansetron  (ZOFRAN ) 4 MG tablet Take 1 tablet (4 mg total) by mouth every 6 (six) hours. 02/19/24   Victor Lynwood DASEN, PA-C  senna-docusate (SENOKOT-S) 8.6-50 MG tablet Take 2 tablets by mouth daily. 10/15/22   Mercado-Ortiz, Harlene RODES, DO  witch  hazel-glycerin  (TUCKS) pad Apply 1 Application topically as needed for hemorrhoids. 10/15/22   Mercado-Ortiz, Harlene RODES, DO    Allergies: Latex and Shellfish allergy    Review of Systems  All other systems reviewed and are negative.   Updated Vital Signs BP 124/76   Pulse 91   Temp 98 F (36.7 C)   Resp 17   SpO2 100%   Physical Exam Vitals and nursing note reviewed.  Constitutional:      Appearance: Normal appearance.  HENT:     Nose: Nose normal. No congestion or rhinorrhea.  Pulmonary:     Effort: Pulmonary effort is normal.  Neurological:     Mental Status: She is alert.     (all labs ordered are listed, but only abnormal results are displayed) Labs Reviewed - No data to display  EKG: None  Radiology: No results found.   Procedures   Medications Ordered in the ED - No data to display                                  Medical Decision Making  Patient presenting with facial pain and suspected sinusitis.  She will be treated with antibiotics and nasal steroids.  To return as needed for any problems.     Final diagnoses:  None    ED Discharge Orders     None  Geroldine Berg, MD 08/11/24 [phone]

## 2024-08-11 NOTE — Discharge Instructions (Signed)
 Begin taking doxycycline  as prescribed.  Begin using Nasacort  as prescribed.  Follow-up with primary doctor if not improving in the next week.

## 2024-10-11 ENCOUNTER — Ambulatory Visit (HOSPITAL_COMMUNITY)
Admission: EM | Admit: 2024-10-11 | Discharge: 2024-10-11 | Disposition: A | Discharge status: Home / Self Care | Attending: Internal Medicine | Admitting: Internal Medicine

## 2024-10-11 ENCOUNTER — Encounter (HOSPITAL_COMMUNITY): Payer: Self-pay | Admitting: *Deleted

## 2024-10-11 ENCOUNTER — Other Ambulatory Visit: Payer: Self-pay

## 2024-10-11 DIAGNOSIS — Z113 Encounter for screening for infections with a predominantly sexual mode of transmission: Secondary | ICD-10-CM | POA: Insufficient documentation

## 2024-10-11 DIAGNOSIS — J0191 Acute recurrent sinusitis, unspecified: Secondary | ICD-10-CM | POA: Insufficient documentation

## 2024-10-11 DIAGNOSIS — N760 Acute vaginitis: Secondary | ICD-10-CM | POA: Diagnosis not present

## 2024-10-11 DIAGNOSIS — H6123 Impacted cerumen, bilateral: Secondary | ICD-10-CM | POA: Diagnosis not present

## 2024-10-11 LAB — POCT URINALYSIS DIP (MANUAL ENTRY)
Bilirubin, UA: NEGATIVE
Blood, UA: NEGATIVE
Glucose, UA: NEGATIVE mg/dL
Ketones, POC UA: NEGATIVE mg/dL
Leukocytes, UA: NEGATIVE
Nitrite, UA: NEGATIVE
Protein Ur, POC: NEGATIVE mg/dL
Spec Grav, UA: 1.025 (ref 1.010–1.025)
Urobilinogen, UA: 0.2 U/dL
pH, UA: 6.5 (ref 5.0–8.0)

## 2024-10-11 LAB — POCT URINE PREGNANCY: Preg Test, Ur: NEGATIVE

## 2024-10-11 MED ORDER — AMOXICILLIN-POT CLAVULANATE 875-125 MG PO TABS: 1.0000 | ORAL_TABLET | Freq: Two times a day (BID) | ORAL | 0 refills | Status: DC

## 2024-10-11 MED ORDER — FLUTICASONE PROPIONATE 50 MCG/ACT NA SUSP: 2.0000 | Freq: Every day | NASAL | 0 refills | Status: DC

## 2024-10-11 NOTE — ED Provider Notes (Signed)
 [facility]    CSN: [id number] Arrival date & time: 10/11/24  1624      History   Chief Complaint Chief Complaint  Patient presents with   Facial Pain   Nasal Congestion   Abdominal Pain    HPI Nicole Harris is a 28 y.o. female who presents with with unresolved sinus infection and still having face pain which is worsening in the past 2-3 weeks. She only took for 3.5 days when diagnosed with sinusitis in August, and after misplacing it she could not find it. Has been having sinus pain. Has not had a fever. Has noticed decreased hearing and would like her ears checked.   2- also wants BV testing,  has had suprapubic pain but denies abnormal vaginal discharge. This is the only symptoms she gets when she gets BV. She is in a monogamous relationship with her kids father.     Past Medical History:  Diagnosis Date   ADHD (attention deficit hyperactivity disorder)    Anemia    Anxiety    Bipolar disorder (HCC)    Depression    Headache    Nausea/vomiting in pregnancy 07/10/2018   Stress incontinence in female 02/16/2021    Patient Active Problem List   Diagnosis Date Noted   Anemia of mother in pregnancy, postpartum condition 10/14/2022   [redacted] weeks gestation of pregnancy 10/13/2022   Sciatica of right side 07/23/2022   Low grade squamous intraepithelial lesion (LGSIL) on cervical Pap smear 05/09/2022   Regularly irregular pulse rhythym 04/28/2022   Supervision of other normal pregnancy, antepartum 04/16/2022   Vaginal delivery 02/07/2021   History of depression 10/19/2020   Hx of delivery by vacuum extraction, currently pregnant 09/14/2020   Nausea and vomiting in pregnancy 07/10/2018    Past Surgical History:  Procedure Laterality Date   ADENOIDECTOMY     TONGUE SURGERY     TONSILLECTOMY      OB History     Gravida  4   Para  3   Term  3   Preterm      AB  1   Living  3      SAB      IAB  1   Ectopic      Multiple  0   Live  Births  3            Home Medications    Prior to Admission medications   Medication Sig Start Date End Date Taking? Authorizing Provider  amoxicillin -clavulanate (AUGMENTIN ) 875-125 MG tablet Take 1 tablet by mouth every 12 (twelve) hours. 10/11/24  Yes Rodriguez-Southworth, Hasani Diemer, PA-C  fluticasone  (FLONASE ) 50 MCG/ACT nasal spray Place 2 sprays into both nostrils daily. 10/11/24  Yes Rodriguez-Southworth, Deaveon Schoen, PA-C  acetaminophen  (TYLENOL ) 325 MG tablet Take 2 tablets (650 mg total) by mouth every 4 (four) hours as needed (for pain scale < 4). 10/15/22   Mercado-Ortiz, Harlene RODES, DO  benzocaine -Menthol  (DERMOPLAST) 20-0.5 % AERO Apply 1 Application topically as needed for irritation (perineal discomfort). 10/15/22   Mercado-Ortiz, Harlene RODES, DO  ibuprofen  (ADVIL ) 800 MG tablet Take 1 tablet (800 mg total) by mouth every 8 (eight) hours as needed. 10/31/22   Rudy Carlin DELENA, MD  senna-docusate (SENOKOT-S) 8.6-50 MG tablet Take 2 tablets by mouth daily. 10/15/22   Mercado-Ortiz, Harlene RODES, DO  witch hazel-glycerin  (TUCKS) pad Apply 1 Application topically as needed for hemorrhoids. 10/15/22   Mercado-Ortiz, Harlene RODES, DO    Family History Family History  Problem Relation Age of Onset   Drug abuse Mother    Healthy Father    Cancer Maternal Grandmother    Thyroid disease Maternal Grandmother    Ovarian cancer Maternal Grandmother    Diabetes Paternal Grandmother    Hypertension Paternal Grandfather     Social History Social History   Tobacco Use   Smoking status: Former    Types: Cigarettes    Passive exposure: Past   Smokeless tobacco: Never  Vaping Use   Vaping status: Never Used  Substance Use Topics   Alcohol use: Not Currently    Comment: occasionally   Drug use: Not Currently    Types: Marijuana     Allergies   Latex and Shellfish allergy   Review of Systems Review of Systems As noted in HPI  Physical Exam Triage Vital Signs ED Triage Vitals   Encounter Vitals Group     BP 10/11/24 1729 117/78     Girls Systolic BP Percentile --      Girls Diastolic BP Percentile --      Boys Systolic BP Percentile --      Boys Diastolic BP Percentile --      Pulse Rate 10/11/24 1729 (!) 101     Resp 10/11/24 1729 16     Temp 10/11/24 1729 98.1 F (36.7 C)     Temp Source 10/11/24 1729 Oral     SpO2 10/11/24 1729 98 %     Weight --      Height --      Head Circumference --      Peak Flow --      Pain Score 10/11/24 1730 4     Pain Loc --      Pain Education --      Exclude from Growth Chart --    No data found.  Updated Vital Signs BP 117/78   Pulse (!) 101   Temp 98.1 F (36.7 C) (Oral)   Resp 16   LMP 09/30/2024 (Exact Date)   SpO2 98%   Breastfeeding No   Visual Acuity Right Eye Distance:   Left Eye Distance:   Bilateral Distance:    Right Eye Near:   Left Eye Near:    Bilateral Near:     Physical Exam Vitals and nursing note reviewed.  Constitutional:      General: She is not in acute distress.    Appearance: She is not toxic-appearing.  HENT:     Right Ear: There is impacted cerumen.     Left Ear: There is impacted cerumen.     Ears:     Comments: Lavage attempted and only some of the wax was removed from the R, so area cleared revealed normal TM. L was still full of wax. She could not tolerate to finish the lavage due to pain.     Nose: Nose normal.     Comments: Maxillary and frontal sinuses are tender Eyes:     General: No scleral icterus.    Conjunctiva/sclera: Conjunctivae normal.  Cardiovascular:     Rate and Rhythm: Normal rate and regular rhythm.  Pulmonary:     Effort: Pulmonary effort is normal.     Breath sounds: Normal breath sounds.  Abdominal:     General: Bowel sounds are normal.     Palpations: Abdomen is soft. There is no mass.     Tenderness: There is no abdominal tenderness. There is no guarding or rebound.  Musculoskeletal:  General: Normal range of motion.     Cervical  back: Neck supple.  Lymphadenopathy:     Cervical: No cervical adenopathy.  Skin:    General: Skin is warm and dry.  Neurological:     Mental Status: She is alert and oriented to person, place, and time.     Gait: Gait normal.  Psychiatric:        Mood and Affect: Mood normal.        Behavior: Behavior normal.        Thought Content: Thought content normal.        Judgment: Judgment normal.      UC Treatments / Results  Labs (all labs ordered are listed, but only abnormal results are displayed) Labs Reviewed  POCT URINALYSIS DIP (MANUAL ENTRY)  POCT URINE PREGNANCY  CERVICOVAGINAL ANCILLARY ONLY    EKG   Radiology No results found.  Procedures Procedures (including critical care time)  Medications Ordered in UC Medications - No data to display  Initial Impression / Assessment and Plan / UC Course  I have reviewed the triage vital signs and the nursing notes.  Pertinent labs  results that were available during my care of the patient were reviewed by me and considered in my medical decision making (see chart for details).  Unresolved sinusitis Suprapubic pain r/o BV Cerumen impaction.   Vaginal swab sent out and we will inform her of the results when back. I placed her on Augmentin  and Flonase  as noted. I advised her and showed her a video how to do saline nose rinses and to do them bid x 5 days.  Advised to soak her ears with Debrox and have ear lavage done in 7-10 days.    Final Clinical Impressions(s) / UC Diagnoses   Final diagnoses:  Screen for STD (sexually transmitted disease)  Vaginitis and vulvovaginitis  Acute recurrent sinusitis, unspecified location  Bilateral hearing loss due to cerumen impaction     Discharge Instructions      Do saline nose rinses twice a day for 5 days Do not use tap water, only boiled water that has been cooled down.  Do it twice a day  for 5-7 days, but avoid bed time.  Call your primary care doctor and make an  appointment for 7-10 days.    Soak your ears with debrox for 10 minutes each side for one week and have your primary care doctor wash the wax out.      ED Prescriptions     Medication Sig Dispense Auth. Provider   amoxicillin -clavulanate (AUGMENTIN ) 875-125 MG tablet Take 1 tablet by mouth every 12 (twelve) hours. 14 tablet Rodriguez-Southworth, Laneka Mcgrory, PA-C   fluticasone  (FLONASE ) 50 MCG/ACT nasal spray Place 2 sprays into both nostrils daily. 15.8 mL Rodriguez-Southworth, Kyra, PA-C      PDMP not reviewed this encounter.   Lindi Kyra, PA-C 10/11/24 1840

## 2024-10-11 NOTE — ED Triage Notes (Addendum)
 Reports having sinusitis dx in Aug -- was unable to complete course of abx bc she lost it. C/O facial pain worsening and headache and occasional dizziness over past couple weeks. Pt also requesting STI & BV testing - c/o slight low abd discomfort, but denies vaginal discharge.

## 2024-10-11 NOTE — Discharge Instructions (Addendum)
 Do saline nose rinses twice a day for 5 days Do not use tap water, only boiled water that has been cooled down.  Do it twice a day  for 5-7 days, but avoid bed time.  Call your primary care doctor and make an appointment for 7-10 days.    Soak your ears with debrox for 10 minutes each side for one week and have your primary care doctor wash the wax out.

## 2024-10-11 NOTE — ED Notes (Signed)
 REported to provider Pt tol ear irrigation poorly

## 2024-10-12 ENCOUNTER — Ambulatory Visit (HOSPITAL_COMMUNITY): Payer: Self-pay

## 2024-10-12 LAB — CERVICOVAGINAL ANCILLARY ONLY
Bacterial Vaginitis (gardnerella): POSITIVE — AB
Candida Glabrata: NEGATIVE
Candida Vaginitis: NEGATIVE
Chlamydia: NEGATIVE
Comment: NEGATIVE
Comment: NEGATIVE
Comment: NEGATIVE
Comment: NEGATIVE
Comment: NEGATIVE
Comment: NORMAL
Neisseria Gonorrhea: NEGATIVE
Trichomonas: NEGATIVE

## 2024-10-12 MED ORDER — METRONIDAZOLE 500 MG PO TABS: 500.0000 mg | ORAL_TABLET | Freq: Two times a day (BID) | ORAL | 0 refills | Status: DC

## 2024-10-16 ENCOUNTER — Ambulatory Visit: Admitting: Cardiology

## 2024-10-23 ENCOUNTER — Ambulatory Visit: Admitting: Cardiology

## 2024-10-29 ENCOUNTER — Emergency Department (HOSPITAL_COMMUNITY)
Admission: EM | Admit: 2024-10-29 | Discharge: 2024-10-29 | Disposition: A | Discharge status: Home / Self Care | Attending: Emergency Medicine | Admitting: Emergency Medicine

## 2024-10-29 DIAGNOSIS — H1031 Unspecified acute conjunctivitis, right eye: Secondary | ICD-10-CM | POA: Diagnosis not present

## 2024-10-29 DIAGNOSIS — Z91040 Latex allergy status: Secondary | ICD-10-CM | POA: Insufficient documentation

## 2024-10-29 DIAGNOSIS — H5711 Ocular pain, right eye: Secondary | ICD-10-CM | POA: Diagnosis present

## 2024-10-29 MED ORDER — NAPROXEN 500 MG PO TABS: 500.0000 mg | ORAL_TABLET | Freq: Two times a day (BID) | ORAL | 0 refills | Status: DC

## 2024-10-29 MED ORDER — OXYCODONE-ACETAMINOPHEN 5-325 MG PO TABS
1.0000 | ORAL_TABLET | Freq: Once | ORAL | Status: AC
Start: 2024-10-29 — End: 2024-10-29
  Administered 2024-10-29: 1 via ORAL
  Filled 2024-10-29: qty 1

## 2024-10-29 MED ORDER — POLYMYXIN B-TRIMETHOPRIM 10000-0.1 UNIT/ML-% OP SOLN
1.0000 [drp] | Freq: Four times a day (QID) | OPHTHALMIC | Status: DC
  Administered 2024-10-29: 1 [drp] via OPHTHALMIC
  Filled 2024-10-29: qty 10

## 2024-10-29 NOTE — Discharge Instructions (Addendum)
 It was a pleasure taking part in your care.  As we discussed, please begin applying 1 drop to your right eye 4 times a day for the next 7 days.  Please take naproxen  twice a day for pain.  This was sent to your pharmacy.  Please follow-up with Dr. Fleeta, ophthalmology.  If you have worsening symptoms please return to the ED for further care.

## 2024-10-29 NOTE — ED Triage Notes (Signed)
 Pt states her children & family have had pink eye for the past week. They are better with medication but she now has symptoms. Reports acute pain in right eye starting today with green drainage.  States she has tried warm compresses & medication with no help. Reports she was seen for sinus infection & treated with antibiotics. Reports that has now resolved. Denies any injury.

## 2024-10-29 NOTE — ED Provider Notes (Signed)
 [place] EM[facility]PARTMENT AT [facility] Provider Note   CSN: [id number] Arrival date & time: 10/29/24  2207     Patient presents with: Conjunctivitis   Nicole Harris is a 28 y.o. female with history of depression, headaches, anxiety, ADHD.  Presents to ED complaining of discharge, swelling and pain to right eye.  Reports that symptoms been ongoing for the last 48 hours.  Reports that she has attempted to take medication and tried 1 compresses without relief.  Recently suffered from sinus infection, took antibiotics to completion.  States that multiple children of hers in the house have pinkeye currently and they recently finished antibiotics and improved.  She denies fevers, photophobia, headache, pain with movement of eyes.  She denies any contact or corrective lens use.   Conjunctivitis       Prior to Admission medications   Medication Sig Start Date End Date Taking? Authorizing Provider  naproxen  (NAPROSYN ) 500 MG tablet Take 1 tablet (500 mg total) by mouth 2 (two) times daily. 10/29/24  Yes Ruthell Lonni FALCON, PA-C  acetaminophen  (TYLENOL ) 325 MG tablet Take 2 tablets (650 mg total) by mouth every 4 (four) hours as needed (for pain scale < 4). 10/15/22   Mercado-Ortiz, Harlene RODES, DO  amoxicillin -clavulanate (AUGMENTIN ) 875-125 MG tablet Take 1 tablet by mouth every 12 (twelve) hours. 10/11/24   Rodriguez-Southworth, Sylvia, PA-C  benzocaine -Menthol  (DERMOPLAST) 20-0.5 % AERO Apply 1 Application topically as needed for irritation (perineal discomfort). 10/15/22   Mercado-Ortiz, Harlene RODES, DO  fluticasone  (FLONASE ) 50 MCG/ACT nasal spray Place 2 sprays into both nostrils daily. 10/11/24   Rodriguez-Southworth, Sylvia, PA-C  ibuprofen  (ADVIL ) 800 MG tablet Take 1 tablet (800 mg total) by mouth every 8 (eight) hours as needed. 10/31/22   Rudy Carlin DELENA, MD  metroNIDAZOLE  (FLAGYL ) 500 MG tablet Take 1 tablet (500 mg total) by mouth 2 (two) times daily. 10/12/24    Vonna Sharlet POUR, MD  senna-docusate (SENOKOT-S) 8.6-50 MG tablet Take 2 tablets by mouth daily. 10/15/22   Mercado-Ortiz, Harlene RODES, DO  witch hazel-glycerin  (TUCKS) pad Apply 1 Application topically as needed for hemorrhoids. 10/15/22   Mercado-Ortiz, Harlene RODES, DO    Allergies: Latex and Shellfish allergy    Review of Systems  Eyes:  Positive for discharge and redness.  All other systems reviewed and are negative.   Updated Vital Signs BP (!) 111/90 (BP Location: Right Arm)   Pulse 97   Temp 98.4 F (36.9 C) (Oral)   Resp 20   Ht 5' 4 (1.626 m)   Wt 73.4 kg   LMP 09/30/2024 (Exact Date)   SpO2 100%   BMI 27.77 kg/m   Physical Exam Vitals and nursing note reviewed.  Constitutional:      General: She is not in acute distress.    Appearance: She is well-developed.  HENT:     Head: Normocephalic and atraumatic.  Eyes:     Conjunctiva/sclera: Conjunctivae normal.     Comments: Injected right sclera.  Nonpainful EOMs.  No surrounding erythema.  Drainage present to medial canthus.  Cardiovascular:     Rate and Rhythm: Normal rate and regular rhythm.     Heart sounds: No murmur heard. Pulmonary:     Effort: Pulmonary effort is normal. No respiratory distress.     Breath sounds: Normal breath sounds.  Abdominal:     Palpations: Abdomen is soft.     Tenderness: There is no abdominal tenderness.  Musculoskeletal:  General: No swelling.     Cervical back: Neck supple.  Skin:    General: Skin is warm and dry.     Capillary Refill: Capillary refill takes less than 2 seconds.  Neurological:     Mental Status: She is alert.  Psychiatric:        Mood and Affect: Mood normal.     (all labs ordered are listed, but only abnormal results are displayed) Labs Reviewed - No data to display  EKG: None  Radiology: No results found.  Procedures   Medications Ordered in the ED  trimethoprim-polymyxin b (POLYTRIM) ophthalmic solution 1 drop (1 drop Right Eye Given  10/29/24 2325)  oxyCODONE -acetaminophen  (PERCOCET/ROXICET) 5-325 MG per tablet 1 tablet (1 tablet Oral Given 10/29/24 2300)    Medical Decision Making Risk Prescription drug management.   28 year old female presenting with eye drainage, redness and swelling to right eye for the last 24 to 48 hours.  Reports positive sick contacts at home by her children.  On exam, she has an injected right sclera.  She has nonpainful EOMs.  There is no surrounding erythema to her eye.  Hemodynamically stable.  Patient reports that she is quite certain that she has pinkeye.  She denies any prescription contact lens use.  She was given Polytrim drops here in the department prior to discharge and advised to take 1 drop to the right eye 4 times a day for the next 7 days.  She was given a dose of oxycodone  and advised that she will be sent home with naproxen  for pain.  She was referred to ophthalmology and she voiced understanding.  Advised to follow-up outpatient.  Given return precautions and she voiced understanding.  Stable to discharge.    Final diagnoses:  Acute conjunctivitis of right eye, unspecified acute conjunctivitis type    ED Discharge Orders          Ordered    naproxen  (NAPROSYN ) 500 MG tablet  2 times daily        10/29/24 2326               Ruthell Lonni FALCON, PA-C 10/29/24 2326    Carita Senior, MD 10/30/24 [phone]

## 2024-10-31 ENCOUNTER — Emergency Department (HOSPITAL_COMMUNITY): Admission: EM | Admit: 2024-10-31 | Discharge: 2024-10-31 | Disposition: A | Discharge status: Home / Self Care

## 2024-10-31 ENCOUNTER — Other Ambulatory Visit: Payer: Self-pay

## 2024-10-31 ENCOUNTER — Encounter (HOSPITAL_COMMUNITY): Payer: Self-pay | Admitting: Emergency Medicine

## 2024-10-31 DIAGNOSIS — H53141 Visual discomfort, right eye: Secondary | ICD-10-CM | POA: Insufficient documentation

## 2024-10-31 DIAGNOSIS — Z91040 Latex allergy status: Secondary | ICD-10-CM | POA: Diagnosis not present

## 2024-10-31 DIAGNOSIS — H5711 Ocular pain, right eye: Secondary | ICD-10-CM | POA: Diagnosis present

## 2024-10-31 DIAGNOSIS — H109 Unspecified conjunctivitis: Secondary | ICD-10-CM | POA: Insufficient documentation

## 2024-10-31 DIAGNOSIS — H1131 Conjunctival hemorrhage, right eye: Secondary | ICD-10-CM | POA: Diagnosis not present

## 2024-10-31 MED ORDER — OXYCODONE HCL 5 MG PO TABS: 5.0000 mg | ORAL_TABLET | Freq: Four times a day (QID) | ORAL | 0 refills | Status: DC | PRN

## 2024-10-31 NOTE — ED Notes (Signed)
 Visual Acuity- Left eye 20/13, Right eye 20/30

## 2024-10-31 NOTE — Discharge Instructions (Signed)
 Please read and follow all provided instructions.  Your diagnoses today include:  1. Pain of right eye   2. Subconjunctival hemorrhage of right eye   3. Conjunctivitis of right eye, unspecified conjunctivitis type     Tests performed today include: Visual acuity testing to check your vision Fluorescein dye examination to look for scratches on your eye Vital signs. See below for your results today.   Medications prescribed:  Oxycodone  - narcotic pain medication  DO NOT drive or perform any activities that require you to be awake and alert because this medicine can make you drowsy.   Take any prescribed medications only as directed.  Home care instructions:  Please continue using Polytrim antibiotic eyedrops as previously directed.  Follow-up instructions: Go to Dr. Milana office to be seen on Monday at 12:30pm.  If you cannot attend appointment, please call office to request another time.  Return instructions:  Please return to the Emergency Department if you experience worsening symptoms.  Please return immediately if you develop severe pain, pus drainage, new change in vision, or fever. Please return if you have any other emergent concerns.  Additional Information:  Your vital signs today were: BP 129/73   Pulse 92   Temp 98.2 F (36.8 C) (Oral)   Resp 18   LMP 09/30/2024 (Exact Date)   SpO2 100%  If your blood pressure (BP) was elevated above 135/85 this visit, please have this repeated by your doctor within one month. ---------------

## 2024-10-31 NOTE — ED Triage Notes (Signed)
 Pt seen here 2 days ago diagnosed with conjunctivitis.  Given anbx which she has been using.  No difference in pain and pt reports today that right eye is bleeding.

## 2024-10-31 NOTE — ED Provider Notes (Signed)
 [place] EMERGENCY DEPARTMENT AT [facility] Provider Note   CSN: [id number] Arrival date & time: 10/31/24  1739     Patient presents with: Eye Problem   Nicole Harris is a 28 y.o. female.   Patient presents to the emergency department for evaluation of right eye pain and redness.  Patient's symptoms started on around 10/27/2024.  She reports exposure to children who had pinkeye.  Patient developed right eye redness and crusting.  Patient was seen in the emergency department on 10/29/2024.  She was given Polytrim drops.  She states that she had significant pain at that visit.  Overall her symptoms have been mildly improving but she still has a lot of pain and light sensitivity.  She saw blood on the eye today, prompting recheck.  No fevers.  Minimal swelling around the eye.  No significant vision change or loss, but she has trouble opening her eye.  She denies any scratches to the eye.  No contact lens use or previous ocular surgical history.       Prior to Admission medications   Medication Sig Start Date End Date Taking? Authorizing Provider  acetaminophen  (TYLENOL ) 325 MG tablet Take 2 tablets (650 mg total) by mouth every 4 (four) hours as needed (for pain scale < 4). 10/15/22   Mercado-Ortiz, Harlene RODES, DO  amoxicillin -clavulanate (AUGMENTIN ) 875-125 MG tablet Take 1 tablet by mouth every 12 (twelve) hours. 10/11/24   Rodriguez-Southworth, Sylvia, PA-C  benzocaine -Menthol  (DERMOPLAST) 20-0.5 % AERO Apply 1 Application topically as needed for irritation (perineal discomfort). 10/15/22   Mercado-Ortiz, Harlene RODES, DO  fluticasone  (FLONASE ) 50 MCG/ACT nasal spray Place 2 sprays into both nostrils daily. 10/11/24   Rodriguez-Southworth, Sylvia, PA-C  ibuprofen  (ADVIL ) 800 MG tablet Take 1 tablet (800 mg total) by mouth every 8 (eight) hours as needed. 10/31/22   Rudy Carlin DELENA, MD  metroNIDAZOLE  (FLAGYL ) 500 MG tablet Take 1 tablet (500 mg total) by mouth 2 (two) times  daily. 10/12/24   Vonna Sharlet POUR, MD  naproxen  (NAPROSYN ) 500 MG tablet Take 1 tablet (500 mg total) by mouth 2 (two) times daily. 10/29/24   Ruthell Lonni FALCON, PA-C  senna-docusate (SENOKOT-S) 8.6-50 MG tablet Take 2 tablets by mouth daily. 10/15/22   Mercado-Ortiz, Harlene RODES, DO  witch hazel-glycerin  (TUCKS) pad Apply 1 Application topically as needed for hemorrhoids. 10/15/22   Mercado-Ortiz, Harlene RODES, DO    Allergies: Latex and Shellfish allergy    Review of Systems  Updated Vital Signs BP 128/68 (BP Location: Right Arm)   Pulse 98   Temp 98.2 F (36.8 C) (Oral)   Resp 18   LMP 09/30/2024 (Exact Date)   SpO2 100%   Physical Exam Vitals and nursing note reviewed.  Constitutional:      Appearance: She is well-developed.  HENT:     Head: Normocephalic and atraumatic.  Eyes:     Extraocular Movements: Extraocular movements intact.     Conjunctiva/sclera:     Right eye: Right conjunctiva is injected. Hemorrhage present. No chemosis or exudate.    Left eye: Left conjunctiva is not injected. No chemosis.    Pupils: Pupils are equal, round, and reactive to light.     Right eye: Pupil is round, reactive and not sluggish. Fluorescein uptake present.     Left eye: Pupil is round, reactive and not sluggish.     Slit lamp exam:    Right eye: Photophobia present.     Left eye: No photophobia.  Comments: Pain resolved after administration of tetracaine, returned when it wears off.   There is a crescent shaped area of fluorescein uptake over the superior half of the cornea.  Patient can read my fingers easily held 1 to 2 feet in front of her right eye.  Pulmonary:     Effort: No respiratory distress.  Musculoskeletal:     Cervical back: Normal range of motion and neck supple.  Skin:    General: Skin is warm and dry.  Neurological:     Mental Status: She is alert.      (all labs ordered are listed, but only abnormal results are displayed) Labs Reviewed - No data  to display  EKG: None  Radiology: No results found.   Procedures   Medications Ordered in the ED - No data to display  ED Course  Patient seen and examined. History obtained directly from patient.   Labs/EKG: None ordered  Imaging: None ordered  Medications/Fluids: Ordered: Fluorescein, tetracaine  Most recent vital signs reviewed and are as follows: BP 128/68 (BP Location: Right Arm)   Pulse 98   Temp 98.2 F (36.8 C) (Oral)   Resp 18   LMP 09/30/2024 (Exact Date)   SpO2 100%   Initial impression: Red painful right eye.  Currently on Polytrim for conjunctivitis.  Given pain out of proportion to what would be expected for conjunctivitis, will perform fluorescein exam.  7:51 PM Reassessment performed. Patient appears stable, pain controlled with tetracaine drops, otherwise pain returns.  Discussed that she cannot take these home.  Discussed case with on-call ophthalmology Dr. Marcey.  Recommend patient come to the office at 12:30 PM on Monday (today is Saturday).    Visual Acuity- Left eye 20/13, Right eye 20/30   Reviewed pertinent lab work and imaging with patient at bedside. Questions answered.   Most current vital signs reviewed and are as follows: BP 129/73   Pulse 92   Temp 98.2 F (36.8 C) (Oral)   Resp 18   LMP 09/30/2024 (Exact Date)   SpO2 100%   Plan: Discharge to home.   Prescriptions written for: Oxycodone  # 5 tablets  Patient would like to try Tylenol  and ibuprofen  due to family history of opiate addiction.  We discussed safe use of medication if needed.  Patient counseled on use of narcotic pain medications. Counseled not to combine these medications with others containing tylenol . Urged not to drink alcohol, drive, or perform any other activities that requires focus while taking these medications. The patient verbalizes understanding and agrees with the plan.  Other home care instructions discussed: Avoidance of symptoms and make pain  worse.  ED return instructions discussed: Loss of vision, swelling around the eye, fever  Follow-up instructions discussed: Patient encouraged to follow-up with ophthalmology as planned in 2 days.                                   Medical Decision Making  Patient with right eye pain.  Currently being treated for conjunctivitis however pain and photophobia out of proportion to what would be expected for conjunctivitis.  Pain resolved with tetracaine.  No evidence of iritis.  Discussed with on-call ophthalmology.  Agrees with continuing antibiotics.  Patient to follow-up with them on Monday.  There are certain types of uncommon conjunctivitis which can cause subconjunctival hemorrhage and sometimes keratitis.  These can have a prolonged course.  Patient does not give  a history which would be consistent with a corneal abrasion.  No foreign bodies noted. No surrounding erythema, swelling, vision changes/loss suspicious for orbital or periorbital cellulitis. No signs of iritis.  No glaucoma. No symptoms of retinal detachment.       Final diagnoses:  Pain of right eye  Subconjunctival hemorrhage of right eye  Conjunctivitis of right eye, unspecified conjunctivitis type    ED Discharge Orders          Ordered    oxyCODONE  (OXY IR/ROXICODONE ) 5 MG immediate release tablet  Every 6 hours PRN        10/31/24 1952               Desiderio Chew, PA-C 10/31/24 1958    Ula Prentice SAUNDERS, MD 10/31/24 2340

## 2024-10-31 NOTE — ED Notes (Signed)
 Patient refuses visual acuity test at this time due to pain.

## 2024-11-01 ENCOUNTER — Other Ambulatory Visit: Payer: Self-pay

## 2024-11-01 ENCOUNTER — Emergency Department (HOSPITAL_COMMUNITY)
Admission: EM | Admit: 2024-11-01 | Discharge: 2024-11-01 | Disposition: A | Discharge status: Home / Self Care | Attending: Emergency Medicine | Admitting: Emergency Medicine

## 2024-11-01 ENCOUNTER — Encounter (HOSPITAL_COMMUNITY): Payer: Self-pay | Admitting: Emergency Medicine

## 2024-11-01 DIAGNOSIS — R202 Paresthesia of skin: Secondary | ICD-10-CM | POA: Diagnosis present

## 2024-11-01 DIAGNOSIS — Z91040 Latex allergy status: Secondary | ICD-10-CM | POA: Insufficient documentation

## 2024-11-01 NOTE — ED Triage Notes (Signed)
 Patient c/o tingling sensation on the right side of her face, extending down to her right ear started last night, after using eye drops prescribed to her yesterday. Hx Conjunctivitis 2 days ago.

## 2024-11-01 NOTE — ED Provider Notes (Signed)
 [place] EMERGENCY DEPARTMENT AT [facility] Provider Note  CSN: [id number] Arrival date & time: 11/01/24 2107  Chief Complaint(s) Tingling  HPI Nicole Harris is a 28 y.o. female currently being treated for corneal abrasion and conjuctivitis with polymicin is here for right face hypersensitivity.  Patient reports she has had 2 episodes of this, 1 yesterday and 1 just prior to arrival.  These lasted for approximately an hour before self resolving.  Reported that the right side of her face felt very tender to touch.  She denied any trauma.  Reports that she has been applying warm compresses to her eyes to relieve swelling.  These do not seem to be related to eyedrop usage.  The history is provided by the patient.    Past Medical History Past Medical History:  Diagnosis Date   ADHD (attention deficit hyperactivity disorder)    Anemia    Anxiety    Bipolar disorder (HCC)    Depression    Headache    Nausea/vomiting in pregnancy 07/10/2018   Stress incontinence in female 02/16/2021   Patient Active Problem List   Diagnosis Date Noted   Anemia of mother in pregnancy, postpartum condition 10/14/2022   [redacted] weeks gestation of pregnancy 10/13/2022   Sciatica of right side 07/23/2022   Low grade squamous intraepithelial lesion (LGSIL) on cervical Pap smear 05/09/2022   Regularly irregular pulse rhythym 04/28/2022   Supervision of other normal pregnancy, antepartum 04/16/2022   Vaginal delivery 02/07/2021   History of depression 10/19/2020   Hx of delivery by vacuum extraction, currently pregnant 09/14/2020   Nausea and vomiting in pregnancy 07/10/2018   Home Medication(s) Prior to Admission medications   Medication Sig Start Date End Date Taking? Authorizing Provider  acetaminophen  (TYLENOL ) 325 MG tablet Take 2 tablets (650 mg total) by mouth every 4 (four) hours as needed (for pain scale < 4). 10/15/22   Mercado-Ortiz, Harlene RODES, DO  amoxicillin -clavulanate  (AUGMENTIN ) 875-125 MG tablet Take 1 tablet by mouth every 12 (twelve) hours. 10/11/24   Rodriguez-Southworth, Sylvia, PA-C  benzocaine -Menthol  (DERMOPLAST) 20-0.5 % AERO Apply 1 Application topically as needed for irritation (perineal discomfort). 10/15/22   Mercado-Ortiz, Harlene RODES, DO  fluticasone  (FLONASE ) 50 MCG/ACT nasal spray Place 2 sprays into both nostrils daily. 10/11/24   Rodriguez-Southworth, Sylvia, PA-C  ibuprofen  (ADVIL ) 800 MG tablet Take 1 tablet (800 mg total) by mouth every 8 (eight) hours as needed. 10/31/22   Rudy Carlin DELENA, MD  metroNIDAZOLE  (FLAGYL ) 500 MG tablet Take 1 tablet (500 mg total) by mouth 2 (two) times daily. 10/12/24   Vonna Sharlet POUR, MD  naproxen  (NAPROSYN ) 500 MG tablet Take 1 tablet (500 mg total) by mouth 2 (two) times daily. 10/29/24   Ruthell Lonni FALCON, PA-C  oxyCODONE  (OXY IR/ROXICODONE ) 5 MG immediate release tablet Take 1 tablet (5 mg total) by mouth every 6 (six) hours as needed for severe pain (pain score 7-10). 10/31/24   Desiderio Chew, PA-C  senna-docusate (SENOKOT-S) 8.6-50 MG tablet Take 2 tablets by mouth daily. 10/15/22   Mercado-Ortiz, Harlene RODES, DO  witch hazel-glycerin  (TUCKS) pad Apply 1 Application topically as needed for hemorrhoids. 10/15/22   Mercado-Ortiz, Harlene RODES, DO  Allergies Latex and Shellfish allergy  Review of Systems Review of Systems As noted in HPI  Physical Exam Vital Signs  I have reviewed the triage vital signs BP 116/78   Pulse 93   Temp 99.2 F (37.3 C) (Oral)   Resp 18   LMP 09/30/2024 (Exact Date)   SpO2 100%   Physical Exam Vitals reviewed.  Constitutional:      General: She is not in acute distress.    Appearance: She is well-developed. She is not diaphoretic.  HENT:     Head: Normocephalic and atraumatic.     Comments: Fine papular rash to central forehead     Right Ear: External ear normal.     Left Ear: External ear normal.     Nose: Nose normal.  Eyes:     General: No scleral icterus.    Conjunctiva/sclera:     Right eye: Right conjunctiva is injected. Hemorrhage present.  Neck:     Trachea: Phonation normal.  Cardiovascular:     Rate and Rhythm: Normal rate and regular rhythm.  Pulmonary:     Effort: Pulmonary effort is normal. No respiratory distress.     Breath sounds: No stridor.  Abdominal:     General: There is no distension.  Musculoskeletal:        General: Normal range of motion.     Cervical back: Normal range of motion.  Neurological:     Mental Status: She is alert and oriented to person, place, and time.  Psychiatric:        Behavior: Behavior normal.     ED Results and Treatments Labs (all labs ordered are listed, but only abnormal results are displayed) Labs Reviewed - No data to display                                                                                                                       EKG  EKG Interpretation Date/Time:    Ventricular Rate:    PR Interval:    QRS Duration:    QT Interval:    QTC Calculation:   R Axis:      Text Interpretation:         Radiology No results found.  Medications Ordered in ED Medications - No data to display Procedures Procedures  (including critical care time) Medical Decision Making / ED Course   Medical Decision Making   Hypersensitivity paresthesia. Intermittent. Resolved. No focal deficits.  Rash likely unrelated to eye drops given location. Likely related to warm compresses. Monitoring recommended.      Final Clinical Impression(s) / ED Diagnoses Final diagnoses:  Paresthesia   The patient appears reasonably screened and/or stabilized for discharge and I doubt any other medical condition or other [facility] requiring further screening, evaluation, or treatment in the ED at this time. I have discussed the findings, Dx and Tx plan with the  patient/family who expressed understanding and agree(s) with the plan. Discharge instructions discussed at length. The patient/family was given  strict return precautions who verbalized understanding of the instructions. No further questions at time of discharge.  Disposition: Discharge  Condition: Good  ED Discharge Orders     None        Follow Up: Primary care provider  Call  as needed     This chart was dictated using voice recognition software.  Despite best efforts to proofread,  errors can occur which can change the documentation meaning.    Trine Raynell Moder, MD 11/01/24 [phone]

## 2024-11-28 ENCOUNTER — Encounter (HOSPITAL_COMMUNITY): Payer: Self-pay | Admitting: Emergency Medicine

## 2024-11-28 ENCOUNTER — Other Ambulatory Visit: Payer: Self-pay

## 2024-11-28 ENCOUNTER — Ambulatory Visit (HOSPITAL_COMMUNITY)
Admission: EM | Admit: 2024-11-28 | Discharge: 2024-11-28 | Disposition: A | Discharge status: Home / Self Care | Attending: Family Medicine | Admitting: Family Medicine

## 2024-11-28 DIAGNOSIS — N760 Acute vaginitis: Secondary | ICD-10-CM | POA: Insufficient documentation

## 2024-11-28 MED ORDER — METRONIDAZOLE 500 MG PO TABS: 500.0000 mg | ORAL_TABLET | Freq: Two times a day (BID) | ORAL | 0 refills | Status: AC

## 2024-11-28 NOTE — ED Provider Notes (Signed)
 [facility]    CSN: [id number] Arrival date & time: 11/28/24  1328      History   Chief Complaint Chief Complaint  Patient presents with   Vaginal Discharge    HPI Nicole Harris is a 28 y.o. female.    Vaginal Discharge  Here for vaginal discharge that began about 2 days ago.  No odor and no itching.  She thinks it might be BV.  No abdominal pain No fever  She does have a slight cough in the few days ago was experiencing some sore throat.  That has resolved but she would like her throat checked since at 1 point she saw some white exudate.  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 05/09/2022   Regularly irregular pulse rhythym 04/28/2022   Supervision of other normal pregnancy, antepartum 04/16/2022   Vaginal delivery 02/07/2021   History of depression 10/19/2020   Hx of delivery by vacuum extraction, currently pregnant 09/14/2020   Nausea and vomiting in pregnancy 07/10/2018    Past Surgical History:  Procedure Laterality Date   ADENOIDECTOMY     TONGUE SURGERY     TONSILLECTOMY      OB History     Gravida  4   Para  3   Term  3   Preterm      AB  1   Living  3      SAB      IAB  1   Ectopic      Multiple  0   Live Births  3            Home Medications    Prior to Admission medications   Medication Sig Start Date End Date Taking? Authorizing Provider   cetirizine  (ZYRTEC ) 10 MG tablet Take 10 mg by mouth daily.   Yes [provider]  metroNIDAZOLE  (FLAGYL ) 500 MG tablet Take 1 tablet (500 mg total) by mouth 2 (two) times daily for 7 days. 11/28/24 12/05/24 Yes Jacquelinne Speak K, MD  Multiple Vitamin (MULTIVITAMIN) tablet Take 1 tablet by mouth daily.   Yes [provider]  acetaminophen  (TYLENOL ) 325 MG tablet Take 2 tablets (650 mg total) by mouth every 4 (four) hours as needed (for pain scale < 4). 10/15/22   Mercado-Ortiz, Harlene RODES, DO  benzocaine -Menthol  (DERMOPLAST) 20-0.5 % AERO Apply 1 Application topically as needed for irritation (perineal discomfort). 10/15/22   Mercado-Ortiz, Harlene RODES, DO  ibuprofen  (ADVIL ) 800 MG tablet Take 1 tablet (800 mg total) by mouth every 8 (eight) hours as needed. 10/31/22   Rudy Carlin DELENA, MD  senna-docusate (SENOKOT-S) 8.6-50 MG tablet Take 2 tablets by mouth daily. 10/15/22   Mercado-Ortiz, Harlene RODES, DO  witch hazel-glycerin  (TUCKS) pad Apply 1 Application topically as needed for hemorrhoids. 10/15/22   Mercado-Ortiz, Harlene RODES, DO    Family History Family History  Problem Relation Age  of Onset   Drug abuse Mother    Healthy Father    Cancer Maternal Grandmother    Thyroid disease Maternal Grandmother    Ovarian cancer Maternal Grandmother    Diabetes Paternal Grandmother    Hypertension Paternal Grandfather     Social History Social History   Tobacco Use   Smoking status: Former    Types: Cigarettes    Passive exposure: Past   Smokeless tobacco: Never  Vaping Use   Vaping status: Some Days  Substance Use Topics   Alcohol use: Not Currently    Comment: occasionally   Drug use: Not Currently    Types: Marijuana     Allergies   Latex and Shellfish allergy   Review of Systems Review of Systems  Genitourinary:  Positive for vaginal discharge.     Physical Exam Triage Vital Signs ED Triage Vitals  Encounter Vitals Group     BP 11/28/24 1446 124/79      Girls Systolic BP Percentile --      Girls Diastolic BP Percentile --      Boys Systolic BP Percentile --      Boys Diastolic BP Percentile --      Pulse Rate 11/28/24 1446 89     Resp 11/28/24 1446 18     Temp 11/28/24 1446 98.2 F (36.8 C)     Temp Source 11/28/24 1446 Oral     SpO2 11/28/24 1446 97 %     Weight --      Height --      Head Circumference --      Peak Flow --      Pain Score 11/28/24 1442 0     Pain Loc --      Pain Education --      Exclude from Growth Chart --    No data found.  Updated Vital Signs BP 124/79 (BP Location: Right Arm)   Pulse 89   Temp 98.2 F (36.8 C) (Oral)   Resp 18   LMP 11/04/2024 (Approximate)   SpO2 97%   Visual Acuity Right Eye Distance:   Left Eye Distance:   Bilateral Distance:    Right Eye Near:   Left Eye Near:    Bilateral Near:     Physical Exam Vitals reviewed.  Constitutional:      General: She is not in acute distress.    Appearance: She is not ill-appearing, toxic-appearing or diaphoretic.  HENT:     Nose: Nose normal.     Mouth/Throat:     Mouth: Mucous membranes are moist.     Pharynx: No oropharyngeal exudate.     Comments: Some mild erythema is noted of the oropharynx, but no exudate and no tonsillar hypertrophy. Eyes:     Extraocular Movements: Extraocular movements intact.     Conjunctiva/sclera: Conjunctivae normal.     Pupils: Pupils are equal, round, and reactive to light.  Cardiovascular:     Rate and Rhythm: Normal rate and regular rhythm.     Heart sounds: No murmur heard. Pulmonary:     Effort: Pulmonary effort is normal. No respiratory distress.     Breath sounds: No wheezing, rhonchi or rales.  Chest:     Chest wall: No tenderness.  Musculoskeletal:     Cervical back: Neck supple.  Lymphadenopathy:     Cervical: No cervical adenopathy.  Skin:    Capillary Refill: Capillary refill takes less than 2 seconds.     Coloration: Skin is not jaundiced  or pale.  Neurological:     General:  No focal deficit present.     Mental Status: She is alert and oriented to person, place, and time.  Psychiatric:        Behavior: Behavior normal.      UC Treatments / Results  Labs (all labs ordered are listed, but only abnormal results are displayed) Labs Reviewed  CERVICOVAGINAL ANCILLARY ONLY    EKG   Radiology No results found.  Procedures Procedures (including critical care time)  Medications Ordered in UC Medications - No data to display  Initial Impression / Assessment and Plan / UC Course  I have reviewed the triage vital signs and the nursing notes.  Pertinent labs & imaging results that were available during my care of the patient were reviewed by me and considered in my medical decision making (see chart for details).     Vaginal self swab is done, and we will notify of any positives on that and treat per protocol.  The throat symptoms are improved, so no testing or treatment needed. Final Clinical Impressions(s) / UC Diagnoses   Final diagnoses:  Acute vaginitis     Discharge Instructions      Take metronidazole  500 mg--1 tablet 2 times daily for 7 days.  Avoid drinking alcohol within 72 hours of taking this medication  Take metronidazole  500 mg--1 tablet 2 times daily for 7 days.  Avoid drinking alcohol within 72 hours of taking this medication       ED Prescriptions     Medication Sig Dispense Auth. Provider   metroNIDAZOLE  (FLAGYL ) 500 MG tablet Take 1 tablet (500 mg total) by mouth 2 (two) times daily for 7 days. 14 tablet Lynsie Mcwatters K, MD      PDMP not reviewed this encounter.   Vonna Sharlet POUR, MD 11/28/24 [phone]

## 2024-11-28 NOTE — ED Triage Notes (Signed)
 Reports a thicker discharger, no odor.  Patient thinks it is related to fiance has been changing soap due to his skin conditions.  Patient reports she has recurrent bv issues  Complains of a slight cough.  Wanting to have tonsils looked at today

## 2024-11-28 NOTE — Discharge Instructions (Addendum)
 Take metronidazole  500 mg--1 tablet 2 times daily for 7 days.  Avoid drinking alcohol within 72 hours of taking this medication  Take metronidazole  500 mg--1 tablet 2 times daily for 7 days.  Avoid drinking alcohol within 72 hours of taking this medication

## 2024-11-30 LAB — CERVICOVAGINAL ANCILLARY ONLY
Bacterial Vaginitis (gardnerella): POSITIVE — AB
Candida Glabrata: NEGATIVE
Candida Vaginitis: NEGATIVE
Chlamydia: NEGATIVE
Comment: NEGATIVE
Comment: NEGATIVE
Comment: NEGATIVE
Comment: NEGATIVE
Comment: NEGATIVE
Comment: NORMAL
Neisseria Gonorrhea: NEGATIVE
Trichomonas: NEGATIVE

## 2024-12-01 ENCOUNTER — Ambulatory Visit (HOSPITAL_COMMUNITY): Payer: Self-pay

## 2025-01-03 ENCOUNTER — Other Ambulatory Visit: Payer: Self-pay

## 2025-01-03 ENCOUNTER — Encounter (HOSPITAL_COMMUNITY): Payer: Self-pay | Admitting: Pharmacy Technician

## 2025-01-03 ENCOUNTER — Emergency Department (HOSPITAL_COMMUNITY)
Admission: EM | Admit: 2025-01-03 | Discharge: 2025-01-03 | Disposition: A | Discharge status: Home / Self Care | Attending: Emergency Medicine | Admitting: Emergency Medicine

## 2025-01-03 DIAGNOSIS — R0981 Nasal congestion: Secondary | ICD-10-CM | POA: Insufficient documentation

## 2025-01-03 DIAGNOSIS — R059 Cough, unspecified: Secondary | ICD-10-CM | POA: Insufficient documentation

## 2025-01-03 DIAGNOSIS — R531 Weakness: Secondary | ICD-10-CM | POA: Insufficient documentation

## 2025-01-03 DIAGNOSIS — Z91040 Latex allergy status: Secondary | ICD-10-CM | POA: Insufficient documentation

## 2025-01-03 DIAGNOSIS — Z733 Stress, not elsewhere classified: Secondary | ICD-10-CM | POA: Insufficient documentation

## 2025-01-03 DIAGNOSIS — M79652 Pain in left thigh: Secondary | ICD-10-CM | POA: Insufficient documentation

## 2025-01-03 LAB — CBC
HCT: 35.7 % — ABNORMAL LOW (ref 36.0–46.0)
Hemoglobin: 11 g/dL — ABNORMAL LOW (ref 12.0–15.0)
MCH: 25.2 pg — ABNORMAL LOW (ref 26.0–34.0)
MCHC: 30.8 g/dL (ref 30.0–36.0)
MCV: 81.7 fL (ref 80.0–100.0)
Platelets: 256 K/uL (ref 150–400)
RBC: 4.37 MIL/uL (ref 3.87–5.11)
RDW: 14.5 % (ref 11.5–15.5)
WBC: 4.9 K/uL (ref 4.0–10.5)
nRBC: 0 % (ref 0.0–0.2)

## 2025-01-03 LAB — COMPREHENSIVE METABOLIC PANEL WITH GFR
ALT: 50 U/L — ABNORMAL HIGH (ref 0–44)
AST: 38 U/L (ref 15–41)
Albumin: 4 g/dL (ref 3.5–5.0)
Alkaline Phosphatase: 71 U/L (ref 38–126)
Anion gap: 11 (ref 5–15)
BUN: 8 mg/dL (ref 6–20)
CO2: 22 mmol/L (ref 22–32)
Calcium: 9.4 mg/dL (ref 8.9–10.3)
Chloride: 105 mmol/L (ref 98–111)
Creatinine, Ser: 0.57 mg/dL (ref 0.44–1.00)
GFR, Estimated: >60 mL/min
Glucose, Bld: 111 mg/dL — ABNORMAL HIGH (ref 70–99)
Potassium: 4.1 mmol/L (ref 3.5–5.1)
Sodium: 137 mmol/L (ref 135–145)
Total Bilirubin: 0.3 mg/dL (ref 0.0–1.2)
Total Protein: 7.3 g/dL (ref 6.5–8.1)

## 2025-01-03 LAB — URINALYSIS, ROUTINE W REFLEX MICROSCOPIC
Bacteria, UA: NONE SEEN
Bilirubin Urine: NEGATIVE
Glucose, UA: NEGATIVE mg/dL
Hgb urine dipstick: NEGATIVE
Ketones, ur: NEGATIVE mg/dL
Nitrite: NEGATIVE
Protein, ur: NEGATIVE mg/dL
Specific Gravity, Urine: 1.015 (ref 1.005–1.030)
pH: 7 (ref 5.0–8.0)

## 2025-01-03 LAB — HCG, SERUM, QUALITATIVE: Preg, Serum: NEGATIVE

## 2025-01-03 NOTE — ED Triage Notes (Signed)
 Pt here POV with generalized weakness. Started in her arms and is now everywhere. States L leg has been feeling heavy and hurting for the last 2 days. Overall not feeling well. Reports always feeling tired.

## 2025-01-03 NOTE — ED Provider Notes (Signed)
 " [place] [facility] AT [facility] Provider Note   CSN: [id number] Arrival date & time: 01/03/25  1348     Patient presents with: Weakness   Nicole Harris is a 29 y.o. female.   The history is provided by the patient and medical records. No language interpreter was used.  Weakness    29 year old female with history of bipolar disorder, anxiety, anemia, ADHD, presenting complaining of generalized weakness.  Patient states for the past 3 to 4 days she has noticed some weakness to her body.  States she first noticed some pain to left thigh but described more as like a slightly uncomfortable sensation with some weakness.  She then feel the same weakness sensation to her other extremities and just felt more tired.  She also has some congestion and cough that has been ongoing for several weeks and attributed to her children been sick with RSV.  She does not endorse any fever or chills no headache no sore throat no ear pain no shortness of breath no chest pain no abdominal pain no urinary symptoms and no rash.  She has been eating and drinking fine, does endorse increasing stress at home.  States she does not have a PCP and would like to have her vitamin level checked  Prior to Admission medications  Medication Sig Start Date End Date Taking? Authorizing Provider  acetaminophen  (TYLENOL ) 325 MG tablet Take 2 tablets (650 mg total) by mouth every 4 (four) hours as needed (for pain scale < 4). 10/15/22   Mercado-Ortiz, Harlene RODES, DO  benzocaine -Menthol  (DERMOPLAST) 20-0.5 % AERO Apply 1 Application topically as needed for irritation (perineal discomfort). 10/15/22   Mercado-Ortiz, Harlene RODES, DO  cetirizine  (ZYRTEC ) 10 MG tablet Take 10 mg by mouth daily.    [provider]  ibuprofen  (ADVIL ) 800 MG tablet Take 1 tablet (800 mg total) by mouth every 8 (eight) hours as needed. 10/31/22   Rudy Carlin DELENA, MD  Multiple Vitamin (MULTIVITAMIN) tablet Take 1 tablet by  mouth daily.    [provider]  senna-docusate (SENOKOT-S) 8.6-50 MG tablet Take 2 tablets by mouth daily. 10/15/22   Mercado-Ortiz, Harlene RODES, DO  witch hazel-glycerin  (TUCKS) pad Apply 1 Application topically as needed for hemorrhoids. 10/15/22   Mercado-Ortiz, Harlene RODES, DO    Allergies: Latex and Shellfish allergy    Review of Systems  Neurological:  Positive for weakness.  All other systems reviewed and are negative.   Updated Vital Signs BP (!) 129/57   Pulse (!) 101   Temp 98.9 F (37.2 C) (Oral)   Resp 16   SpO2 100%   Physical Exam Vitals and nursing note reviewed.  Constitutional:      General: She is not in acute distress.    Appearance: She is well-developed.  HENT:     Head: Atraumatic.  Eyes:     Conjunctiva/sclera: Conjunctivae normal.  Cardiovascular:     Rate and Rhythm: Normal rate and regular rhythm.     Pulses: Normal pulses.     Heart sounds: Normal heart sounds.  Pulmonary:     Effort: Pulmonary effort is normal.     Breath sounds: No wheezing, rhonchi or rales.  Abdominal:     Palpations: Abdomen is soft.     Tenderness: There is no abdominal tenderness.  Musculoskeletal:        General: No tenderness.     Cervical back: Neck supple.     Comments: 5 out of 5 strength  in all 4 extremities.  Normal steady gait.  Leg compartments are soft.  Skin:    Findings: No rash.  Neurological:     Mental Status: She is alert.  Psychiatric:        Mood and Affect: Mood normal.     (all labs ordered are listed, but only abnormal results are displayed) Labs Reviewed  COMPREHENSIVE METABOLIC PANEL WITH GFR - Abnormal; Notable for the following components:      Result Value   Glucose, Bld 111 (*)    ALT 50 (*)    All other components within normal limits  CBC - Abnormal; Notable for the following components:   Hemoglobin 11.0 (*)    HCT 35.7 (*)    MCH 25.2 (*)    All other components within normal limits  URINALYSIS, ROUTINE W REFLEX  MICROSCOPIC - Abnormal; Notable for the following components:   APPearance HAZY (*)    Leukocytes,Ua SMALL (*)    All other components within normal limits  HCG, SERUM, QUALITATIVE    EKG: None  Radiology: No results found.   Procedures   Medications Ordered in the ED - No data to display                                  Medical Decision Making Amount and/or Complexity of Data Reviewed Labs: ordered.   BP (!) 129/57   Pulse (!) 101   Temp 98.9 F (37.2 C) (Oral)   Resp 16   SpO2 100%   54:28 PM  29 year old female with history of bipolar disorder, anxiety, anemia, ADHD, presenting complaining of generalized weakness.  Patient states for the past 3 to 4 days she has noticed some weakness to her body.  States she first noticed some pain to left thigh but described more as like a slightly uncomfortable sensation with some weakness.  She then feel the same weakness sensation to her other extremities and just felt more tired.  She also has some congestion and cough that has been ongoing for several weeks and attributed to her children been sick with RSV.  She does not endorse any fever or chills no headache no sore throat no ear pain no shortness of breath no chest pain no abdominal pain no urinary symptoms and no rash.  She has been eating and drinking fine, does endorse increasing stress at home.  States she does not have a PCP and would like to have her vitamin level checked  On exam patient is resting comfortably in no acute discomfort.   heart with normal rate and rhythm, lungs are clear abdomen is soft nontender 5 out of 5 strength in all 4 extremities, sensation is intact throughout, normal gait.  Other vital signs shows heart rate of 101, her symptoms is not suggestive of PE.  Labs obtained independent viewed inter by me and fortunately no metabolic derangement or electrolytes abnormalities.  At this time no acute emergent medical condition identified.  Encourage patient to  follow-up with primary care provider for outpatient evaluation and management.  Return precaution given.  Doubt stroke, MS, PE, or other acuter medical emergency.      Final diagnoses:  Generalized weakness    ED Discharge Orders     None          Nivia Colon, DEVONNA 01/03/25 1620    Garrick Charleston, MD 01/03/25 2258  "

## 2025-01-03 NOTE — Discharge Instructions (Addendum)
 You have been evaluated for your symptoms.  Fortunately no concerning findings were noted on today's exam.  Your blood work overall reassuring.  If you develop significant shortness of breath, coughing up blood, or having trouble breathing please return.  Use resources below to find a primary care provider.

## 2025-01-04 ENCOUNTER — Ambulatory Visit: Payer: Self-pay

## 2025-01-04 NOTE — Telephone Encounter (Signed)
 FYI Only or Action Required?: FYI only for provider: appointment scheduled on New pt appointment scheduled 01/19/25.  Patient was last seen in primary care on Not established with [facility].  Called Nurse Triage reporting Fatigue.  Symptoms began several days ago.  Interventions attempted: Rest, hydration, or home remedies.  Symptoms are: gradually worsening.  Triage Disposition: See PCP Within 2 Weeks  Patient/caregiver understands and will follow disposition?: Yes    3-4 days ago onset of weakness/fatigue in legs. Left leg more weak than the right yesterday, hx of alternating weakness. Sometimes feels it in her arms and the back of her head. Weakness fluctuates.  Feeling exhausted.  Denies numbness but feels a vibration/tingling like sensation in her legs. Has been ongoing for a year but has gotten worse in the past week. No SOB or CP or changes to speech/vision. Able to walk around independently. Also reports hx of irregular heartbeat where heart intermittently races. Saw a cardiologist when she was pregnant. Has US  which was negative. Was dx with extra heart beats in her lower chamber per pt. Went to ED yesterday for above symptoms and was dx with generalized weakness. Work up negative and told to f/u with PCP.  Also reports glucose has been persistently in the 100-130's for a little while when she gets her lab work drawn. Has had cough with congestion for several weeks since November as well. Kids recently had RSV. Went to the ED at that time and was told she had a sinus infection. Not established with [facility] PCP. Scheduled soonest appt on 1/20. Advised UC or ED for worsening symptoms until then.     Copied from CRM [id number]. Topic: Clinical - Red Word Triage >> Jan 04, 2025  1:56 PM Berneda FALCON wrote: Red Word that prompted transfer to Nurse Triage: Patient states she was seen at [facility] yesterday and is still feeling weakness and pain today. She said she has a you need to sit down  type of feeling. She feels out of breath, heart racing.  Also noted that her glucose has been slightly elevated over the past couple of years (above 100) Reason for Disposition  Fatigue is a chronic symptom (recurrent or ongoing AND present > 4 weeks)  Answer Assessment - Initial Assessment Questions 1. DESCRIPTION: Describe how you are feeling.     Lethargy  2. SEVERITY: How bad is it?  Can you stand and walk?     Moderate. Exhausted by the end of the day. Able to stand and walk.   3. ONSET: When did these symptoms begin? (e.g., hours, days, weeks, months)     Ongoing the past year, worse over the past week  4. CAUSE: What do you think is causing the weakness or fatigue? (e.g., not drinking enough fluids, medical problem, trouble sleeping)     Unsure  5. NEW MEDICINES:  Have you started on any new medicines recently? (e.g., opioid pain medicines, benzodiazepines, muscle relaxants, antidepressants, antihistamines, neuroleptics, beta blockers)     Denies  6. OTHER SYMPTOMS: Do you have any other symptoms? (e.g., chest pain, fever, cough, SOB, vomiting, diarrhea, bleeding, other areas of pain)     Also reports glucose has been persistently in the 100-130's for a little while when she gets her lab work drawn. Has had cough with congestion for several weeks since November. Kids recently had RSV. Went to the ED and was told she had a sinus infection.  7. PREGNANCY: Is there any chance you are pregnant? When  was your last menstrual period?     Denies. Had negative pregnancy test yesterday.  Protocols used: Weakness (Generalized) and Fatigue-A-AH

## 2025-01-13 ENCOUNTER — Emergency Department (HOSPITAL_COMMUNITY)
Admission: EM | Admit: 2025-01-13 | Discharge: 2025-01-14 | Discharge status: Against Medical Advice | Source: Ambulatory Visit | Attending: Emergency Medicine | Admitting: Emergency Medicine

## 2025-01-13 DIAGNOSIS — Z5321 Procedure and treatment not carried out due to patient leaving prior to being seen by health care provider: Secondary | ICD-10-CM | POA: Insufficient documentation

## 2025-01-13 DIAGNOSIS — R030 Elevated blood-pressure reading, without diagnosis of hypertension: Secondary | ICD-10-CM | POA: Diagnosis not present

## 2025-01-13 DIAGNOSIS — R2243 Localized swelling, mass and lump, lower limb, bilateral: Secondary | ICD-10-CM | POA: Insufficient documentation

## 2025-01-13 DIAGNOSIS — R531 Weakness: Secondary | ICD-10-CM | POA: Diagnosis present

## 2025-01-13 NOTE — ED Triage Notes (Signed)
 C/o generalized weakness - seen 10 days ago for same. Heaviness is now in both legs. Came here from the dentist due to having elevated radial blood pressures of 127/84 and 141/89.

## 2025-01-19 ENCOUNTER — Ambulatory Visit: Admitting: Student in an Organized Health Care Education/Training Program

## 2025-01-19 ENCOUNTER — Encounter: Payer: Self-pay | Admitting: Student in an Organized Health Care Education/Training Program

## 2025-01-19 VITALS — BP 128/77 | HR 86 | Temp 98.4°F | Ht 64.0 in | Wt 164.2 lb

## 2025-01-19 DIAGNOSIS — D649 Anemia, unspecified: Secondary | ICD-10-CM | POA: Diagnosis not present

## 2025-01-19 DIAGNOSIS — R7989 Other specified abnormal findings of blood chemistry: Secondary | ICD-10-CM | POA: Insufficient documentation

## 2025-01-19 DIAGNOSIS — F411 Generalized anxiety disorder: Secondary | ICD-10-CM | POA: Diagnosis not present

## 2025-01-19 LAB — VITAMIN B12: Vitamin B-12: 566 pg/mL (ref 211–911)

## 2025-01-19 LAB — CBC
HCT: 35.1 % — ABNORMAL LOW (ref 36.0–46.0)
Hemoglobin: 11.4 g/dL — ABNORMAL LOW (ref 12.0–15.0)
MCHC: 32.3 g/dL (ref 30.0–36.0)
MCV: 78.1 fl (ref 78.0–100.0)
Platelets: 263 K/uL (ref 150.0–400.0)
RBC: 4.5 Mil/uL (ref 3.87–5.11)
RDW: 15.9 % — ABNORMAL HIGH (ref 11.5–15.5)
WBC: 4.7 K/uL (ref 4.0–10.5)

## 2025-01-19 LAB — COMPREHENSIVE METABOLIC PANEL WITH GFR
ALT: 16 U/L (ref 3–35)
AST: 20 U/L (ref 5–37)
Albumin: 4.2 g/dL (ref 3.5–5.2)
Alkaline Phosphatase: 57 U/L (ref 39–117)
BUN: 9 mg/dL (ref 6–23)
CO2: 28 meq/L (ref 19–32)
Calcium: 9.3 mg/dL (ref 8.4–10.5)
Chloride: 106 meq/L (ref 96–112)
Creatinine, Ser: 0.51 mg/dL (ref 0.40–1.20)
GFR: 128.48 mL/min
Glucose, Bld: 114 mg/dL — ABNORMAL HIGH (ref 70–99)
Potassium: 4.2 meq/L (ref 3.5–5.1)
Sodium: 138 meq/L (ref 135–145)
Total Bilirubin: 0.3 mg/dL (ref 0.2–1.2)
Total Protein: 7.5 g/dL (ref 6.0–8.3)

## 2025-01-19 LAB — IBC + FERRITIN
Ferritin: 2.2 ng/mL — ABNORMAL LOW (ref 10.0–291.0)
Iron: 34 ug/dL — ABNORMAL LOW (ref 42–145)
Saturation Ratios: 6.3 % — ABNORMAL LOW (ref 20.0–50.0)
TIBC: 540.4 ug/dL — ABNORMAL HIGH (ref 250.0–450.0)
Transferrin: 386 mg/dL — ABNORMAL HIGH (ref 212.0–360.0)

## 2025-01-19 LAB — TSH: TSH: 1.49 u[IU]/mL (ref 0.35–5.50)

## 2025-01-19 MED ORDER — SERTRALINE HCL 50 MG PO TABS: 50.0000 mg | ORAL_TABLET | Freq: Every day | ORAL | 3 refills | Status: AC

## 2025-01-19 NOTE — Patient Instructions (Signed)
" °  VISIT SUMMARY: Today, we discussed your episodes of dizziness and leg heaviness, along with your history of anxiety, depression, anemia, and heavy menstrual bleeding. We also reviewed your shoulder injury and current home exercise routine.  YOUR PLAN: -DIZZINESS AND LOWER EXTREMITY WEAKNESS: Your dizziness and leg weakness may be due to an electrolyte imbalance, anxiety, or other causes. We have ordered blood tests to check your thyroid  function, iron  levels, B12 levels, and liver enzymes to help determine the cause.  -GENERALIZED ANXIETY DISORDER WITH DEPRESSIVE SYMPTOMS: Generalized anxiety disorder is a condition characterized by persistent and excessive worry about various aspects of life. We discussed the benefits of Zoloft , and you have been prescribed 50 mg daily. We will follow up in 3-4 weeks to see how you are responding to the medication.  -IRON  DEFICIENCY ANEMIA: Iron  deficiency anemia is a condition where your body lacks enough iron  to produce healthy red blood cells. Your hemoglobin level is 11 g/dL, which may be due to your heavy menstrual bleeding. We will monitor your hemoglobin levels and symptoms of anemia.  -MENORRHAGIA: Menorrhagia is heavy menstrual bleeding. Given your family history of endometriosis and your symptoms, we will monitor your menstrual symptoms and consider further evaluation if they worsen.  -HISTORY OF ROTATOR CUFF INJURY AND TENDINITIS, RIGHT SHOULDER: You have a history of a rotator cuff injury and tendinitis in your right shoulder. Continue your home exercise program to maintain shoulder function and rehabilitation.  INSTRUCTIONS: Please complete the blood tests as ordered. Start taking Zoloft  50 mg daily and monitor how you feel. We will follow up in 3-4 weeks to assess your response to the medication. Continue your home exercises for your shoulder. If your menstrual symptoms worsen, please let us  know for further evaluation.    Contains text generated  by [employer].   "

## 2025-01-19 NOTE — Assessment & Plan Note (Signed)
 Incidental finding on recent blood work with an ALT of 50 but the rest of the liver enzymes looked normal.  Platelets normal.  No history of liver disease.  No medication exposures to explain it.  May be related to mildly overweight, BMI of 28.  Will recheck liver enzymes today along with iron  studies.

## 2025-01-19 NOTE — Assessment & Plan Note (Signed)
 Chronic intermittent issue, but symptoms seem to be much worse recently.  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  We talked about how her anxiety may be manifesting with somatic discomfort and symptoms like her intermittent dizziness and fatigue.  He is open to the idea of treatment.  Reports using Zoloft  in the past with good benefits.  She reports some intrusive thoughts of passive suicidality.  Overall low risk with no plan, lots of protective factors, she says she would never actually hurt herself because of her responsibility with her children.  Her mother had bipolar disease and she saw what happened when her mother took her own life.  I think is low risk that she has bipolar disease based on how functional she is and how well she is done over the last 10 years without medications.  We decided to start Zoloft , 50 mg daily, close follow-up in 4 weeks, and I recommended that she discontinue the medication if she has worsening anxiety while using it.
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
## 2025-01-19 NOTE — Assessment & Plan Note (Signed)
 Recent history of fatigue and dizziness.  Exam is reassuring.  Reports a long history of severe anemias.  Still having monthly periods with moderate menorrhagia.  Will check iron  levels, B12 levels.  CBC recently showed a mild normocytic anemia with a hemoglobin of 11 g.  Her symptoms of fatigue and dizziness I think are multifactorial, with some component of anxiety contributing.  Orthostatic blood pressures were reassuring today.  Will thyroid  function too.

## 2025-01-20 ENCOUNTER — Ambulatory Visit: Payer: Self-pay | Admitting: Student in an Organized Health Care Education/Training Program

## 2025-01-20 DIAGNOSIS — E611 Iron deficiency: Secondary | ICD-10-CM | POA: Insufficient documentation

## 2025-01-20 MED ORDER — POLYSACCHARIDE IRON COMPLEX 150 MG PO CAPS: 150.0000 mg | ORAL_CAPSULE | Freq: Every day | ORAL | 1 refills | Status: AC

## 2025-01-20 NOTE — Progress Notes (Signed)
 Called patient to schedule lab only visit in 4 weeks. Lvmtrc. Please schedule patient if she calls back

## 2025-01-21 NOTE — Progress Notes (Signed)
 Called patient to schedule lab only visit in 4 weeks. Lvmtrc. Please schedule patient if she calls back

## 2025-02-19 ENCOUNTER — Other Ambulatory Visit

## 2025-02-19 ENCOUNTER — Ambulatory Visit: Admitting: Student in an Organized Health Care Education/Training Program
